# Patient Record
Sex: Male | Born: 1950
Health system: Southern US, Community
[De-identification: ages and names within clinical notes are randomized; demographics above are authoritative.]

## PROBLEM LIST (undated history)

## (undated) ENCOUNTER — Emergency Department (HOSPITAL_COMMUNITY): Admission: EM | Payer: 59 | Source: Home / Self Care

## (undated) DIAGNOSIS — E119 Type 2 diabetes mellitus without complications: Secondary | ICD-10-CM

## (undated) DIAGNOSIS — E039 Hypothyroidism, unspecified: Secondary | ICD-10-CM

## (undated) DIAGNOSIS — I371 Nonrheumatic pulmonary valve insufficiency: Secondary | ICD-10-CM

## (undated) DIAGNOSIS — I10 Essential (primary) hypertension: Secondary | ICD-10-CM

## (undated) DIAGNOSIS — Z954 Presence of other heart-valve replacement: Secondary | ICD-10-CM

## (undated) DIAGNOSIS — E079 Disorder of thyroid, unspecified: Secondary | ICD-10-CM

## (undated) DIAGNOSIS — M1990 Unspecified osteoarthritis, unspecified site: Secondary | ICD-10-CM

## (undated) DIAGNOSIS — I330 Acute and subacute infective endocarditis: Secondary | ICD-10-CM

## (undated) HISTORY — PX: TONSILLECTOMY: SUR1361

## (undated) HISTORY — DX: Nonrheumatic pulmonary valve insufficiency: I37.1

## (undated) HISTORY — PX: COLONOSCOPY: SHX174

## (undated) HISTORY — DX: Acute and subacute infective endocarditis: I33.0

## (undated) HISTORY — PX: APPENDECTOMY: SHX54

---

## 2000-03-09 ENCOUNTER — Emergency Department (HOSPITAL_COMMUNITY): Admission: EM | Admit: 2000-03-09 | Discharge: 2000-03-09 | Payer: Self-pay | Admitting: Emergency Medicine

## 2000-03-09 ENCOUNTER — Encounter: Payer: Self-pay | Admitting: Emergency Medicine

## 2000-03-23 ENCOUNTER — Emergency Department (HOSPITAL_COMMUNITY): Admission: EM | Admit: 2000-03-23 | Discharge: 2000-03-23 | Payer: Self-pay | Admitting: Emergency Medicine

## 2014-02-12 ENCOUNTER — Inpatient Hospital Stay (HOSPITAL_COMMUNITY)
Admission: EM | Admit: 2014-02-12 | Discharge: 2014-02-19 | DRG: 871 | Disposition: A | Discharge status: Home Health | Payer: 59 | Attending: Internal Medicine | Admitting: Internal Medicine

## 2014-02-12 ENCOUNTER — Emergency Department (HOSPITAL_COMMUNITY): Payer: 59

## 2014-02-12 ENCOUNTER — Inpatient Hospital Stay (HOSPITAL_COMMUNITY): Payer: 59

## 2014-02-12 ENCOUNTER — Encounter (HOSPITAL_COMMUNITY): Payer: Self-pay | Admitting: Emergency Medicine

## 2014-02-12 DIAGNOSIS — E876 Hypokalemia: Secondary | ICD-10-CM | POA: Diagnosis present

## 2014-02-12 DIAGNOSIS — N170 Acute kidney failure with tubular necrosis: Secondary | ICD-10-CM | POA: Diagnosis present

## 2014-02-12 DIAGNOSIS — I330 Acute and subacute infective endocarditis: Secondary | ICD-10-CM

## 2014-02-12 DIAGNOSIS — E039 Hypothyroidism, unspecified: Secondary | ICD-10-CM

## 2014-02-12 DIAGNOSIS — E860 Dehydration: Secondary | ICD-10-CM | POA: Diagnosis present

## 2014-02-12 DIAGNOSIS — B953 Streptococcus pneumoniae as the cause of diseases classified elsewhere: Secondary | ICD-10-CM

## 2014-02-12 DIAGNOSIS — N179 Acute kidney failure, unspecified: Secondary | ICD-10-CM | POA: Diagnosis present

## 2014-02-12 DIAGNOSIS — Z79899 Other long term (current) drug therapy: Secondary | ICD-10-CM

## 2014-02-12 DIAGNOSIS — I2690 Septic pulmonary embolism without acute cor pulmonale: Secondary | ICD-10-CM | POA: Diagnosis present

## 2014-02-12 DIAGNOSIS — I10 Essential (primary) hypertension: Secondary | ICD-10-CM | POA: Diagnosis present

## 2014-02-12 DIAGNOSIS — R6520 Severe sepsis without septic shock: Secondary | ICD-10-CM

## 2014-02-12 DIAGNOSIS — R6521 Severe sepsis with septic shock: Secondary | ICD-10-CM

## 2014-02-12 DIAGNOSIS — I2699 Other pulmonary embolism without acute cor pulmonale: Secondary | ICD-10-CM | POA: Diagnosis present

## 2014-02-12 DIAGNOSIS — A403 Sepsis due to Streptococcus pneumoniae: Principal | ICD-10-CM | POA: Diagnosis present

## 2014-02-12 DIAGNOSIS — R222 Localized swelling, mass and lump, trunk: Secondary | ICD-10-CM | POA: Diagnosis present

## 2014-02-12 DIAGNOSIS — D72819 Decreased white blood cell count, unspecified: Secondary | ICD-10-CM | POA: Diagnosis present

## 2014-02-12 DIAGNOSIS — A419 Sepsis, unspecified organism: Secondary | ICD-10-CM | POA: Diagnosis present

## 2014-02-12 DIAGNOSIS — R7881 Bacteremia: Secondary | ICD-10-CM | POA: Diagnosis present

## 2014-02-12 HISTORY — DX: Essential (primary) hypertension: I10

## 2014-02-12 HISTORY — DX: Disorder of thyroid, unspecified: E07.9

## 2014-02-12 HISTORY — DX: Unspecified osteoarthritis, unspecified site: M19.90

## 2014-02-12 HISTORY — DX: Hypothyroidism, unspecified: E03.9

## 2014-02-12 LAB — HEPATIC FUNCTION PANEL
ALT: 24 U/L (ref 0–53)
AST: 35 U/L (ref 0–37)
Albumin: 2.4 g/dL — ABNORMAL LOW (ref 3.5–5.2)
Alkaline Phosphatase: 66 U/L (ref 39–117)
Bilirubin, Direct: <0.2 mg/dL (ref 0.0–0.3)
TOTAL PROTEIN: 6.3 g/dL (ref 6.0–8.3)
Total Bilirubin: 0.5 mg/dL (ref 0.3–1.2)

## 2014-02-12 LAB — URINALYSIS, ROUTINE W REFLEX MICROSCOPIC
GLUCOSE, UA: NEGATIVE mg/dL
Ketones, ur: 15 mg/dL — AB
Leukocytes, UA: NEGATIVE
Nitrite: NEGATIVE
PROTEIN: 100 mg/dL — AB
Specific Gravity, Urine: 1.022 (ref 1.005–1.030)
Urobilinogen, UA: 0.2 mg/dL (ref 0.0–1.0)
pH: 5 (ref 5.0–8.0)

## 2014-02-12 LAB — CBC
HEMATOCRIT: 39.3 % (ref 39.0–52.0)
Hemoglobin: 14 g/dL (ref 13.0–17.0)
MCH: 31 pg (ref 26.0–34.0)
MCHC: 35.6 g/dL (ref 30.0–36.0)
MCV: 86.9 fL (ref 78.0–100.0)
Platelets: 161 10*3/uL (ref 150–400)
RBC: 4.52 MIL/uL (ref 4.22–5.81)
RDW: 13.8 % (ref 11.5–15.5)
WBC: 28.2 10*3/uL — AB (ref 4.0–10.5)

## 2014-02-12 LAB — D-DIMER, QUANTITATIVE (NOT AT ARMC): D DIMER QUANT: 3.42 ug{FEU}/mL — AB (ref 0.00–0.48)

## 2014-02-12 LAB — BASIC METABOLIC PANEL
BUN: 35 mg/dL — AB (ref 6–23)
CO2: 25 mEq/L (ref 19–32)
CREATININE: 1.8 mg/dL — AB (ref 0.50–1.35)
Calcium: 9.2 mg/dL (ref 8.4–10.5)
Chloride: 97 mEq/L (ref 96–112)
GFR calc non Af Amer: 38 mL/min — ABNORMAL LOW (ref >90)
GFR, EST AFRICAN AMERICAN: 44 mL/min — AB (ref >90)
Glucose, Bld: 155 mg/dL — ABNORMAL HIGH (ref 70–99)
Potassium: 3.3 mEq/L — ABNORMAL LOW (ref 3.7–5.3)
Sodium: 139 mEq/L (ref 137–147)

## 2014-02-12 LAB — URINE MICROSCOPIC-ADD ON

## 2014-02-12 LAB — ETHANOL

## 2014-02-12 LAB — TROPONIN I

## 2014-02-12 LAB — I-STAT TROPONIN, ED: Troponin i, poc: 0.02 ng/mL (ref 0.00–0.08)

## 2014-02-12 LAB — PROTIME-INR
INR: 1.31 (ref 0.00–1.49)
Prothrombin Time: 16 seconds — ABNORMAL HIGH (ref 11.6–15.2)

## 2014-02-12 LAB — LIPASE, BLOOD: Lipase: 26 U/L (ref 11–59)

## 2014-02-12 LAB — I-STAT CG4 LACTIC ACID, ED: Lactic Acid, Venous: 2.3 mmol/L — ABNORMAL HIGH (ref 0.5–2.2)

## 2014-02-12 LAB — AMYLASE: AMYLASE: 28 U/L (ref 0–105)

## 2014-02-12 LAB — PRO B NATRIURETIC PEPTIDE: PRO B NATRI PEPTIDE: 1062 pg/mL — AB (ref 0–125)

## 2014-02-12 LAB — PHOSPHORUS: Phosphorus: 2.3 mg/dL (ref 2.3–4.6)

## 2014-02-12 LAB — URIC ACID: URIC ACID, SERUM: 7.9 mg/dL — AB (ref 4.0–7.8)

## 2014-02-12 LAB — TSH: TSH: 7.81 u[IU]/mL — ABNORMAL HIGH (ref 0.350–4.500)

## 2014-02-12 LAB — APTT: APTT: 35 s (ref 24–37)

## 2014-02-12 LAB — CK: Total CK: 547 U/L — ABNORMAL HIGH (ref 7–232)

## 2014-02-12 LAB — MAGNESIUM: MAGNESIUM: 2.2 mg/dL (ref 1.5–2.5)

## 2014-02-12 LAB — PROCALCITONIN: Procalcitonin: >175 ng/mL

## 2014-02-12 MED ORDER — NOREPINEPHRINE BITARTRATE 1 MG/ML IJ SOLN: 2.0000 ug/min | Freq: Once | INTRAVENOUS | Status: DC

## 2014-02-12 MED ORDER — NOREPINEPHRINE BITARTRATE 1 MG/ML IJ SOLN
2.0000 ug/min | Freq: Once | INTRAVENOUS | Status: AC
  Administered 2014-02-12: 5 ug/min via INTRAVENOUS
  Filled 2014-02-12: qty 4

## 2014-02-12 MED ORDER — POTASSIUM CHLORIDE 10 MEQ/100ML IV SOLN
10.0000 meq | INTRAVENOUS | Status: DC
  Administered 2014-02-12: 10 meq via INTRAVENOUS
  Filled 2014-02-12 (×2): qty 100

## 2014-02-12 MED ORDER — PIPERACILLIN-TAZOBACTAM 3.375 G IVPB
3.3750 g | Freq: Three times a day (TID) | INTRAVENOUS | Status: DC
  Administered 2014-02-12 – 2014-02-14 (×5): 3.375 g via INTRAVENOUS
  Filled 2014-02-12 (×9): qty 50

## 2014-02-12 MED ORDER — SODIUM CHLORIDE 0.9 % IV BOLUS (SEPSIS)
1000.0000 mL | Freq: Once | INTRAVENOUS | Status: AC
  Administered 2014-02-12: 1000 mL via INTRAVENOUS

## 2014-02-12 MED ORDER — NOREPINEPHRINE BITARTRATE 1 MG/ML IJ SOLN
2.0000 ug/min | INTRAVENOUS | Status: DC
  Administered 2014-02-12: 5 ug/min via INTRAVENOUS
  Filled 2014-02-12: qty 4

## 2014-02-12 MED ORDER — SODIUM CHLORIDE 0.9 % IV BOLUS (SEPSIS): 2000.0000 mL | Freq: Once | INTRAVENOUS | Status: DC

## 2014-02-12 MED ORDER — DEXTROSE 5 % IV SOLN
1.0000 g | Freq: Once | INTRAVENOUS | Status: AC
  Administered 2014-02-12: 1 g via INTRAVENOUS
  Filled 2014-02-12: qty 10

## 2014-02-12 MED ORDER — SODIUM CHLORIDE 0.9 % IV BOLUS (SEPSIS)
2000.0000 mL | Freq: Once | INTRAVENOUS | Status: AC
  Administered 2014-02-13: 2000 mL via INTRAVENOUS

## 2014-02-12 MED ORDER — DEXTROSE 5 % IV SOLN
500.0000 mg | Freq: Once | INTRAVENOUS | Status: AC
  Administered 2014-02-12: 500 mg via INTRAVENOUS

## 2014-02-12 MED ORDER — SODIUM CHLORIDE 0.9 % IV SOLN: 250.0000 mL | INTRAVENOUS | Status: DC | PRN

## 2014-02-12 MED ORDER — HEPARIN SODIUM (PORCINE) 5000 UNIT/ML IJ SOLN
5000.0000 [IU] | Freq: Three times a day (TID) | INTRAMUSCULAR | Status: DC
  Administered 2014-02-13 – 2014-02-16 (×11): 5000 [IU] via SUBCUTANEOUS
  Filled 2014-02-12 (×14): qty 1

## 2014-02-12 MED ORDER — FAMOTIDINE IN NACL 20-0.9 MG/50ML-% IV SOLN
20.0000 mg | Freq: Two times a day (BID) | INTRAVENOUS | Status: DC
  Administered 2014-02-13: 20 mg via INTRAVENOUS
  Filled 2014-02-12 (×3): qty 50

## 2014-02-12 MED ORDER — ACETAMINOPHEN 325 MG PO TABS
650.0000 mg | ORAL_TABLET | Freq: Once | ORAL | Status: AC
  Administered 2014-02-12: 650 mg via ORAL
  Filled 2014-02-12: qty 2

## 2014-02-12 NOTE — H&P (Signed)
PULMONARY / CRITICAL CARE MEDICINE   Name: Jeffrey Guerrero MRN: [id number] DOB: Feb 23, 1951    ADMISSION DATE:  02/12/2014 CONSULTATION DATE:  02/12/14  REFERRING MD :  ED PRIMARY SERVICE: PCCM  CHIEF COMPLAINT:  Syncope, concern for sepsis  BRIEF PATIENT DESCRIPTION: 63 y.o. M, presented to ED after syncopal episode at home.   Initially stable; however, became hypotensive and febrile while in ED.  Code sepsis called, PCCM asked to admit.  SIGNIFICANT EVENTS / STUDIES:  4/27 - admit.  4/27- Head CT  >>> neg. 4/27-  Abd CT >>> neg  LINES / TUBES: PIV 4/27>>> CVL 4/27>>> A line 4/27>>>  CULTURES: Blood 4/27 >>> Urine 4/27 >>> Respiratory visus panel>>> sputum culture 4/27>>> Urine legionella Urine strep  ANTIBIOTICS: Vanc 4/27 >>>  Zosyn 4/27 >>> Rocephin 4/27>>> one dose Azithromycine 4/27>>> one dose   HISTORY OF PRESENT ILLNESS:   Jeffrey Guerrero is a 63 y.o. M with PMH of HTN and hypothyroidism, who presented 4/27 after a syncopal episode at home.  He states that he was in his normal state of health and was reaching for something in his refrigerator when he suddenly had syncopal episode with positive LOC.  Daughter proceeded to bring him to the ED for further evaluation. Patient did not lose control of bladder or bowel movement. Denies shortness of breath, chest pain. She has a mild cough without sputum production. No symptoms for UTI. No nausea, vomiting, diarrhea or abdominal pain. In ED, head CT was negative for acute process.  Pt was initially stable, but then he became hypotensive and febrile to 103.  BP did not respond to fluids and Levophed was started, code sepsis called. PCCM was asked for admission.    PAST MEDICAL HISTORY :  Past Medical History  Diagnosis Date  . Hypertension   . Thyroid disease    History reviewed. No pertinent past surgical history. Prior to Admission medications   Medication Sig Start Date End Date Taking? Authorizing Provider  amLODipine  (NORVASC) 5 MG tablet Take 5 mg by mouth daily.   Yes Historical Provider, MD  levothyroxine (SYNTHROID, LEVOTHROID) 100 MCG tablet Take 100 mcg by mouth daily before breakfast.   Yes Historical Provider, MD  Vitamin D, Ergocalciferol, (DRISDOL) 50000 UNITS CAPS capsule Take 50,000 Units by mouth every 7 (seven) days. Wednesday   Yes Historical Provider, MD   No Known Allergies  FAMILY HISTORY:  History reviewed. No pertinent family history. SOCIAL HISTORY:  reports that he has never smoked. He does not have any smokeless tobacco history on file. He reports that he drinks alcohol. He reports that he does not use illicit drugs.  REVIEW OF SYSTEMS:   ROS: General: has fever. Skin: no rash HEENT: no blurry vision, hearing changes or sore throat Pulm: has mild cough, no dyspnea or wheezing CV: no chest pain, palpitations, shortness of breath Abd: no nausea/vomiting, abdominal pain, diarrhea/constipation GU: no dysuria, hematuria, polyuria Ext: no arthralgias, myalgias Neuro: no weakness, numbness, or tingling  SUBJECTIVE:   VITAL SIGNS: Temp:  [97.6 F (36.4 C)-102.9 F (39.4 C)] 102.9 F (39.4 C) (04/27 1907) Pulse Rate:  [78-110] 79 (04/27 2200) Resp:  [16-30] 26 (04/27 2200) BP: (71-108)/(45-67) 91/62 mmHg (04/27 2200) SpO2:  [93 %-98 %] 96 % (04/27 2200) Weight:  [86.807 kg (191 lb 6 oz)] 86.807 kg (191 lb 6 oz) (04/27 1248) HEMODYNAMICS:   VENTILATOR SETTINGS:   INTAKE / OUTPUT: Intake/Output     04/27 0701 - 04/28 0700  I.V. (mL/kg) 3100 (35.7)   Total Intake(mL/kg) 3100 (35.7)   Net +3100         PHYSICAL EXAMINATION: General: NAD Neuro: oriented, Nonfocal, negative Brudzinski and Kernig sign, negative Babinski's HEENT: No JVD, no carotid artery bruit Cardiovascular: S1/S2, regular rhythm and rate, no murmur Lungs: To auscultation bilaterally Abdomen: Soft, nondistended, nontender, active bowel sounds Musculoskeletal: No leg edema or joint pain Skin: no  rashes  LABS:  - updated 6.55am 02/13/14 PULMONARY No results found for this basename: PHART, PCO2, PCO2ART, PO2, PO2ART, HCO3, TCO2, O2SAT,  in the last 168 hours  CBC  Recent Labs Lab 02/12/14 1304 02/13/14 0500  HGB 14.0 11.6*  HCT 39.3 33.4*  WBC 28.2* 21.9*  PLT 161 130*    COAGULATION  Recent Labs Lab 02/12/14 2212  INR 1.31    CARDIAC   Recent Labs Lab 02/12/14 2212 02/13/14 0412  TROPONINI <0.30 <0.30    Recent Labs Lab 02/12/14 2212  PROBNP 1062.0*     CHEMISTRY  Recent Labs Lab 02/12/14 1304 02/12/14 2212 02/13/14 0500  NA 139  --  138  K 3.3*  --  3.5*  CL 97  --  105  CO2 25  --  21  GLUCOSE 155*  --  157*  BUN 35*  --  32*  CREATININE 1.80*  --  1.39*  CALCIUM 9.2  --  7.3*  MG  --  2.2 2.3  PHOS  --  2.3 2.2*   Estimated Creatinine Clearance: 60.7 ml/min (by C-G formula based on Cr of 1.39).   LIVER  Recent Labs Lab 02/12/14 2212  AST 35  ALT 24  ALKPHOS 66  BILITOT 0.5  PROT 6.3  ALBUMIN 2.4*  INR 1.31     INFECTIOUS  Recent Labs Lab 02/12/14 1807 02/12/14 2212 02/13/14 0130  LATICACIDVEN 2.30*  --  1.1  PROCALCITON  --  >175.00  --      ENDOCRINE CBG (last 3)   Recent Labs  02/12/14 2313  GLUCAP 162*         IMAGING x48h  Ct Abdomen Pelvis Wo Contrast  02/12/2014   CLINICAL DATA:  Evaluate abdominal aortic aneurysm  EXAM: CT ABDOMEN AND PELVIS WITHOUT CONTRAST  TECHNIQUE: Multidetector CT imaging of the abdomen and pelvis was performed following the standard protocol without intravenous contrast.  COMPARISON:  None.  FINDINGS: The caliber of the abdominal aorta is normal and exhibits a normal tapered contour. There is mural calcification in the infrarenal aorta and common iliac arteries. The maximal diameter of the right common iliac artery is 1.5 cm. The maximal diameter of the left common iliac artery is 1.3 cm. The periaortic and pericaval soft tissues are normal in appearance.  The liver,  gallbladder, pancreas, spleen, and adrenal glands are normal in appearance. The kidneys exhibit no evidence of obstruction or of calcified stones. In the lateral aspect of the upper pole of the left kidney there is a hypodensity measuring approximately 1.8 x 2.1 cm which exhibits Hounsfield measurement of -4 and likely reflects a cyst. The perinephric fat is normal in density. The non-opacified loops of small and large bowel exhibit no evidence of ileus or obstruction or acute inflammation. A normal calibered, uninflamed-appearing appendix is demonstrated on images 64-70 of series 2. Within the pelvis the urinary bladder, prostate gland, and seminal vesicles exhibit no acute abnormalities. There is no inguinal nor significant umbilical hernia. The psoas musculature is normal in appearance. The lumbar vertebral bodies are preserved in  height. There are bilateral pars defects at L5 without evidence of spondylolisthesis. The bony pelvis exhibits no acute abnormalities. The lung bases are clear.  IMPRESSION: 1. There is no evidence of an abdominal aortic aneurysm. No iliac artery aneurysm or splenic artery aneurysm is demonstrated. The periaortic and pericaval regions appear normal. 2. There is no acute hepatobiliary abnormality nor acute urinary tract abnormality. A hypodensity in the upper pole of the left kidney likely reflects a cyst. 3. There is no acute bowel abnormality. 4. There are bilateral pars defects at L5 without evidence of spondylolisthesis.   Electronically Signed   By: David  Martinique   On: 02/12/2014 19:03   Dg Chest 2 View  02/12/2014   CLINICAL DATA:  STATUS POST FALL EARLIER TODAY, LOSS CONSCIOUSNESS  EXAM: CHEST  2 VIEW  COMPARISON:  None.  FINDINGS: The heart size and mediastinal contours are within normal limits. Both lungs are clear. The visualized skeletal structures are unremarkable.  IMPRESSION: No active cardiopulmonary disease.   Electronically Signed   By: Kathreen Devoid   On: 02/12/2014  18:51   Ct Head Wo Contrast  02/12/2014   CLINICAL DATA:  Loss of consciousness.  EXAM: CT HEAD WITHOUT CONTRAST  TECHNIQUE: Contiguous axial images were obtained from the base of the skull through the vertex without contrast.  COMPARISON:  None  FINDINGS: No evidence for acute hemorrhage, mass lesion, midline shift, hydrocephalus or large infarct. The visualized sinuses are clear. No acute bone abnormality.  IMPRESSION: No acute intracranial abnormality.   Electronically Signed   By: Markus Daft M.D.   On: 02/12/2014 16:05      ASSESSMENT / PLAN:  PULMONARY A: - mild cough, no sputum production-->aspiration?  - Negative chest x-ray  P:   -monitor closely  CARDIOVASCULAR A: - hx of hypertension, on amlodipine at home--> now septic shock - No chest pain or SOB - EKG: S1Q3T3 patter-->nonspecific for PE?  - D dimer 3.42-->no chest pain, normal O2 saturation--> less likely to have PE - 2E echo done in ED-->no right heart straining or pericardial effusion   - sTaff MD: very labile BP. OFf pressors as of 7am 02/13/14. S/p 6L fluids so far   P:  - Hold home amlodipine - Aggressive fluid resuscitation: 6 L NS followed by NS 150 cc/h (staff MD: give more fluids if needed) - Troponin cycle - LE doppler to rule out dvt - Levophed gtt (staff note: off shortly after arrival in ICU) - insert A line and CVL - check cortisol level  RENAL A:   Mild Acute Renal Insufficiency with mild high CK - No known hx of renal issue-->now AKI, BUN 35 and cre 1.80-->likely pre-renal vs ATN secondary to shock - No hydronephrosis on CT apt - HypoK, K=3.3  -UA negative for UTI, but positive RBC--> myoglobinuria?  P:   - replete K - check FeNa - check CK to rule out rhabdomyolysis - monitor CPK (Staff MD Note)  GASTROINTESTINAL A:   - No hx of GI issues P:   - Pepcide for PPx  HEMATOLOGIC A:   - Leukocytosis with WBC 28.2 in the setting of septic shock  P:  - F/u CBC - antibiotis as  below  INFECTIOUS A:  Septic Shock definite without source - no clear source of infection except for mild cough-->possible aspiration secondary to syncope - elevated lactic acid - PCT 175 P:   - IV zosyn and Vancomycin - start solumedrol q6h and pending cortisol level -  pan-culture ordered - check respiratory virus panel, HIV, UDS,  - trend lactic acid   ENDOCRINE A:   - No history of diabetes - History of hypothyroidism, TSH 7.810 on admission, in the acute illness, TSH may not be not accurate P:   - continue home Synthroid - monitor CBG, goal 140 to 180 - check cortisol level  NEUROLOGIC A:   - syncope-->unclear etiology, likely due to vasovagal or Orthostatic status. - no sign of meningitis or infectious encephalitis - CT head negative P:   - Monitor closely for mental stautus - monitor on tele for arhythmia? - neuro check q4h   Ivor Costa, MD PGY3, Internal Medicine Teaching Service Pager: [phone] 02/12/2014, 10:10 PM   STAFF NOTE: I, Dr Ann Lions have personally reviewed patient's available data, including medical history, events of note, physical examination and test results as part of my evaluation. I have discussed with resident/NP and other care providers such as pharmacist, RN and RRT.  In addition,  I personally evaluated patient and elicited key findings of septic shock, with mild acute renal insuffiency. Off pressors this morning. Mild high CPK will recheck. Mild renal insuf improving. Concerning for bacteremia; will repeat blood culture 02/13/14 am  Rest per NP/medical resident whose note is outlined above and that I agree with  The patient is critically ill with multiple organ systems failure and requires high complexity decision making for assessment and support, frequent evaluation and titration of therapies, application of advanced monitoring technologies and extensive interpretation of multiple databases.   Critical Care Time devoted to patient care  services described in this note is  35  Minutes.  Dr. Brand Males, M.D., [facility].C.P Pulmonary and Critical Care Medicine Staff Physician [facility] Pager: [phone], If no answer or between  15:00h - 7:00h: call [phone]  02/13/2014 7:01 AM

## 2014-02-12 NOTE — Progress Notes (Signed)
ANTIBIOTIC CONSULT NOTE - INITIAL  Pharmacy Consult for Zosyn Indication: possible aspiration pneumonia  No Known Allergies  Patient Measurements: Height: 6\' 1"  (185.4 cm) Weight: 191 lb 6 oz (86.807 kg) IBW/kg (Calculated) : 79.9  Vital Signs: Temp: 102.9 F (39.4 C) (04/27 1907) Temp src: Rectal (04/27 1907) BP: 91/62 mmHg (04/27 2200) Pulse Rate: 79 (04/27 2200) Intake/Output from previous day:   Intake/Output from this shift: Total I/O In: 3100 [I.V.:3100] Out: -   Labs:  Recent Labs  02/12/14 1304  WBC 28.2*  HGB 14.0  PLT 161  CREATININE 1.80*   Estimated Creatinine Clearance: 46.9 ml/min (by C-G formula based on Cr of 1.8). No results found for this basename: VANCOTROUGH, VANCOPEAK, VANCORANDOM, GENTTROUGH, GENTPEAK, GENTRANDOM, TOBRATROUGH, TOBRAPEAK, TOBRARND, AMIKACINPEAK, AMIKACINTROU, AMIKACIN,  in the last 72 hours   Microbiology: No results found for this or any previous visit (from the past 720 hour(s)).  Medical History: Past Medical History  Diagnosis Date  . Hypertension   . Thyroid disease     Medications:  See home med list Assessment: 63 year old man to be admitted to the hospital for sepsis, possible aspiration pneumonia.  Zosyn to start empirically.  Goal of Therapy:  treat infection  Plan:  Zosyn 3.375g IV q8h (infuse over 4 hours) Follow cultures and renal function  Gearldine Bienenstock Krystena Reitter 02/12/2014,10:24 PM

## 2014-02-12 NOTE — ED Notes (Signed)
Dr. Wyvonnia Dusky at the bedside. Verbalized need to start Levophed. Patient responsive. Denies pain, dizziness, or lightheadedness at the time.

## 2014-02-12 NOTE — ED Notes (Signed)
Rancour, Beaver, and Admitting Resident at the bedside.

## 2014-02-12 NOTE — ED Notes (Signed)
Pt reports that he thinks that he passed out this morning at home. States that he was getting some ice out of the freezer and woke up on the floor. States that he went to his PCP and was sent here. Pt with laceration between the eyes. Denies any headache. Skin warm and dry. A&Ox4.

## 2014-02-12 NOTE — ED Notes (Signed)
Resident Modena Nunnery verbalized to wait on Orthostatics till patient is stable. MD made aware of pressures and verbalized to keep watching patient.

## 2014-02-12 NOTE — ED Notes (Signed)
Patient transported to CT 

## 2014-02-12 NOTE — ED Provider Notes (Signed)
CSN: [id number]     Arrival date & time 02/12/14  1235 History   First MD Initiated Contact with Patient 02/12/14 1717     Chief Complaint  Patient presents with  . Loss of Consciousness     (Consider location/radiation/quality/duration/timing/severity/associated sxs/prior Treatment) The history is provided by the patient.   history of present illness: 63 year old male with history of hypertension and hypothyroidism who presents for a possible syncopal episode this morning.  Patient reports she was standing at his refrigerator and was about to get something out when he felt himself fall backwards.  He reports loss of consciousness.  He does not know how long it was until he woke up.  Patient's only complaint is that he has been feeling "tired" and he is thirsty.  He currently denies any fevers, chest pain, shortness of breath, abdominal pain, nausea, vomiting, diarrhea, constipation, dysuria.  Past Medical History  Diagnosis Date  . Hypertension   . Thyroid disease    History reviewed. No pertinent past surgical history. History reviewed. No pertinent family history. History  Substance Use Topics  . Smoking status: Never Smoker   . Smokeless tobacco: Not on file  . Alcohol Use: Yes    Review of Systems  Constitutional: Positive for fatigue. Negative for fever and chills.  HENT: Negative.   Eyes: Negative.   Respiratory: Negative for cough and shortness of breath.   Cardiovascular: Negative for chest pain and palpitations.  Gastrointestinal: Negative for nausea, vomiting, abdominal pain, diarrhea and constipation.  Genitourinary: Negative.   Musculoskeletal: Negative.   Skin: Negative.   Neurological: Positive for syncope.  All other systems reviewed and are negative.     Allergies  Review of patient's allergies indicates no known allergies.  Home Medications   Prior to Admission medications   Medication Sig Start Date End Date Taking? Authorizing Provider  amLODipine  (NORVASC) 5 MG tablet Take 5 mg by mouth daily.   Yes Historical Provider, MD  levothyroxine (SYNTHROID, LEVOTHROID) 100 MCG tablet Take 100 mcg by mouth daily before breakfast.   Yes Historical Provider, MD  Vitamin D, Ergocalciferol, (DRISDOL) 50000 UNITS CAPS capsule Take 50,000 Units by mouth every 7 (seven) days. Wednesday   Yes Historical Provider, MD   BP 90/58  Pulse 73  Temp(Src) 98.4 F (36.9 C) (Oral)  Resp 25  Ht 6\' 1"  (1.854 m)  Wt 191 lb 6 oz (86.807 kg)  BMI 25.25 kg/m2  SpO2 97% Physical Exam  Nursing note and vitals reviewed. Constitutional: He is oriented to person, place, and time. He appears well-developed and well-nourished. No distress.  HENT:  Head: Normocephalic and atraumatic.  Eyes: Conjunctivae are normal.  Neck: Neck supple.  Cardiovascular: Regular rhythm, normal heart sounds and intact distal pulses.  Tachycardia present.   Pulmonary/Chest: Effort normal and breath sounds normal. He has no wheezes. He has no rales.  Abdominal: Soft. He exhibits no distension. There is no tenderness.  Musculoskeletal: Normal range of motion.  Neurological: He is alert and oriented to person, place, and time.  Skin: Skin is warm and dry.    ED Course  Procedures (including critical care time) Labs Review Labs Reviewed  CBC - Abnormal; Notable for the following:    WBC 28.2 (*)    All other components within normal limits  BASIC METABOLIC PANEL - Abnormal; Notable for the following:    Potassium 3.3 (*)    Glucose, Bld 155 (*)    BUN 35 (*)    Creatinine, Ser  1.80 (*)    GFR calc non Af Amer 38 (*)    GFR calc Af Amer 44 (*)    All other components within normal limits  URINALYSIS, ROUTINE W REFLEX MICROSCOPIC - Abnormal; Notable for the following:    Color, Urine AMBER (*)    APPearance CLOUDY (*)    Hgb urine dipstick LARGE (*)    Bilirubin Urine SMALL (*)    Ketones, ur 15 (*)    Protein, ur 100 (*)    All other components within normal limits  TSH -  Abnormal; Notable for the following:    TSH 7.810 (*)    All other components within normal limits  D-DIMER, QUANTITATIVE - Abnormal; Notable for the following:    D-Dimer, Quant 3.42 (*)    All other components within normal limits  URINE MICROSCOPIC-ADD ON - Abnormal; Notable for the following:    Bacteria, UA FEW (*)    Casts HYALINE CASTS (*)    All other components within normal limits  I-STAT CG4 LACTIC ACID, ED - Abnormal; Notable for the following:    Lactic Acid, Venous 2.30 (*)    All other components within normal limits  CULTURE, BLOOD (ROUTINE X 2)  CULTURE, BLOOD (ROUTINE X 2)  URINE CULTURE  RESPIRATORY VIRUS PANEL  CULTURE, EXPECTORATED SPUTUM-ASSESSMENT  MAGNESIUM  PHOSPHORUS  AMYLASE  LIPASE, BLOOD  TROPONIN I  TROPONIN I  TROPONIN I  PROCALCITONIN  CORTISOL  PRO B NATRIURETIC PEPTIDE  PROTIME-INR  APTT  BASIC METABOLIC PANEL  MAGNESIUM  PHOSPHORUS  CBC  MYOGLOBIN, SERUM  HEPATIC FUNCTION PANEL  CK  SODIUM, URINE, RANDOM  CREATININE, URINE, RANDOM  LACTIC ACID, PLASMA  URINE RAPID DRUG SCREEN (HOSP PERFORMED)  ETHANOL  HIV ANTIBODY (ROUTINE TESTING)  URIC ACID  I-STAT TROPOININ, ED    Imaging Review Ct Abdomen Pelvis Wo Contrast  02/12/2014   CLINICAL DATA:  Evaluate abdominal aortic aneurysm  EXAM: CT ABDOMEN AND PELVIS WITHOUT CONTRAST  TECHNIQUE: Multidetector CT imaging of the abdomen and pelvis was performed following the standard protocol without intravenous contrast.  COMPARISON:  None.  FINDINGS: The caliber of the abdominal aorta is normal and exhibits a normal tapered contour. There is mural calcification in the infrarenal aorta and common iliac arteries. The maximal diameter of the right common iliac artery is 1.5 cm. The maximal diameter of the left common iliac artery is 1.3 cm. The periaortic and pericaval soft tissues are normal in appearance.  The liver, gallbladder, pancreas, spleen, and adrenal glands are normal in appearance. The  kidneys exhibit no evidence of obstruction or of calcified stones. In the lateral aspect of the upper pole of the left kidney there is a hypodensity measuring approximately 1.8 x 2.1 cm which exhibits Hounsfield measurement of -4 and likely reflects a cyst. The perinephric fat is normal in density. The non-opacified loops of small and large bowel exhibit no evidence of ileus or obstruction or acute inflammation. A normal calibered, uninflamed-appearing appendix is demonstrated on images 64-70 of series 2. Within the pelvis the urinary bladder, prostate gland, and seminal vesicles exhibit no acute abnormalities. There is no inguinal nor significant umbilical hernia. The psoas musculature is normal in appearance. The lumbar vertebral bodies are preserved in height. There are bilateral pars defects at L5 without evidence of spondylolisthesis. The bony pelvis exhibits no acute abnormalities. The lung bases are clear.  IMPRESSION: 1. There is no evidence of an abdominal aortic aneurysm. No iliac artery aneurysm or splenic artery  aneurysm is demonstrated. The periaortic and pericaval regions appear normal. 2. There is no acute hepatobiliary abnormality nor acute urinary tract abnormality. A hypodensity in the upper pole of the left kidney likely reflects a cyst. 3. There is no acute bowel abnormality. 4. There are bilateral pars defects at L5 without evidence of spondylolisthesis.   Electronically Signed   By: David  Martinique   On: 02/12/2014 19:03   Dg Chest 2 View  02/12/2014   CLINICAL DATA:  STATUS POST FALL EARLIER TODAY, LOSS CONSCIOUSNESS  EXAM: CHEST  2 VIEW  COMPARISON:  None.  FINDINGS: The heart size and mediastinal contours are within normal limits. Both lungs are clear. The visualized skeletal structures are unremarkable.  IMPRESSION: No active cardiopulmonary disease.   Electronically Signed   By: Kathreen Devoid   On: 02/12/2014 18:51   Ct Head Wo Contrast  02/12/2014   CLINICAL DATA:  Loss of  consciousness.  EXAM: CT HEAD WITHOUT CONTRAST  TECHNIQUE: Contiguous axial images were obtained from the base of the skull through the vertex without contrast.  COMPARISON:  None  FINDINGS: No evidence for acute hemorrhage, mass lesion, midline shift, hydrocephalus or large infarct. The visualized sinuses are clear. No acute bone abnormality.  IMPRESSION: No acute intracranial abnormality.   Electronically Signed   By: Markus Daft M.D.   On: 02/12/2014 16:05     EKG Interpretation   Date/Time:  Monday February 12 2014 12:51:53 EDT Ventricular Rate:  87 PR Interval:  152 QRS Duration: 92 QT Interval:  366 QTC Calculation: 440 R Axis:   74 Text Interpretation:  Normal sinus rhythm Possible Left atrial enlargement  Nonspecific ST abnormality Abnormal ECG No previous ECGs available  Confirmed by Wyvonnia Dusky  MD, STEPHEN [phone]) on 02/12/2014 6:03:39 PM      MDM   Final diagnoses:  Severe sepsis    63 year old male with history of hypertension and hypothyroidism who presents for a possible episode of syncope that occurred this morning.  Patient only complaining that he has felt increasingly tired.  He denies any other complaints.  At time of exam patient was slightly tachycardic with blood pressure 95/54.  Rectal temperature was obtained that showed patient to be febrile at 102.9.  Patient appeared dehydrated.  Labs obtained through triage were remarkable for a leukocytosis of 28,000.  Lactic acid 2.3.  CT of the head was negative.  Due to hypotension, CT of the abdomen was obtained that showed no signs of aneurysm or other acute abnormality.  UA negative for UTI.  Chest x-ray negative.  Patient now reporting some history of cough.  Concern for possible respiratory source and patient not showing signs on chest x-ray due to his dehydration.  Patient received total of 3 L IVF while in the emergency department.  Blood pressures continued to trend down to 72/55.  Patient appeared volume resuscitated at  that point and he was started on Levophed.    Patient was given Rocephin and azithromycin to cover for possible community-acquired pneumonia.  There was delay in patient receiving antibiotics do to patient developing sepsis while in the emergency department.  Critical care was consulted and will admit for further management.    Renaldo Reel, MD 02/12/14 2303

## 2014-02-12 NOTE — ED Notes (Signed)
Ordered to hold off on Orthostatic vital signs because pt blood pressure is low.

## 2014-02-12 NOTE — ED Notes (Signed)
Admitting MD at the bedside with the Resident gaining assessment.

## 2014-02-12 NOTE — ED Notes (Signed)
Pt vss reassessed, acuity changed dt elevated white count and low BP. PT remains AO. Denies being dizzy or lightheaded while in waiting area. Pt accompanied by family.

## 2014-02-13 ENCOUNTER — Inpatient Hospital Stay (HOSPITAL_COMMUNITY): Payer: 59

## 2014-02-13 ENCOUNTER — Encounter (HOSPITAL_COMMUNITY): Payer: Self-pay | Admitting: General Practice

## 2014-02-13 DIAGNOSIS — R6521 Severe sepsis with septic shock: Secondary | ICD-10-CM

## 2014-02-13 DIAGNOSIS — A419 Sepsis, unspecified organism: Secondary | ICD-10-CM

## 2014-02-13 DIAGNOSIS — N19 Unspecified kidney failure: Secondary | ICD-10-CM

## 2014-02-13 DIAGNOSIS — R6520 Severe sepsis without septic shock: Secondary | ICD-10-CM

## 2014-02-13 DIAGNOSIS — I369 Nonrheumatic tricuspid valve disorder, unspecified: Secondary | ICD-10-CM

## 2014-02-13 LAB — CK TOTAL AND CKMB (NOT AT ARMC)
CK, MB: 4.4 ng/mL — ABNORMAL HIGH (ref 0.3–4.0)
Relative Index: 0.9 (ref 0.0–2.5)
Total CK: 493 U/L — ABNORMAL HIGH (ref 7–232)

## 2014-02-13 LAB — MRSA PCR SCREENING: MRSA by PCR: NEGATIVE

## 2014-02-13 LAB — MAGNESIUM: MAGNESIUM: 2.3 mg/dL (ref 1.5–2.5)

## 2014-02-13 LAB — RAPID URINE DRUG SCREEN, HOSP PERFORMED
AMPHETAMINES: NOT DETECTED
Barbiturates: NOT DETECTED
Benzodiazepines: NOT DETECTED
Cocaine: NOT DETECTED
Opiates: NOT DETECTED
Tetrahydrocannabinol: NOT DETECTED

## 2014-02-13 LAB — DIFFERENTIAL
BASOS ABS: 0 10*3/uL (ref 0.0–0.1)
BASOS PCT: 0 % (ref 0–1)
EOS PCT: 0 % (ref 0–5)
Eosinophils Absolute: 0.1 10*3/uL (ref 0.0–0.7)
LYMPHS PCT: 4 % — AB (ref 12–46)
Lymphs Abs: 1 10*3/uL (ref 0.7–4.0)
MONOS PCT: 8 % (ref 3–12)
Monocytes Absolute: 2 10*3/uL — ABNORMAL HIGH (ref 0.1–1.0)
NEUTROS PCT: 88 % — AB (ref 43–77)
Neutro Abs: 23.8 10*3/uL — ABNORMAL HIGH (ref 1.7–7.7)
WBC Morphology: INCREASED

## 2014-02-13 LAB — PHOSPHORUS: PHOSPHORUS: 2.2 mg/dL — AB (ref 2.3–4.6)

## 2014-02-13 LAB — BASIC METABOLIC PANEL
BUN: 32 mg/dL — ABNORMAL HIGH (ref 6–23)
CO2: 21 mEq/L (ref 19–32)
Calcium: 7.3 mg/dL — ABNORMAL LOW (ref 8.4–10.5)
Chloride: 105 mEq/L (ref 96–112)
Creatinine, Ser: 1.39 mg/dL — ABNORMAL HIGH (ref 0.50–1.35)
GFR calc Af Amer: 60 mL/min — ABNORMAL LOW (ref >90)
GFR calc non Af Amer: 52 mL/min — ABNORMAL LOW (ref >90)
Glucose, Bld: 157 mg/dL — ABNORMAL HIGH (ref 70–99)
Potassium: 3.5 mEq/L — ABNORMAL LOW (ref 3.7–5.3)
SODIUM: 138 meq/L (ref 137–147)

## 2014-02-13 LAB — HIV ANTIBODY (ROUTINE TESTING W REFLEX): HIV: NONREACTIVE

## 2014-02-13 LAB — CBC
HCT: 33.4 % — ABNORMAL LOW (ref 39.0–52.0)
Hemoglobin: 11.6 g/dL — ABNORMAL LOW (ref 13.0–17.0)
MCH: 30.4 pg (ref 26.0–34.0)
MCHC: 34.7 g/dL (ref 30.0–36.0)
MCV: 87.4 fL (ref 78.0–100.0)
Platelets: 130 10*3/uL — ABNORMAL LOW (ref 150–400)
RBC: 3.82 MIL/uL — ABNORMAL LOW (ref 4.22–5.81)
RDW: 14.2 % (ref 11.5–15.5)
WBC: 21.9 10*3/uL — ABNORMAL HIGH (ref 4.0–10.5)

## 2014-02-13 LAB — GLUCOSE, CAPILLARY: Glucose-Capillary: 162 mg/dL — ABNORMAL HIGH (ref 70–99)

## 2014-02-13 LAB — SODIUM, URINE, RANDOM: SODIUM UR: 23 meq/L

## 2014-02-13 LAB — TROPONIN I: Troponin I: <0.3 ng/mL (ref <0.30)

## 2014-02-13 LAB — LACTIC ACID, PLASMA: LACTIC ACID, VENOUS: 1.1 mmol/L (ref 0.5–2.2)

## 2014-02-13 LAB — CREATININE, URINE, RANDOM: CREATININE, URINE: 140.47 mg/dL

## 2014-02-13 LAB — PROCALCITONIN: Procalcitonin: 141.17 ng/mL

## 2014-02-13 LAB — CORTISOL: CORTISOL PLASMA: 38.4 ug/dL

## 2014-02-13 MED ORDER — LEVOTHYROXINE SODIUM 100 MCG PO TABS: 100.0000 ug | ORAL_TABLET | Freq: Every day | ORAL | Status: DC

## 2014-02-13 MED ORDER — POTASSIUM PHOSPHATE DIBASIC 3 MMOLE/ML IV SOLN
10.0000 mmol | Freq: Once | INTRAVENOUS | Status: AC
  Administered 2014-02-13: 10 mmol via INTRAVENOUS
  Filled 2014-02-13: qty 3.33

## 2014-02-13 MED ORDER — SODIUM CHLORIDE 0.9 % IV SOLN
250.0000 mL | INTRAVENOUS | Status: DC
  Administered 2014-02-13: 14:00:00 via INTRAVENOUS
  Administered 2014-02-14: 1000 mL via INTRAVENOUS
  Administered 2014-02-15: 250 mL via INTRAVENOUS

## 2014-02-13 MED ORDER — LEVOTHYROXINE SODIUM 100 MCG PO TABS
100.0000 ug | ORAL_TABLET | Freq: Every day | ORAL | Status: DC
  Administered 2014-02-13 – 2014-02-17 (×5): 100 ug via ORAL
  Filled 2014-02-13 (×7): qty 1

## 2014-02-13 MED ORDER — VANCOMYCIN HCL IN DEXTROSE 750-5 MG/150ML-% IV SOLN
750.0000 mg | Freq: Once | INTRAVENOUS | Status: AC
  Administered 2014-02-13: 750 mg via INTRAVENOUS
  Filled 2014-02-13: qty 150

## 2014-02-13 MED ORDER — METHYLPREDNISOLONE SODIUM SUCC 125 MG IJ SOLR
60.0000 mg | Freq: Four times a day (QID) | INTRAMUSCULAR | Status: DC
  Administered 2014-02-13 (×2): 60 mg via INTRAVENOUS
  Filled 2014-02-13 (×7): qty 0.96

## 2014-02-13 MED ORDER — VITAMIN D (ERGOCALCIFEROL) 1.25 MG (50000 UNIT) PO CAPS
50000.0000 [IU] | ORAL_CAPSULE | ORAL | Status: DC
  Administered 2014-02-17: 50000 [IU] via ORAL
  Filled 2014-02-13: qty 1

## 2014-02-13 MED ORDER — POTASSIUM CHLORIDE CRYS ER 20 MEQ PO TBCR
40.0000 meq | EXTENDED_RELEASE_TABLET | Freq: Once | ORAL | Status: AC
  Administered 2014-02-13: 40 meq via ORAL
  Filled 2014-02-13: qty 2

## 2014-02-13 MED ORDER — VANCOMYCIN HCL IN DEXTROSE 750-5 MG/150ML-% IV SOLN
750.0000 mg | Freq: Two times a day (BID) | INTRAVENOUS | Status: DC
  Administered 2014-02-13 – 2014-02-15 (×4): 750 mg via INTRAVENOUS
  Filled 2014-02-13 (×6): qty 150

## 2014-02-13 NOTE — Procedures (Signed)
Arterial Catheter Insertion Procedure Note Pruitt Taboada [id number] [birth year]  Procedure: Insertion of Arterial Catheter  Indications: Blood pressure monitoring and Frequent blood sampling  Procedure Details Consent: Risks of procedure as well as the alternatives and risks of each were explained to the (patient/caregiver).  Consent for procedure obtained. Time Out: Verified patient identification, verified procedure, site/side was marked, verified correct patient position, special equipment/implants available, medications/allergies/relevent history reviewed, required imaging and test results available.  Performed  Maximum sterile technique was used including antiseptics, cap, gloves, gown, hand hygiene, mask and sheet. Skin prep: Chlorhexidine; local anesthetic administered 20 gauge catheter was inserted into right radial artery using the Seldinger technique.  Evaluation Blood flow good; BP tracing good. Complications: No apparent complications.   Wynelle Beckmann 02/13/2014

## 2014-02-13 NOTE — Progress Notes (Signed)
ANTIBIOTIC CONSULT NOTE - INITIAL  Pharmacy Consult for vancomycin Indication: rule out sepsis  No Known Allergies  Patient Measurements: Height: 6\' 1"  (185.4 cm) Weight: 192 lb 0.3 oz (87.1 kg) IBW/kg (Calculated) : 79.9  Vital Signs: Temp: 97.8 F (36.6 C) (04/27 2314) Temp src: Oral (04/27 2314) BP: 113/66 mmHg (04/27 2345) Pulse Rate: 66 (04/28 0045)  Labs:  Recent Labs  02/12/14 1304  WBC 28.2*  HGB 14.0  PLT 161  CREATININE 1.80*   Estimated Creatinine Clearance: 46.9 ml/min (by C-G formula based on Cr of 1.8).   Microbiology: No results found for this or any previous visit (from the past 720 hour(s)).  Medical History: Past Medical History  Diagnosis Date  . Hypertension   . Thyroid disease     Medications:  Prescriptions prior to admission  Medication Sig Dispense Refill  . amLODipine (NORVASC) 5 MG tablet Take 5 mg by mouth daily.      Marland Kitchen levothyroxine (SYNTHROID, LEVOTHROID) 100 MCG tablet Take 100 mcg by mouth daily before breakfast.      . Vitamin D, Ergocalciferol, (DRISDOL) 50000 UNITS CAPS capsule Take 50,000 Units by mouth every 7 (seven) days. Wednesday       Scheduled:  . famotidine (PEPCID) IV  20 mg Intravenous Q12H  . heparin  5,000 Units Subcutaneous 3 times per day  . methylPREDNISolone (SOLU-MEDROL) injection  60 mg Intravenous 4 times per day  . piperacillin-tazobactam (ZOSYN)  IV  3.375 g Intravenous 3 times per day  . potassium chloride  40 mEq Oral Once  . sodium chloride  2,000 mL Intravenous Once   Infusions:  . norepinephrine (LEVOPHED) Adult infusion Stopped (02/12/14 2345)    Assessment: 63yo male started on IV ABX to broaden coverage for septic shock, remains hypotensive.  Goal of Therapy:  Vancomycin trough level 15-20 mcg/ml  Plan:  Will begin vancomycin 750mg  IV Q12H and monitor CBC, Cx, levels prn.  Wynona Neat, PharmD, BCPS  02/13/2014,1:24 AM

## 2014-02-13 NOTE — Progress Notes (Signed)
VASCULAR LAB PRELIMINARY  PRELIMINARY  PRELIMINARY  PRELIMINARY  Bilateral lower extremity venous duplex  completed.    Preliminary report:  Bilateral:  No evidence of DVT, superficial thrombosis, or Baker's Cyst.    Nani Ravens, RVT 02/13/2014, 2:28 PM

## 2014-02-13 NOTE — Progress Notes (Signed)
  Echocardiogram 2D Echocardiogram has been performed.  Jeffrey Guerrero 02/13/2014, 2:32 PM

## 2014-02-13 NOTE — Progress Notes (Signed)
Pt admitted to the unit from 93M at 1800. Pt mental status is a&ox4. Pt oriented to room, staff, and call bell. Skin is intact.. Full assessment to be charted in [facility]. Call bell within reach. Visitor guidelines reviewed w/ pt and/or family.

## 2014-02-13 NOTE — ED Provider Notes (Signed)
I saw and evaluated the patient, reviewed the resident's note and I agree with the findings and plan. If applicable, I agree with the resident's interpretation of the EKG.  If applicable, I was present for critical portions of any procedures performed.  Syncopal episode without prodrome. No CP or SOB.  Neuro intact. Denies complaint.  Became hypotensive and febrile in the ED, minimal response to fluids.  Code sepsis called and levophed started. Cultures and antibiotics given. CXR and UA negative. No AAA. No R heart strain or pericardial effusion.  CRITICAL CARE Performed by: Ezequiel Essex Total critical care time: 30 Critical care time was exclusive of separately billable procedures and treating other patients. Critical care was necessary to treat or prevent imminent or life-threatening deterioration. Critical care was time spent personally by me on the following activities: development of treatment plan with patient and/or surrogate as well as nursing, discussions with consultants, evaluation of patient's response to treatment, examination of patient, obtaining history from patient or surrogate, ordering and performing treatments and interventions, ordering and review of laboratory studies, ordering and review of radiographic studies, pulse oximetry and re-evaluation of patient's condition.  BP 113/66  Pulse 66  Temp(Src) 97.8 F (36.6 C) (Oral)  Resp 16  Ht 6\' 1"  (1.854 m)  Wt 192 lb 0.3 oz (87.1 kg)  BMI 25.34 kg/m2  SpO2 97%   Ezequiel Essex, MD 02/13/14 [phone]

## 2014-02-13 NOTE — Care Management Note (Addendum)
    Page 1 of 2   02/19/2014     3:06:33 PM CARE MANAGEMENT NOTE 02/19/2014  Patient:  Jeffrey Guerrero, Jeffrey Guerrero   Account Number:  [id number]  Date Initiated:  02/13/2014  Documentation initiated by:  Elissa Hefty  Subjective/Objective Assessment:   adm w sepsis, endocarditis     Action/Plan:   lives w wife  picc placed- will need hh iv abx.   Anticipated DC Date:  02/19/2014   Anticipated DC Plan:  [place]  CM consult      [facility] Choice  HOME HEALTH   Choice offered to / List presented to:  C-1 Patient        [place] arranged  HH-1 RN      [place].   Status of service:  Completed, signed off Medicare Important Message given?  NO (If response is "NO", the following Medicare IM given date fields will be blank) Date Medicare IM given:   Date Additional Medicare IM given:    Discharge Disposition:  [place]  Per UR Regulation:  Reviewed for med. necessity/level of care/duration of stay  If discussed at [place] of Stay Meetings, dates discussed:    Comments:  02/19/14 [place], BSN [phone] patient for dc today, [place] notified, they also need script for abx, benefit chk done on eliquiis and xarelto, co pay for eliquis is $50 and xarelto is $35, patient states would like xarelto, informed MD.  [facility] [place]. NCM gave patient care path care which is 12 months free.  [facility] [address] [facility] and [place] does not.  02/16/14 [place], BSN [phone] patient had TEE today which shows pulmonary valve vegetation , patient will need home iv abx. patient chose [facility] , referral made to [facility] for [facility], soc will begin 24-48 hrs post discharge.

## 2014-02-13 NOTE — Clinical Documentation Improvement (Signed)
Possible Clinical Conditions?   Aspiration Pneumonia (POA?) Gram Negative Pneumonia (POA?) Bacterial pneumonia, specify type if known (POA?) Klebsiella PNA Staph/MRSA PNA Strep PNA Pneumococcal PNA Pneumonia (CAP, HAP) (POA?) Other Condition Cannot Clinically Determine   Supporting Information: Risk Factors:(As per notes)"septic shock" " aspiration PNA?" Signs & Symptoms (As per notes by PharmD) "possible aspiration pneumonia, pt on Zosyn"  Treatment:Zosyn 3.375g IV q8h (infuse over 4 hours)   Thank You, Alessandra Grout, RN, BSN, CCDS, Clinical Documentation Specialist:  [phone]   [phone]=[employer]- Health Information Management

## 2014-02-13 NOTE — Progress Notes (Signed)
PULMONARY / CRITICAL CARE MEDICINE   Name: Jeffrey Guerrero MRN: [id number] DOB: 12-31-1950    ADMISSION DATE:  02/12/2014  REFERRING MD : ED  PRIMARY SERVICE: PCCM   CHIEF COMPLAINT: Syncope, concern for sepsis   BRIEF PATIENT DESCRIPTION: 63 y.o. M, presented to ED after syncopal episode at home. Initially stable; however, became hypotensive and febrile while in ED. Code sepsis called, PCCM asked to admit.   SIGNIFICANT EVENTS / STUDIES:  4/27 - admit.  4/27- Head CT >>> neg 4/27- Abd CT >>> neg   LINES / TUBES:  PIV 4/27>>>  CVL 4/27>>>  A line 4/27>>>   CULTURES:  Blood 4/27 >>>  Urine 4/27 >>>  Respiratory visus panel>>>  sputum culture 4/27>>>  Urine legionella  Urine strep   ANTIBIOTICS:  Vanc 4/27 >>>  Zosyn 4/27 >>>  Rocephin 4/27>>> one dose  Azithromycin 4/27>>> one dose  INTERVAL HISTORY:  Off of Levophed since midnight last night (only required pressors for about 15 min).  BP stable s/p 6L of IVF since admission.  Patient denies pain/discomfort or symptoms of infection.  VITAL SIGNS: Temp:  [97.6 F (36.4 C)-102.9 F (39.4 C)] 97.6 F (36.4 C) (04/28 0341) Pulse Rate:  [60-110] 69 (04/28 0600) Resp:  [15-30] 15 (04/28 0600) BP: (71-132)/(45-103) 92/59 mmHg (04/28 0600) SpO2:  [93 %-98 %] 98 % (04/28 0600) Arterial Line BP: (85-118)/(43-63) 113/56 mmHg (04/28 0600) Weight:  [86.807 kg (191 lb 6 oz)-87.1 kg (192 lb 0.3 oz)] 87.1 kg (192 lb 0.3 oz) (04/28 0500)  HEMODYNAMICS:   VENTILATOR SETTINGS:   INTAKE / OUTPUT: Intake/Output     04/27 0701 - 04/28 0700   I.V. (mL/kg) 3890.3 (44.7)   IV Piggyback 1400   Total Intake(mL/kg) 5290.3 (60.7)   Urine (mL/kg/hr) 550   Total Output 550   Net +4740.3         PHYSICAL EXAMINATION: General:  In bed in NAD Neuro:  AAO x 3, responding appropriately HEENT:  WNL Cardiovascular:  RRR Lungs:  CTA B/L Abdomen:  +BS, soft, NT, ND Musculoskeletal:  Able to move all four extremities voluntarily Skin:   Intact   LABS:  CBC  Recent Labs Lab 02/12/14 1304 02/13/14 0500  WBC 28.2* 21.9*  HGB 14.0 11.6*  HCT 39.3 33.4*  PLT 161 130*   Coag's  Recent Labs Lab 02/12/14 2212  APTT 35  INR 1.31   BMET  Recent Labs Lab 02/12/14 1304 02/13/14 0500  NA 139 138  K 3.3* 3.5*  CL 97 105  CO2 25 21  BUN 35* 32*  CREATININE 1.80* 1.39*  GLUCOSE 155* 157*   Electrolytes  Recent Labs Lab 02/12/14 1304 02/12/14 2212 02/13/14 0500  CALCIUM 9.2  --  7.3*  MG  --  2.2 2.3  PHOS  --  2.3 2.2*   Sepsis Markers  Recent Labs Lab 02/12/14 1807 02/12/14 2212 02/13/14 0130  LATICACIDVEN 2.30*  --  1.1  PROCALCITON  --  >175.00  --    Liver Enzymes  Recent Labs Lab 02/12/14 2212  AST 35  ALT 24  ALKPHOS 66  BILITOT 0.5  ALBUMIN 2.4*   Cardiac Enzymes  Recent Labs Lab 02/12/14 2212 02/13/14 0412  TROPONINI <0.30 <0.30  PROBNP 1062.0*  --    Glucose  Recent Labs Lab 02/12/14 2313  GLUCAP 162*    Imaging Ct Abdomen Pelvis Wo Contrast  02/12/2014   CLINICAL DATA:  Evaluate abdominal aortic aneurysm  EXAM: CT ABDOMEN AND PELVIS WITHOUT  CONTRAST  TECHNIQUE: Multidetector CT imaging of the abdomen and pelvis was performed following the standard protocol without intravenous contrast.  COMPARISON:  None.  FINDINGS: The caliber of the abdominal aorta is normal and exhibits a normal tapered contour. There is mural calcification in the infrarenal aorta and common iliac arteries. The maximal diameter of the right common iliac artery is 1.5 cm. The maximal diameter of the left common iliac artery is 1.3 cm. The periaortic and pericaval soft tissues are normal in appearance.  The liver, gallbladder, pancreas, spleen, and adrenal glands are normal in appearance. The kidneys exhibit no evidence of obstruction or of calcified stones. In the lateral aspect of the upper pole of the left kidney there is a hypodensity measuring approximately 1.8 x 2.1 cm which exhibits Hounsfield  measurement of -4 and likely reflects a cyst. The perinephric fat is normal in density. The non-opacified loops of small and large bowel exhibit no evidence of ileus or obstruction or acute inflammation. A normal calibered, uninflamed-appearing appendix is demonstrated on images 64-70 of series 2. Within the pelvis the urinary bladder, prostate gland, and seminal vesicles exhibit no acute abnormalities. There is no inguinal nor significant umbilical hernia. The psoas musculature is normal in appearance. The lumbar vertebral bodies are preserved in height. There are bilateral pars defects at L5 without evidence of spondylolisthesis. The bony pelvis exhibits no acute abnormalities. The lung bases are clear.  IMPRESSION: 1. There is no evidence of an abdominal aortic aneurysm. No iliac artery aneurysm or splenic artery aneurysm is demonstrated. The periaortic and pericaval regions appear normal. 2. There is no acute hepatobiliary abnormality nor acute urinary tract abnormality. A hypodensity in the upper pole of the left kidney likely reflects a cyst. 3. There is no acute bowel abnormality. 4. There are bilateral pars defects at L5 without evidence of spondylolisthesis.   Electronically Signed   By: David  Martinique   On: 02/12/2014 19:03   Dg Chest 2 View  02/12/2014   CLINICAL DATA:  STATUS POST FALL EARLIER TODAY, LOSS CONSCIOUSNESS  EXAM: CHEST  2 VIEW  COMPARISON:  None.  FINDINGS: The heart size and mediastinal contours are within normal limits. Both lungs are clear. The visualized skeletal structures are unremarkable.  IMPRESSION: No active cardiopulmonary disease.   Electronically Signed   By: Kathreen Devoid   On: 02/12/2014 18:51   Ct Head Wo Contrast  02/12/2014   CLINICAL DATA:  Loss of consciousness.  EXAM: CT HEAD WITHOUT CONTRAST  TECHNIQUE: Contiguous axial images were obtained from the base of the skull through the vertex without contrast.  COMPARISON:  None  FINDINGS: No evidence for acute hemorrhage,  mass lesion, midline shift, hydrocephalus or large infarct. The visualized sinuses are clear. No acute bone abnormality.  IMPRESSION: No acute intracranial abnormality.   Electronically Signed   By: Markus Daft M.D.   On: 02/12/2014 16:05   Dg Chest Port 1 View  02/13/2014   CLINICAL DATA:  Sepsis.  Cough.  EXAM: PORTABLE CHEST - 1 VIEW  COMPARISON:  02/12/2014  FINDINGS: Heart size is normal. Pulmonary vascularity is at the upper limits of normal. Lungs are clear. No significant osseous abnormality.  IMPRESSION: No acute abnormalities.   Electronically Signed   By: Rozetta Nunnery M.D.   On: 02/13/2014 07:28    04/28 CXR: no acute abnormalities  ASSESSMENT / PLAN: PULMONARY  A:  - Mild cough, no sputum production--> aspiration? CXR clear - CXR remains negative  P:  -monitor  respiratory status closely    CARDIOVASCULAR  A:  - HTN, on amlodipine at home--> became hypotensive in ED - EKG: S1Q3T3 pattern, D-dimer 3.42--> ? PE; no CP, normal O2 saturation --> less likely to have PE  - 2D echo done in ED-->no right heart straining or pericardial effusion  P:  - Hold home amlodipine  - Aggressive fluid resuscitation: 6L NS followed by NS at 150cc/hr - CE x 3 - LE doppler to r/o DVT - Levophed gtt prn (off since shortly after arriving in ICU last night) - Cortisol level pending - low suspicion for PE here, but consider CT-PA or V/q scan if remaining eval is unremarkable.   RENAL  A:  - Mild AKI improving s/p IVF, likely pre-renal vs ATN secondary to shock; no hydronephrosis on CT - Hypokalemia - UA negative for UTI, but positive RBC--> myoglobinuria? CK mildly elevated P:  - replete K  - check FeNa    GASTROINTESTINAL  A:  - No hx of GI issues  P:  - GI ppx - Pepcid - check lipase for completeness.    HEMATOLOGIC  A:  - Leukocytosis with WBC 28.2 in the setting of septic shock; improving   Anemia, Hgb 14 --> 11.6; appears dilutional as all heme lines are down and patient is  s/p 6L P:  - Monitor CBC     INFECTIOUS  A: Septic shock without definite source; PCT 175, lactic acid 2.30 --> 1.1 P:  - IV Zosyn and Vancomycin  - Continue solumedrol q6h pending cortisol level  - Pan-culture   - Respiratory virus panel, HIV - Trend lactic acid and WBC   ENDOCRINE  A:  - Hypothyroidism, TSH 7.810 on admission, in the acute illness, TSH is difficult to interpret. P:  - Continue home Synthroid  - Monitor CBG, goal 140 to 180  - Cortisol level pending   NEUROLOGIC  A:  - Syncope-->unclear etiology, likely due to vasovagal or Orthostatic status; CT head neg. - No sign of meningitis or infectious encephalitis   P:  - Monitor mental stautus closely - Telemetry monitoring for arrhythmia - Neuro check q4h    Duwaine Maxin DO IMTS, PGY1  30 minutes CC time Etiology of his apparent sepsis is unclear. No other clinical indicators to suggest PE, but consider.  Will transfer to telemetry and to [facility] as of 4/29.   Baltazar Apo, MD, PhD 02/13/2014, 9:25 AM [facility] [phone] or if no answer [phone]

## 2014-02-14 DIAGNOSIS — D72819 Decreased white blood cell count, unspecified: Secondary | ICD-10-CM | POA: Diagnosis present

## 2014-02-14 DIAGNOSIS — I10 Essential (primary) hypertension: Secondary | ICD-10-CM

## 2014-02-14 DIAGNOSIS — N179 Acute kidney failure, unspecified: Secondary | ICD-10-CM

## 2014-02-14 DIAGNOSIS — R7881 Bacteremia: Secondary | ICD-10-CM

## 2014-02-14 DIAGNOSIS — E039 Hypothyroidism, unspecified: Secondary | ICD-10-CM

## 2014-02-14 LAB — CBC WITH DIFFERENTIAL/PLATELET
Basophils Absolute: 0 10*3/uL (ref 0.0–0.1)
Basophils Relative: 0 % (ref 0–1)
EOS PCT: 0 % (ref 0–5)
Eosinophils Absolute: 0 10*3/uL (ref 0.0–0.7)
HCT: 33.2 % — ABNORMAL LOW (ref 39.0–52.0)
Hemoglobin: 11.7 g/dL — ABNORMAL LOW (ref 13.0–17.0)
LYMPHS ABS: 0.7 10*3/uL (ref 0.7–4.0)
Lymphocytes Relative: 3 % — ABNORMAL LOW (ref 12–46)
MCH: 30.2 pg (ref 26.0–34.0)
MCHC: 35.2 g/dL (ref 30.0–36.0)
MCV: 85.8 fL (ref 78.0–100.0)
Monocytes Absolute: 1.4 10*3/uL — ABNORMAL HIGH (ref 0.1–1.0)
Monocytes Relative: 6 % (ref 3–12)
Neutro Abs: 20.6 10*3/uL — ABNORMAL HIGH (ref 1.7–7.7)
Neutrophils Relative %: 91 % — ABNORMAL HIGH (ref 43–77)
Platelets: 115 10*3/uL — ABNORMAL LOW (ref 150–400)
RBC: 3.87 MIL/uL — AB (ref 4.22–5.81)
RDW: 14.3 % (ref 11.5–15.5)
WBC: 22.6 10*3/uL — ABNORMAL HIGH (ref 4.0–10.5)

## 2014-02-14 LAB — CK TOTAL AND CKMB (NOT AT ARMC)
CK, MB: 4.2 ng/mL — ABNORMAL HIGH (ref 0.3–4.0)
RELATIVE INDEX: 2.3 (ref 0.0–2.5)
Total CK: 182 U/L (ref 7–232)

## 2014-02-14 LAB — BASIC METABOLIC PANEL
BUN: 28 mg/dL — ABNORMAL HIGH (ref 6–23)
CALCIUM: 7.9 mg/dL — AB (ref 8.4–10.5)
CO2: 22 mEq/L (ref 19–32)
Chloride: 107 mEq/L (ref 96–112)
Creatinine, Ser: 1.14 mg/dL (ref 0.50–1.35)
GFR, EST AFRICAN AMERICAN: 77 mL/min — AB (ref >90)
GFR, EST NON AFRICAN AMERICAN: 66 mL/min — AB (ref >90)
GLUCOSE: 199 mg/dL — AB (ref 70–99)
Potassium: 3.4 mEq/L — ABNORMAL LOW (ref 3.7–5.3)
Sodium: 140 mEq/L (ref 137–147)

## 2014-02-14 LAB — MYOGLOBIN, SERUM: MYOGLOBIN: 606 ng/mL — AB (ref <111)

## 2014-02-14 LAB — URINALYSIS, ROUTINE W REFLEX MICROSCOPIC
Bilirubin Urine: NEGATIVE
Glucose, UA: NEGATIVE mg/dL
Ketones, ur: NEGATIVE mg/dL
Leukocytes, UA: NEGATIVE
NITRITE: NEGATIVE
Protein, ur: NEGATIVE mg/dL
Specific Gravity, Urine: 1.027 (ref 1.005–1.030)
Urobilinogen, UA: 0.2 mg/dL (ref 0.0–1.0)
pH: 6 (ref 5.0–8.0)

## 2014-02-14 LAB — URINE MICROSCOPIC-ADD ON

## 2014-02-14 LAB — RESPIRATORY VIRUS PANEL
Adenovirus: NOT DETECTED
INFLUENZA A H1: NOT DETECTED
INFLUENZA A H3: NOT DETECTED
Influenza A: NOT DETECTED
Influenza B: NOT DETECTED
METAPNEUMOVIRUS: NOT DETECTED
PARAINFLUENZA 1 A: NOT DETECTED
Parainfluenza 2: NOT DETECTED
Parainfluenza 3: NOT DETECTED
RESPIRATORY SYNCYTIAL VIRUS A: NOT DETECTED
RESPIRATORY SYNCYTIAL VIRUS B: NOT DETECTED
RHINOVIRUS: NOT DETECTED

## 2014-02-14 LAB — LEGIONELLA ANTIGEN, URINE: LEGIONELLA ANTIGEN, URINE: NEGATIVE

## 2014-02-14 LAB — LACTIC ACID, PLASMA: Lactic Acid, Venous: 1.2 mmol/L (ref 0.5–2.2)

## 2014-02-14 LAB — STREP PNEUMONIAE URINARY ANTIGEN: STREP PNEUMO URINARY ANTIGEN: NEGATIVE

## 2014-02-14 LAB — URINE CULTURE
COLONY COUNT: NO GROWTH
Culture: NO GROWTH

## 2014-02-14 LAB — MAGNESIUM: Magnesium: 2.7 mg/dL — ABNORMAL HIGH (ref 1.5–2.5)

## 2014-02-14 LAB — PROCALCITONIN: PROCALCITONIN: 91.52 ng/mL

## 2014-02-14 LAB — PHOSPHORUS: PHOSPHORUS: 2.5 mg/dL (ref 2.3–4.6)

## 2014-02-14 MED ORDER — POTASSIUM CHLORIDE CRYS ER 20 MEQ PO TBCR
40.0000 meq | EXTENDED_RELEASE_TABLET | Freq: Once | ORAL | Status: AC
  Administered 2014-02-14: 40 meq via ORAL
  Filled 2014-02-14: qty 2

## 2014-02-14 NOTE — Progress Notes (Signed)
[facility] PROGRESS NOTE   Jeffrey Guerrero [id number] DOB: 05/28/1951 DOA: 02/12/2014 PCP: [name], MD  HPI 63y/o M PMH HTN, hypothyroidism, and knee arthritis, presents to the ED from his PCP with a single episode of syncopy from a standing position at home 4/27 am. Denies any HA, N/V, aura, CP, SOB etc. Did report feeling tired. Small laceration between eyes due to fall. While being evaluated in the ED he became severely hypotensive, febrile and tachycardic. WBC 28k, meeting sepsis criteria. Intensive work up with labs and imaging unable to identify a source. Admitted for Sepsis and persistent hypotension post IVF and Levophed.  Subjective: Pt laying comfortable in bed, NAD, no complaints  Assessment/Plan:  Principal Problem:   Septic shock Active Problems:   Unspecified hypothyroidism   Essential hypertension, benign   Leukocytopenia, unspecified   Gram-positive bacteremia   ARF (acute renal failure)   HTN (hypertension)     Septic shock -currently unidentified source of infection,sepsis developed during ED evaluation -became severely hypotensive in the ED resulting in 3L+ NS IVF and short duration of Levophed to raise BP -Multiple testing to id source, resp panel pending  -PCT down trending (now 91.52), lactic acid down trending (1.2 now) -Rocephin & Azithromycin x1 in ED, Zosyn x 3 days-d/c 4/29, now continue on Vancomycin-day 2 & IVF    Bacteremia -unidentified source currently, possibly aspiration pneumonia during syncopy -1st set of blood cultures preliminary: G+ cocci pairs -CXR, Head & Abd CT, 2D Cardiac Echo, Renal US all negative for acute processes -Continue on Vancomycin -Consider TEE to further eval for endocarditis post Blood culture results    Syncopy -Single episode, no warning feeling or prodrome, potentially orthostatic, can not rule out this is secondary to sepsis. -Head CT normal, LE Doppler-negative, D-dimer 3.42 (no PE s/s) -small lac  to face -Monitor for arrythmia and mental status changes     Acute Kidney Injury/ATN -Acute issue due to severe hypotensive crisis/sepsis, no reported history, resolving -BUN down trending 35 to 28, sCr initially 1.80, now 1.14 down trending, no hydronephrosis on Abd CT, no acute issue on Renal U/S -UA shows +RBC & Hgb, recheck UA    Hypokalemia -initially 3.3 -Replete w/ KCl PO    Hypothyroidism -Chronic, elevated TSH, may be due to acute infection -Outpatient follow up, reevaluate Synthroid dosing    HTN -Chronic -Controlled with Amlodipine, but held due to current hypotensive condition -restart upon resolution of sepsis   Code Status:FULL  Family Communication: Plan discussed with the patient. Disposition Plan: Remains inpatient   Antibiotics:  Vancomycin  Objective: Filed Vitals:   02/14/14 0615  BP: 102/64  Pulse: 57  Temp: 98.1 F (36.7 C)  Resp: 18    Intake/Output Summary (Last 24 hours) at 02/14/14 1106 Last data filed at 02/14/14 0257  Gross per 24 hour  Intake   1434 ml  Output   1150 ml  Net    284 ml   Filed Weights   02/13/14 0500 02/13/14 1824 02/14/14 0615  Weight: 87.1 kg (192 lb 0.3 oz) 90.493 kg (199 lb 8 oz) 93.94 kg (207 lb 1.6 oz)    Exam: General: Alert and awake, oriented x3, not in any acute distress. HEENT: anicteric sclera, pupils reactive to light and accommodation, EOMI CVS: S1-S2 clear, no murmur rubs or gallops Chest: clear to auscultation bilaterally, no wheezing, rales or rhonchi Abdomen: soft nontender, nondistended, normal bowel sounds, no organomegaly Extremities: no cyanosis, clubbing or edema noted bilaterally Neuro: Cranial nerves II-XII  intact, no focal neurological deficits  Data Reviewed: Basic Metabolic Panel:  Recent Labs Lab 02/12/14 1304 02/12/14 2212 02/13/14 0500 02/14/14 0457  NA 139  --  138 140  K 3.3*  --  3.5* 3.4*  CL 97  --  105 107  CO2 25  --  21 22  GLUCOSE 155*  --  157* 199*  BUN  35*  --  32* 28*  CREATININE 1.80*  --  1.39* 1.14  CALCIUM 9.2  --  7.3* 7.9*  MG  --  2.2 2.3 2.7*  PHOS  --  2.3 2.2* 2.5   Liver Function Tests:  Recent Labs Lab 02/12/14 2212  AST 35  ALT 24  ALKPHOS 66  BILITOT 0.5  PROT 6.3  ALBUMIN 2.4*    Recent Labs Lab 02/12/14 2212  LIPASE 26  AMYLASE 28   CBC:  Recent Labs Lab 02/12/14 1304 02/13/14 0500 02/14/14 0457  WBC 28.2* 21.9* 22.6*  NEUTROABS 23.8*  --  20.6*  HGB 14.0 11.6* 11.7*  HCT 39.3 33.4* 33.2*  MCV 86.9 87.4 85.8  PLT 161 130* 115*   Cardiac Enzymes:  Recent Labs Lab 02/12/14 2212 02/13/14 0412 02/13/14 0844 02/13/14 1245 02/14/14 0457  CKTOTAL 547*  --  493*  --  182  CKMB  --   --  4.4*  --  4.2*  TROPONINI <0.30 <0.30  --  <0.30  --    BNP (last 3 results)  Recent Labs  02/12/14 2212  PROBNP 1062.0*   CBG:  Recent Labs Lab 02/12/14 2313  GLUCAP 162*    Micro Recent Results (from the past 240 hour(s))  URINE CULTURE     Status: None   Collection Time    02/12/14  7:21 PM      Result Value Ref Range Status   Specimen Description URINE, CLEAN CATCH   Final   Special Requests NONE   Final   Culture  Setup Time     Final   Value: 02/12/2014 19:58     Performed at [facility]     Final   Value: NO GROWTH     Performed at [facility]   Culture     Final   Value: NO GROWTH     Performed at [facility]   Report Status 02/14/2014 FINAL   Final  CULTURE, BLOOD (ROUTINE X 2)     Status: None   Collection Time    02/12/14  7:30 PM      Result Value Ref Range Status   Specimen Description BLOOD RIGHT FOREARM   Final   Special Requests BOTTLES DRAWN AEROBIC AND ANAEROBIC 6CCBLUE 5CCRED   Final   Culture  Setup Time     Final   Value: 02/13/2014 01:15     Performed at [facility]   Culture     Final   Value: GRAM POSITIVE COCCI IN PAIRS     Note: Gram Stain Report Called to,Read Back By and Verified With: Mardene Celeste  ON 02/13/2014 AT 8:32P BY WILEJ     Performed at [facility]   Report Status PENDING   Incomplete  CULTURE, BLOOD (ROUTINE X 2)     Status: None   Collection Time    02/12/14  7:45 PM      Result Value Ref Range Status   Specimen Description BLOOD HAND LEFT   Final   Special Requests BOTTLES DRAWN AEROBIC AND  ANAEROBIC 5CC   Final   Culture  Setup Time     Final   Value: 02/13/2014 01:15     Performed at [facility]   Culture     Final   Value: GRAM POSITIVE COCCI IN PAIRS     Note: Gram Stain Report Called to,Read Back By and Verified With: Mardene Celeste ON 02/13/2014 AT 8:32P BY WILEJ     Performed at [facility]   Report Status PENDING   Incomplete  MRSA PCR SCREENING     Status: None   Collection Time    02/12/14 11:12 PM      Result Value Ref Range Status   MRSA by PCR NEGATIVE  NEGATIVE Final   Comment:            The GeneXpert MRSA Assay (FDA     approved for NASAL specimens     only), is one component of a     comprehensive MRSA colonization     surveillance program. It is not     intended to diagnose MRSA     infection nor to guide or     monitor treatment for     MRSA infections.  CULTURE, BLOOD (ROUTINE X 2)     Status: None   Collection Time    02/13/14  8:44 AM      Result Value Ref Range Status   Specimen Description BLOOD LEFT ANTECUBITAL   Final   Special Requests     Final   Value: BOTTLES DRAWN AEROBIC AND ANAEROBIC 10CC BLUE, [place] RED   Culture  Setup Time     Final   Value: 02/13/2014 14:22     Performed at [facility]   Culture     Final   Value:        BLOOD CULTURE RECEIVED NO GROWTH TO DATE CULTURE WILL BE HELD FOR 5 DAYS BEFORE ISSUING A FINAL NEGATIVE REPORT     Performed at [facility]   Report Status PENDING   Incomplete  CULTURE, BLOOD (ROUTINE X 2)     Status: None   Collection Time    02/13/14  8:52 AM      Result Value Ref Range Status   Specimen Description BLOOD LEFT HAND   Final   Special  Requests BOTTLES DRAWN AEROBIC AND ANAEROBIC 10CC   Final   Culture  Setup Time     Final   Value: 02/13/2014 14:22     Performed at [facility]   Culture     Final   Value:        BLOOD CULTURE RECEIVED NO GROWTH TO DATE CULTURE WILL BE HELD FOR 5 DAYS BEFORE ISSUING A FINAL NEGATIVE REPORT     Performed at [facility]   Report Status PENDING   Incomplete     Studies: Ct Abdomen Pelvis Wo Contrast  02/12/2014   CLINICAL DATA:  Evaluate abdominal aortic aneurysm  EXAM: CT ABDOMEN AND PELVIS WITHOUT CONTRAST  TECHNIQUE: Multidetector CT imaging of the abdomen and pelvis was performed following the standard protocol without intravenous contrast.  COMPARISON:  None.  FINDINGS: The caliber of the abdominal aorta is normal and exhibits a normal tapered contour. There is mural calcification in the infrarenal aorta and common iliac arteries. The maximal diameter of the right common iliac artery is 1.5 cm. The maximal diameter of the left common iliac artery is 1.3 cm. The periaortic and pericaval soft tissues are  normal in appearance.  The liver, gallbladder, pancreas, spleen, and adrenal glands are normal in appearance. The kidneys exhibit no evidence of obstruction or of calcified stones. In the lateral aspect of the upper pole of the left kidney there is a hypodensity measuring approximately 1.8 x 2.1 cm which exhibits Hounsfield measurement of -4 and likely reflects a cyst. The perinephric fat is normal in density. The non-opacified loops of small and large bowel exhibit no evidence of ileus or obstruction or acute inflammation. A normal calibered, uninflamed-appearing appendix is demonstrated on images 64-70 of series 2. Within the pelvis the urinary bladder, prostate gland, and seminal vesicles exhibit no acute abnormalities. There is no inguinal nor significant umbilical hernia. The psoas musculature is normal in appearance. The lumbar vertebral bodies are preserved in height. There are  bilateral pars defects at L5 without evidence of spondylolisthesis. The bony pelvis exhibits no acute abnormalities. The lung bases are clear.  IMPRESSION: 1. There is no evidence of an abdominal aortic aneurysm. No iliac artery aneurysm or splenic artery aneurysm is demonstrated. The periaortic and pericaval regions appear normal. 2. There is no acute hepatobiliary abnormality nor acute urinary tract abnormality. A hypodensity in the upper pole of the left kidney likely reflects a cyst. 3. There is no acute bowel abnormality. 4. There are bilateral pars defects at L5 without evidence of spondylolisthesis.   Electronically Signed   By: David  Martinique   On: 02/12/2014 19:03   Dg Chest 2 View  02/12/2014   CLINICAL DATA:  STATUS POST FALL EARLIER TODAY, LOSS CONSCIOUSNESS  EXAM: CHEST  2 VIEW  COMPARISON:  None.  FINDINGS: The heart size and mediastinal contours are within normal limits. Both lungs are clear. The visualized skeletal structures are unremarkable.  IMPRESSION: No active cardiopulmonary disease.   Electronically Signed   By: Kathreen Devoid   On: 02/12/2014 18:51   Ct Head Wo Contrast  02/12/2014   CLINICAL DATA:  Loss of consciousness.  EXAM: CT HEAD WITHOUT CONTRAST  TECHNIQUE: Contiguous axial images were obtained from the base of the skull through the vertex without contrast.  COMPARISON:  None  FINDINGS: No evidence for acute hemorrhage, mass lesion, midline shift, hydrocephalus or large infarct. The visualized sinuses are clear. No acute bone abnormality.  IMPRESSION: No acute intracranial abnormality.   Electronically Signed   By: Markus Daft M.D.   On: 02/12/2014 16:05   US Renal  02/13/2014   CLINICAL DATA:  Acute renal failure.  EXAM: RENAL/URINARY TRACT ULTRASOUND COMPLETE  COMPARISON:  CT scan dated 02/12/2014  FINDINGS: Right Kidney:  Length: 10.8 cm. Two tiny cysts on the lower pole, 8 and 9 mm. Otherwise normal.  Left Kidney:  Length: 11.8 cm. 2.3 cm simple appearing cyst in the mid  kidney. No hydronephrosis.  Bladder:  Appears normal for degree of bladder distention.  IMPRESSION: Small cysts in each kidney.  Otherwise, normal exam.   Electronically Signed   By: Rozetta Nunnery M.D.   On: 02/13/2014 09:57   Dg Chest Port 1 View  02/13/2014   CLINICAL DATA:  Sepsis.  Cough.  EXAM: PORTABLE CHEST - 1 VIEW  COMPARISON:  02/12/2014  FINDINGS: Heart size is normal. Pulmonary vascularity is at the upper limits of normal. Lungs are clear. No significant osseous abnormality.  IMPRESSION: No acute abnormalities.   Electronically Signed   By: Rozetta Nunnery M.D.   On: 02/13/2014 07:28   2D Echocardiogram without contrast  02/13/14 Study Conclusions- Left ventricle: The  cavity size was mildly dilated. Wall thickness was normal. Systolic function was mildly reduced. The estimated ejection fraction was in the range of 45% to 50%. Diffuse hypokinesis. - Right ventricle: The cavity size was moderately dilated. - Right atrium: The atrium was moderately dilated. - Atrial septum: No defect or patent foramen ovale was identified. Aortic valve: Mildly thickened leaflets. Doppler: There was no stenosis. Mitral valve: Mildly thickened leaflets . Doppler: No significant regurgitation. Pulmonic valve: Structurally normal valve. Cusp separation was normal. Doppler: Transvalvular velocity was within the normal range. Trivial regurgitation. Tricuspid valve: Structurally normal valve. Leaflet separation was normal. Doppler: Transvalvular velocity was within the normal range. Mild regurgitation. Transthoracic echocardiography. M-mode, complete 2D, spectral Doppler, and color Doppler.   Prepared and Electronically Authenticated by Jenkins Rouge [id number]:[id number]    Scheduled Meds: . heparin  5,000 Units Subcutaneous 3 times per day  . levothyroxine  100 mcg Oral QAC breakfast  . vancomycin  750 mg Intravenous Q12H  . [START ON 02/18/2014] Vitamin D (Ergocalciferol)  50,000 Units Oral Q7 days   Continuous  Infusions: . sodium chloride 1,000 mL (02/14/14 0148)       Time spent: 35 minutes    Myrla Halsted PA-S   [facility] Pager [phone] If 7PM-7AM, please contact night-coverage at [email or web address], password [facility] 02/14/2014, 11:06 AM  LOS: 2 days     Addendum  Patient seen and examined, chart and data base reviewed.  I agree with the above assessment and plan.  For full details please see Mrs. Myrla Halsted PA-S note.   Birdie Hopes, MD [facility] Pager: [phone] 02/14/2014, 12:44 PM

## 2014-02-15 DIAGNOSIS — B953 Streptococcus pneumoniae as the cause of diseases classified elsewhere: Secondary | ICD-10-CM

## 2014-02-15 LAB — COMPREHENSIVE METABOLIC PANEL
ALT: 25 U/L (ref 0–53)
AST: 20 U/L (ref 0–37)
Albumin: 2.1 g/dL — ABNORMAL LOW (ref 3.5–5.2)
Alkaline Phosphatase: 53 U/L (ref 39–117)
BILIRUBIN TOTAL: 0.3 mg/dL (ref 0.3–1.2)
BUN: 26 mg/dL — AB (ref 6–23)
CO2: 24 mEq/L (ref 19–32)
CREATININE: 1.03 mg/dL (ref 0.50–1.35)
Calcium: 7.7 mg/dL — ABNORMAL LOW (ref 8.4–10.5)
Chloride: 108 mEq/L (ref 96–112)
GFR calc Af Amer: 87 mL/min — ABNORMAL LOW (ref >90)
GFR calc non Af Amer: 75 mL/min — ABNORMAL LOW (ref >90)
Glucose, Bld: 106 mg/dL — ABNORMAL HIGH (ref 70–99)
Potassium: 3.4 mEq/L — ABNORMAL LOW (ref 3.7–5.3)
Sodium: 142 mEq/L (ref 137–147)
TOTAL PROTEIN: 5.5 g/dL — AB (ref 6.0–8.3)

## 2014-02-15 LAB — CULTURE, BLOOD (ROUTINE X 2)

## 2014-02-15 LAB — CBC WITH DIFFERENTIAL/PLATELET
Band Neutrophils: 0 % (ref 0–10)
Basophils Absolute: 0 10*3/uL (ref 0.0–0.1)
Basophils Relative: 0 % (ref 0–1)
Blasts: 0 %
EOS ABS: 0 10*3/uL (ref 0.0–0.7)
EOS PCT: 0 % (ref 0–5)
HCT: 33.3 % — ABNORMAL LOW (ref 39.0–52.0)
Hemoglobin: 11.4 g/dL — ABNORMAL LOW (ref 13.0–17.0)
LYMPHS ABS: 1.6 10*3/uL (ref 0.7–4.0)
Lymphocytes Relative: 25 % (ref 12–46)
MCH: 30.1 pg (ref 26.0–34.0)
MCHC: 34.2 g/dL (ref 30.0–36.0)
MCV: 87.9 fL (ref 78.0–100.0)
MONO ABS: 0.7 10*3/uL (ref 0.1–1.0)
MONOS PCT: 12 % (ref 3–12)
Metamyelocytes Relative: 0 %
Myelocytes: 0 %
NEUTROS ABS: 3.9 10*3/uL (ref 1.7–7.7)
NEUTROS PCT: 63 % (ref 43–77)
NRBC: 0 /100{WBCs}
Platelets: 105 10*3/uL — ABNORMAL LOW (ref 150–400)
Promyelocytes Absolute: 0 %
RBC: 3.79 MIL/uL — AB (ref 4.22–5.81)
RDW: 14.7 % (ref 11.5–15.5)
WBC: 6.2 10*3/uL (ref 4.0–10.5)

## 2014-02-15 MED ORDER — DEXTROSE 5 % IV SOLN
2.0000 g | Freq: Two times a day (BID) | INTRAVENOUS | Status: DC
  Administered 2014-02-15 – 2014-02-19 (×8): 2 g via INTRAVENOUS
  Filled 2014-02-15 (×9): qty 2

## 2014-02-15 MED ORDER — POTASSIUM CHLORIDE CRYS ER 20 MEQ PO TBCR
40.0000 meq | EXTENDED_RELEASE_TABLET | Freq: Once | ORAL | Status: AC
  Administered 2014-02-15: 40 meq via ORAL
  Filled 2014-02-15: qty 2

## 2014-02-15 MED ORDER — SODIUM CHLORIDE 0.9 % IV SOLN: INTRAVENOUS | Status: DC

## 2014-02-15 MED ORDER — SODIUM CHLORIDE 0.9 % IJ SOLN
10.0000 mL | INTRAMUSCULAR | Status: DC | PRN
  Administered 2014-02-17: 10 mL
  Administered 2014-02-18: 40 mL
  Administered 2014-02-19: 10 mL

## 2014-02-15 MED ORDER — DEXTROSE 5 % IV SOLN
2.0000 g | INTRAVENOUS | Status: DC
  Administered 2014-02-15: 2 g via INTRAVENOUS
  Filled 2014-02-15: qty 2

## 2014-02-15 MED ORDER — SODIUM CHLORIDE 0.9 % IV SOLN
250.0000 mL | INTRAVENOUS | Status: DC
  Administered 2014-02-16: via INTRAVENOUS
  Administered 2014-02-16: 75 mL via INTRAVENOUS
  Administered 2014-02-17 – 2014-02-18 (×2): via INTRAVENOUS

## 2014-02-15 NOTE — Progress Notes (Signed)
TEE requested by ID. No contraindications. I discussed risks an benefits with pt and he is agreeable to proceed.  Scheduled for 2 pm with Dr [name].   Kerin Ransom PA-C 02/15/2014 2:23 PM

## 2014-02-15 NOTE — Progress Notes (Signed)
Peripherally Inserted Central Catheter/Midline Placement  The IV Nurse has discussed with the patient and/or persons authorized to consent for the patient, the purpose of this procedure and the potential benefits and risks involved with this procedure.  The benefits include less needle sticks, lab draws from the catheter and patient may be discharged home with the catheter.  Risks include, but not limited to, infection, bleeding, blood clot (thrombus formation), and puncture of an artery; nerve damage and irregular heat beat.  Alternatives to this procedure were also discussed.  PICC/Midline Placement Documentation        Jeffrey Guerrero 02/15/2014, 2:12 PM

## 2014-02-15 NOTE — Progress Notes (Addendum)
[facility] PROGRESS NOTE   Jeffrey Guerrero PXT:[id number] DOB: 1951-03-02 DOA: 02/12/2014 PCP: [name], MD  HPI 63y/o M PMH HTN, hypothyroidism, and knee arthritis, presents to the ED from his PCP with a single episode of syncope from a standing position at home 4/27 am. Denies any HA, N/V, aura, CP, SOB etc. Did report feeling tired. Small laceration between eyes due to fall. While being evaluated in the ED he became severely hypotensive, febrile and tachycardic. WBC 28k, meeting sepsis criteria. Intensive work up with labs and imaging unable to identify a source. Admitted for Sepsis and persistent hypotension post IVF and Levophed.  Subjective: Pt laying comfortable in bed, NAD, no complaints.  He understands he is having a TEE tomorrow at 2:00 pm and is most likely going home on IV antibiotics for his bloodstream infection.  Assessment/Plan:  Principal Problem:   Septic shock Active Problems:   Unspecified hypothyroidism   Essential hypertension, benign   Leukocytopenia, unspecified   Gram-positive bacteremia   ARF (acute renal failure)   HTN (hypertension)    Septic shock -currently unidentified source of infection,sepsis developed during ED evaluation -became severely hypotensive in the ED resulting in 3L+ NS IVF and short duration of Levophed to raise BP -Multiple testing to id source, resp panel pending & Legionella negative -Resolved: PCT and lactic acid down trending, WBC 6.2  -Rocephin & Azithromycin x1 in ED, Zosyn x 3 days-d/c 4/29, now continue on Vancomycin x3 days-d/c 4/30,  -Restart Rocephin & IVF (4/30)    Bacteremia -unidentified source currently, possibly aspiration pneumonia during syncope -4/27 2 of 2 blood cultures: G+ cocci pairs=Strep pneumoniae; 4/28- blood cultures pending, no growth yet -CXR, Head & Abd CT, 2D Cardiac Echo, Renal US all negative for acute processes -Restart Rocephin, place a PICC line for long term abx post d/c -Consult  ID: agree with TEE to further eval for endocarditis post Blood culture results -TEE scheduled for 5/1 at 2:00 pm.    Syncope -Single episode, no warning feeling or prodrome, potentially orthostatic, can not rule out this is secondary to sepsis. -Head CT normal, LE Doppler-negative, D-dimer 3.42 (no PE s/s) -small healing lac to face -No noted arrythmia or mental status changes, d/c telemetry     Acute Kidney Injury/ATN -Resolving, acute issue due to severe hypotensive crisis/sepsis, no reported history -BUN down trending 35 to 26, sCr initially 1.80, now 1.03 down trending,  -no hydronephrosis on Abd CT, no acute issue on Renal U/S -UA shows +RBC & Hgb, recheck UA, monitor w/ BMP    Hypokalemia -continues to be low, 3.4 -Replete w/ KCl PO    Hypothyroidism -Chronic, elevated TSH, may be due to acute infection -Outpatient follow up, reevaluate Synthroid dosing    HTN -Chronic -Controlled with Amlodipine, but held due to current hypotensive condition -restart upon resolution of sepsis   Code Status:FULL  Family Communication: Plan discussed with the patient. Daughter at bedside. Disposition Plan: Remains inpatient.  D/C to home when appropriate.  Will need HHRN for IV antibiotics.   Antibiotics:  Vancomycin  Objective: Filed Vitals:   02/15/14 0945  BP: 115/70  Pulse: 72  Temp: 97.4 F (36.3 C)  Resp: 20    Intake/Output Summary (Last 24 hours) at 02/15/14 1323 Last data filed at 02/15/14 0015  Gross per 24 hour  Intake      0 ml  Output   1375 ml  Net  -1375 ml   Filed Weights   02/14/14 0615 02/15/14 0500 02/15/14  0900  Weight: 93.94 kg (207 lb 1.6 oz) 96.752 kg (213 lb 4.8 oz) 95.391 kg (210 lb 4.8 oz)    Exam: General: Alert and awake, oriented x3, not in any acute distress. HEENT: anicteric sclera, pupils reactive to light and accommodation, EOMI CVS: S1-S2 clear, no murmur rubs or gallops Chest: clear to auscultation bilaterally, no wheezing, rales  or rhonchi Abdomen: soft nontender, nondistended, normal bowel sounds, no organomegaly Extremities: no cyanosis, clubbing or edema noted bilaterally Neuro: Cranial nerves II-XII intact, no focal neurological deficits  Data Reviewed: Basic Metabolic Panel:  Recent Labs Lab 02/12/14 1304 02/12/14 2212 02/13/14 0500 02/14/14 0457 02/15/14 0648  NA 139  --  138 140 142  K 3.3*  --  3.5* 3.4* 3.4*  CL 97  --  105 107 108  CO2 25  --  $R'21 22 24  'hi$ GLUCOSE 155*  --  157* 199* 106*  BUN 35*  --  32* 28* 26*  CREATININE 1.80*  --  1.39* 1.14 1.03  CALCIUM 9.2  --  7.3* 7.9* 7.7*  MG  --  2.2 2.3 2.7*  --   PHOS  --  2.3 2.2* 2.5  --    Liver Function Tests:  Recent Labs Lab 02/12/14 2212 02/15/14 0648  AST 35 20  ALT 24 25  ALKPHOS 66 53  BILITOT 0.5 0.3  PROT 6.3 5.5*  ALBUMIN 2.4* 2.1*    Recent Labs Lab 02/12/14 2212  LIPASE 26  AMYLASE 28   CBC:  Recent Labs Lab 02/12/14 1304 02/13/14 0500 02/14/14 0457 02/15/14 0648  WBC 28.2* 21.9* 22.6* 6.2  NEUTROABS 23.8*  --  20.6* 3.9  HGB 14.0 11.6* 11.7* 11.4*  HCT 39.3 33.4* 33.2* 33.3*  MCV 86.9 87.4 85.8 87.9  PLT 161 130* 115* 105*   Cardiac Enzymes:  Recent Labs Lab 02/12/14 2212 02/13/14 0412 02/13/14 0844 02/13/14 1245 02/14/14 0457  CKTOTAL 547*  --  493*  --  182  CKMB  --   --  4.4*  --  4.2*  TROPONINI <0.30 <0.30  --  <0.30  --    BNP (last 3 results)  Recent Labs  02/12/14 2212  PROBNP 1062.0*   CBG:  Recent Labs Lab 02/12/14 2313  GLUCAP 162*    Micro Recent Results (from the past 240 hour(s))  URINE CULTURE     Status: None   Collection Time    02/12/14  7:21 PM      Result Value Ref Range Status   Specimen Description URINE, CLEAN CATCH   Final   Special Requests NONE   Final   Culture  Setup Time     Final   Value: 02/12/2014 19:58     Performed at [place]     Final   Value: NO GROWTH     Performed at [facility]   Culture      Final   Value: NO GROWTH     Performed at [facility]   Report Status 02/14/2014 FINAL   Final  CULTURE, BLOOD (ROUTINE X 2)     Status: None   Collection Time    02/12/14  7:30 PM      Result Value Ref Range Status   Specimen Description BLOOD RIGHT FOREARM   Final   Special Requests BOTTLES DRAWN AEROBIC AND ANAEROBIC 6CCBLUE 5CCRED   Final   Culture  Setup Time     Final   Value: 02/13/2014  01:15     Performed at [facility]   Culture     Final   Value: STREPTOCOCCUS PNEUMONIAE     Note: Gram Stain Report Called to,Read Back By and Verified With: Mardene Celeste ON 02/13/2014 AT 8:32P BY Dennard Nip     Performed at [facility]   Report Status 02/15/2014 FINAL   Final   Organism ID, Bacteria STREPTOCOCCUS PNEUMONIAE   Final  CULTURE, BLOOD (ROUTINE X 2)     Status: None   Collection Time    02/12/14  7:45 PM      Result Value Ref Range Status   Specimen Description BLOOD HAND LEFT   Final   Special Requests BOTTLES DRAWN AEROBIC AND ANAEROBIC 5CC   Final   Culture  Setup Time     Final   Value: 02/13/2014 01:15     Performed at [facility]   Culture     Final   Value: STREPTOCOCCUS PNEUMONIAE     Note: SUSCEPTIBILITIES PERFORMED ON PREVIOUS CULTURE WITHIN THE LAST 5 DAYS.     Note: Gram Stain Report Called to,Read Back By and Verified With: Mardene Celeste ON 02/13/2014 AT 8:32P BY Dennard Nip     Performed at [facility]   Report Status 02/15/2014 FINAL   Final  MRSA PCR SCREENING     Status: None   Collection Time    02/12/14 11:12 PM      Result Value Ref Range Status   MRSA by PCR NEGATIVE  NEGATIVE Final   Comment:            The GeneXpert MRSA Assay (FDA     approved for NASAL specimens     only), is one component of a     comprehensive MRSA colonization     surveillance program. It is not     intended to diagnose MRSA     infection nor to guide or     monitor treatment for     MRSA infections.  RESPIRATORY VIRUS PANEL      Status: None   Collection Time    02/13/14  2:03 AM      Result Value Ref Range Status   Source - RVPAN NASAL SWAB   Corrected   Comment: CORRECTED ON 04/29 AT 2038: PREVIOUSLY REPORTED AS NASAL SWAB   Respiratory Syncytial Virus A NOT DETECTED   Final   Respiratory Syncytial Virus B NOT DETECTED   Final   Influenza A NOT DETECTED   Final   Influenza B NOT DETECTED   Final   Parainfluenza 1 NOT DETECTED   Final   Parainfluenza 2 NOT DETECTED   Final   Parainfluenza 3 NOT DETECTED   Final   Metapneumovirus NOT DETECTED   Final   Rhinovirus NOT DETECTED   Final   Adenovirus NOT DETECTED   Final   Influenza A H1 NOT DETECTED   Final   Influenza A H3 NOT DETECTED   Final   Comment: (NOTE)           Normal Reference Range for each Analyte: NOT DETECTED     Testing performed using the Luminex xTAG Respiratory Viral Panel test     kit.     This test was developed and its performance characteristics determined     by [facility]. It has not been cleared or approved by the [place]     Food and Drug Administration. This test is used for clinical purposes.  It should not be regarded as investigational or for research. This     laboratory is certified under the [place] (CLIA) as qualified to perform high complexity     clinical laboratory testing.     Performed at [place], BLOOD (ROUTINE X 2)     Status: None   Collection Time    02/13/14  8:44 AM      Result Value Ref Range Status   Specimen Description BLOOD LEFT ANTECUBITAL   Final   Special Requests     Final   Value: BOTTLES DRAWN AEROBIC AND ANAEROBIC 10CC BLUE, [place] RED   Culture  Setup Time     Final   Value: 02/13/2014 14:22     Performed at [facility]   Culture     Final   Value:        BLOOD CULTURE RECEIVED NO GROWTH TO DATE CULTURE WILL BE HELD FOR 5 DAYS BEFORE ISSUING A FINAL NEGATIVE REPORT     Performed at [facility]   Report  Status PENDING   Incomplete  CULTURE, BLOOD (ROUTINE X 2)     Status: None   Collection Time    02/13/14  8:52 AM      Result Value Ref Range Status   Specimen Description BLOOD LEFT HAND   Final   Special Requests BOTTLES DRAWN AEROBIC AND ANAEROBIC 10CC   Final   Culture  Setup Time     Final   Value: 02/13/2014 14:22     Performed at [facility]   Culture     Final   Value:        BLOOD CULTURE RECEIVED NO GROWTH TO DATE CULTURE WILL BE HELD FOR 5 DAYS BEFORE ISSUING A FINAL NEGATIVE REPORT     Performed at [facility]   Report Status PENDING   Incomplete     Studies: No results found. 2D Echocardiogram without contrast  02/13/14 Study Conclusions- Left ventricle: The cavity size was mildly dilated. Wall thickness was normal. Systolic function was mildly reduced. The estimated ejection fraction was in the range of 45% to 50%. Diffuse hypokinesis. - Right ventricle: The cavity size was moderately dilated. - Right atrium: The atrium was moderately dilated. - Atrial septum: No defect or patent foramen ovale was identified. Aortic valve: Mildly thickened leaflets. Doppler: There was no stenosis. Mitral valve: Mildly thickened leaflets . Doppler: No significant regurgitation. Pulmonic valve: Structurally normal valve. Cusp separation was normal. Doppler: Transvalvular velocity was within the normal range. Trivial regurgitation. Tricuspid valve: Structurally normal valve. Leaflet separation was normal. Doppler: Transvalvular velocity was within the normal range. Mild regurgitation. Transthoracic echocardiography. M-mode, complete 2D, spectral Doppler, and color Doppler.   Prepared and Electronically Authenticated by Jenkins Rouge 2015-04-28T15:31:[id number]    Scheduled Meds: . cefTRIAXone (ROCEPHIN)  IV  2 g Intravenous Q12H  . heparin  5,000 Units Subcutaneous 3 times per day  . levothyroxine  100 mcg Oral QAC breakfast  . [START ON 02/18/2014] Vitamin D (Ergocalciferol)   50,000 Units Oral Q7 days   Continuous Infusions: . sodium chloride 250 mL (02/15/14 0436)       Time spent: 35 minutes    Raquel Sarna A Johnson PA-S [place], PA-C. [facility] Pager [phone] If 7PM-7AM, please contact night-coverage at [email or web address], password [facility] 02/15/2014, 1:23 PM  LOS: 3 days      Addendum  Patient seen and examined, chart  and data base reviewed.  I agree with the above assessment and plan.  For full details please see Mrs. Imogene Burn PA note.  Septic shock, no obvious source, cardiology for TEE and ID to see.   Birdie Hopes, MD [facility] Pager: [phone] 02/15/2014, 4:46 PM

## 2014-02-15 NOTE — Plan of Care (Signed)
Problem: Phase I Progression Outcomes Goal: Initial discharge plan identified Outcome: Completed/Met Date Met:  02/15/14 To return home with wife

## 2014-02-15 NOTE — Consult Note (Signed)
[facility] for Infectious Disease    Date of Admission:  02/12/2014  Date of Consult:  02/15/2014  Reason for Consult: Pneumococcal bacteremia Referring Physician: Dr. Hartford Poli   HPI: Jeffrey Guerrero is an 63 y.o. male admitted with syncopal event and found to be in septic shock with Streptococcal Pneumonaie Bacteremia. NO PNA on CXR. No LP done. No clear cut history of Otitis media or source. HIV status unknown.    Past Medical History  Diagnosis Date  . Hypertension   . Thyroid disease   . Hypothyroidism   . Arthritis     "knees" (02/13/2014)    Past Surgical History  Procedure Laterality Date  . Tonsillectomy    . Appendectomy    ergies:   No Known Allergies   Medications: I have reviewed patients current medications as documented in Epic Anti-infectives   Start     Dose/Rate Route Frequency Ordered Stop   02/15/14 2200  cefTRIAXone (ROCEPHIN) 2 g in dextrose 5 % 50 mL IVPB     2 g 100 mL/hr over 30 Minutes Intravenous Every 12 hours 02/15/14 1115     02/15/14 1000  cefTRIAXone (ROCEPHIN) 2 g in dextrose 5 % 50 mL IVPB  Status:  Discontinued     2 g 100 mL/hr over 30 Minutes Intravenous Every 24 hours 02/15/14 0919 02/15/14 1115   02/13/14 1200  vancomycin (VANCOCIN) IVPB 750 mg/150 ml premix  Status:  Discontinued     750 mg 150 mL/hr over 60 Minutes Intravenous Every 12 hours 02/13/14 0127 02/15/14 0918   02/13/14 0130  vancomycin (VANCOCIN) IVPB 750 mg/150 ml premix     750 mg 150 mL/hr over 60 Minutes Intravenous  Once 02/13/14 0127 02/13/14 0258   02/12/14 2230  piperacillin-tazobactam (ZOSYN) IVPB 3.375 g  Status:  Discontinued     3.375 g 12.5 mL/hr over 240 Minutes Intravenous 3 times per day 02/12/14 2223 02/14/14 0922   02/12/14 2015  azithromycin (ZITHROMAX) 500 mg in dextrose 5 % 250 mL IVPB     500 mg 250 mL/hr over 60 Minutes Intravenous  Once 02/12/14 2000 02/12/14 2138   02/12/14 1945  cefTRIAXone (ROCEPHIN) 1 g in dextrose 5 % 50 mL IVPB     1 g 100 mL/hr over 30 Minutes Intravenous  Once 02/12/14 1941 02/12/14 2032      Social History:  reports that he has never smoked. He has never used smokeless tobacco. He reports that he drinks about 1.2 ounces of alcohol per week. He reports that he does not use illicit drugs.  History reviewed. No pertinent family history.  As in HPI and primary teams notes otherwise 12 point review of systems is negative  Blood pressure 144/86, pulse 63, temperature 98.4 F (36.9 C), temperature source Oral, resp. rate 18, height _0  (1.854 m), weight 210 lb 4.8 oz (95.391 kg), SpO2 96.00%.   General: Alert and awake, oriented x3, not in any acute distress. HEENT: anicteric sclera, , EOMI, oropharynx clear and without exudate,  CVS regular rate, normal r,  no murmur rubs or gallops Chest: clear to auscultation bilaterally, no wheezing, rales or rhonchi Abdomen: soft nontender, nondistended, normal bowel sounds, Skin: no rashes Neuro: nonfocal, strength and sensation intact   Results for orders placed during the hospital encounter of 02/12/14 (from the past 48 hour(s))  LEGIONELLA ANTIGEN, URINE     Status: None   Collection Time    02/14/14  2:55 AM      Result  Value Ref Range   Specimen Description URINE, RANDOM     Special Requests NONE     Legionella Antigen, Urine       Value: Negative for Legionella pneumophilia serogroup 1     Performed at [facility]   Report Status 02/14/2014 FINAL    STREP PNEUMONIAE URINARY ANTIGEN     Status: None   Collection Time    02/14/14  2:55 AM      Result Value Ref Range   Strep Pneumo Urinary Antigen NEGATIVE  NEGATIVE   Comment:            Infection due to S. pneumoniae     cannot be absolutely ruled out     since the antigen present     may be below the detection limit     of the test.  CBC WITH DIFFERENTIAL     Status: Abnormal   Collection Time    02/14/14  4:57 AM      Result Value Ref Range   WBC 22.6 (*) 4.0 - 10.5 K/uL    RBC 3.87 (*) 4.22 - 5.81 MIL/uL   Hemoglobin 11.7 (*) 13.0 - 17.0 g/dL   HCT 33.2 (*) 39.0 - 52.0 %   MCV 85.8  78.0 - 100.0 fL   MCH 30.2  26.0 - 34.0 pg   MCHC 35.2  30.0 - 36.0 g/dL   RDW 14.3  11.5 - 15.5 %   Platelets 115 (*) 150 - 400 K/uL   Comment: PLATELET COUNT CONFIRMED BY SMEAR     REPEATED TO VERIFY   Neutrophils Relative % 91 (*) 43 - 77 %   Neutro Abs 20.6 (*) 1.7 - 7.7 K/uL   Lymphocytes Relative 3 (*) 12 - 46 %   Lymphs Abs 0.7  0.7 - 4.0 K/uL   Monocytes Relative 6  3 - 12 %   Monocytes Absolute 1.4 (*) 0.1 - 1.0 K/uL   Eosinophils Relative 0  0 - 5 %   Eosinophils Absolute 0.0  0.0 - 0.7 K/uL   Basophils Relative 0  0 - 1 %   Basophils Absolute 0.0  0.0 - 0.1 K/uL  BASIC METABOLIC PANEL     Status: Abnormal   Collection Time    02/14/14  4:57 AM      Result Value Ref Range   Sodium 140  137 - 147 mEq/L   Potassium 3.4 (*) 3.7 - 5.3 mEq/L   Chloride 107  96 - 112 mEq/L   CO2 22  19 - 32 mEq/L   Glucose, Bld 199 (*) 70 - 99 mg/dL   BUN 28 (*) 6 - 23 mg/dL   Creatinine, Ser 1.14  0.50 - 1.35 mg/dL   Calcium 7.9 (*) 8.4 - 10.5 mg/dL   GFR calc non Af Amer 66 (*) >90 mL/min   GFR calc Af Amer 77 (*) >90 mL/min   Comment: (NOTE)     The eGFR has been calculated using the CKD EPI equation.     This calculation has not been validated in all clinical situations.     eGFR's persistently <90 mL/min signify possible Chronic Kidney     Disease.  MAGNESIUM     Status: Abnormal   Collection Time    02/14/14  4:57 AM      Result Value Ref Range   Magnesium 2.7 (*) 1.5 - 2.5 mg/dL  PHOSPHORUS     Status: None   Collection Time  02/14/14  4:57 AM      Result Value Ref Range   Phosphorus 2.5  2.3 - 4.6 mg/dL  LACTIC ACID, PLASMA     Status: None   Collection Time    02/14/14  4:57 AM      Result Value Ref Range   Lactic Acid, Venous 1.2  0.5 - 2.2 mmol/L  CK TOTAL AND CKMB     Status: Abnormal   Collection Time    02/14/14  4:57 AM      Result Value Ref  Range   Total CK 182  7 - 232 U/L   CK, MB 4.2 (*) 0.3 - 4.0 ng/mL   Relative Index 2.3  0.0 - 2.5  PROCALCITONIN     Status: None   Collection Time    02/14/14  4:57 AM      Result Value Ref Range   Procalcitonin 91.52     Comment:            Interpretation:     PCT >= 10 ng/mL:     Important systemic inflammatory response,     almost exclusively due to severe bacterial     sepsis or septic shock.     (NOTE)             ICU PCT Algorithm               Non ICU PCT Algorithm        ----------------------------     ------------------------------             PCT < 0.25 ng/mL                 PCT < 0.1 ng/mL         Stopping of antibiotics            Stopping of antibiotics           strongly encouraged.               strongly encouraged.        ----------------------------     ------------------------------           PCT level decrease by               PCT < 0.25 ng/mL           >= 80% from peak PCT           OR PCT 0.25 - 0.5 ng/mL          Stopping of antibiotics                                                 encouraged.         Stopping of antibiotics               encouraged.        ----------------------------     ------------------------------           PCT level decrease by              PCT >= 0.25 ng/mL           < 80% from peak PCT            AND PCT >= 0.5 ng/mL            Continuing antibiotics  encouraged.           Continuing antibiotics                encouraged.        ----------------------------     ------------------------------         PCT level increase compared          PCT > 0.5 ng/mL             with peak PCT AND              PCT >= 0.5 ng/mL             Escalation of antibiotics                                              strongly encouraged.          Escalation of antibiotics            strongly encouraged.  URINALYSIS, ROUTINE W REFLEX MICROSCOPIC     Status: Abnormal   Collection Time    02/14/14   8:48 PM      Result Value Ref Range   Color, Urine YELLOW  YELLOW   APPearance CLEAR  CLEAR   Specific Gravity, Urine 1.027  1.005 - 1.030   pH 6.0  5.0 - 8.0   Glucose, UA NEGATIVE  NEGATIVE mg/dL   Hgb urine dipstick TRACE (*) NEGATIVE   Bilirubin Urine NEGATIVE  NEGATIVE   Ketones, ur NEGATIVE  NEGATIVE mg/dL   Protein, ur NEGATIVE  NEGATIVE mg/dL   Urobilinogen, UA 0.2  0.0 - 1.0 mg/dL   Nitrite NEGATIVE  NEGATIVE   Leukocytes, UA NEGATIVE  NEGATIVE  URINE MICROSCOPIC-ADD ON     Status: None   Collection Time    02/14/14  8:48 PM      Result Value Ref Range   Squamous Epithelial / LPF RARE  RARE   WBC, UA 0-2  <3 WBC/hpf   RBC / HPF 0-2  <3 RBC/hpf  CBC WITH DIFFERENTIAL     Status: Abnormal   Collection Time    02/15/14  6:48 AM      Result Value Ref Range   WBC 6.2  4.0 - 10.5 K/uL   RBC 3.79 (*) 4.22 - 5.81 MIL/uL   Hemoglobin 11.4 (*) 13.0 - 17.0 g/dL   Comment: CONSISTENT WITH PREVIOUS RESULT   HCT 33.3 (*) 39.0 - 52.0 %   MCV 87.9  78.0 - 100.0 fL   MCH 30.1  26.0 - 34.0 pg   MCHC 34.2  30.0 - 36.0 g/dL   RDW 14.7  11.5 - 15.5 %   Platelets 105 (*) 150 - 400 K/uL   Comment: CONSISTENT WITH PREVIOUS RESULT   Neutrophils Relative % 63  43 - 77 %   Lymphocytes Relative 25  12 - 46 %   Monocytes Relative 12  3 - 12 %   Eosinophils Relative 0  0 - 5 %   Basophils Relative 0  0 - 1 %   Band Neutrophils 0  0 - 10 %   Metamyelocytes Relative 0     Myelocytes 0     Promyelocytes Absolute 0     Blasts 0     nRBC 0  0 /100 WBC   Neutro Abs 3.9  1.7 - 7.7 K/uL  Lymphs Abs 1.6  0.7 - 4.0 K/uL   Monocytes Absolute 0.7  0.1 - 1.0 K/uL   Eosinophils Absolute 0.0  0.0 - 0.7 K/uL   Basophils Absolute 0.0  0.0 - 0.1 K/uL  COMPREHENSIVE METABOLIC PANEL     Status: Abnormal   Collection Time    02/15/14  6:48 AM      Result Value Ref Range   Sodium 142  137 - 147 mEq/L   Potassium 3.4 (*) 3.7 - 5.3 mEq/L   Chloride 108  96 - 112 mEq/L   CO2 24  19 - 32 mEq/L    Glucose, Bld 106 (*) 70 - 99 mg/dL   BUN 26 (*) 6 - 23 mg/dL   Creatinine, Ser 1.03  0.50 - 1.35 mg/dL   Calcium 7.7 (*) 8.4 - 10.5 mg/dL   Total Protein 5.5 (*) 6.0 - 8.3 g/dL   Albumin 2.1 (*) 3.5 - 5.2 g/dL   AST 20  0 - 37 U/L   ALT 25  0 - 53 U/L   Alkaline Phosphatase 53  39 - 117 U/L   Total Bilirubin 0.3  0.3 - 1.2 mg/dL   GFR calc non Af Amer 75 (*) >90 mL/min   GFR calc Af Amer 87 (*) >90 mL/min   Comment: (NOTE)     The eGFR has been calculated using the CKD EPI equation.     This calculation has not been validated in all clinical situations.     eGFR's persistently <90 mL/min signify possible Chronic Kidney     Disease.      Component Value Date/Time   SDES URINE, RANDOM 02/14/2014 0255   SPECREQUEST NONE 02/14/2014 0255   CULT  Value:        BLOOD CULTURE RECEIVED NO GROWTH TO DATE CULTURE WILL BE HELD FOR 5 DAYS BEFORE ISSUING A FINAL NEGATIVE REPORT Performed at [facility] 02/13/2014 0852   REPTSTATUS 02/14/2014 FINAL 02/14/2014 0255   No results found.   Recent Results (from the past 720 hour(s))  URINE CULTURE     Status: None   Collection Time    02/12/14  7:21 PM      Result Value Ref Range Status   Specimen Description URINE, CLEAN CATCH   Final   Special Requests NONE   Final   Culture  Setup Time     Final   Value: 02/12/2014 19:58     Performed at [facility]     Final   Value: NO GROWTH     Performed at [facility]   Culture     Final   Value: NO GROWTH     Performed at [facility]   Report Status 02/14/2014 FINAL   Final  CULTURE, BLOOD (ROUTINE X 2)     Status: None   Collection Time    02/12/14  7:30 PM      Result Value Ref Range Status   Specimen Description BLOOD RIGHT FOREARM   Final   Special Requests BOTTLES DRAWN AEROBIC AND ANAEROBIC 6CCBLUE 5CCRED   Final   Culture  Setup Time     Final   Value: 02/13/2014 01:15     Performed at [facility]   Culture     Final   Value:  STREPTOCOCCUS PNEUMONIAE     Note: Gram Stain Report Called to,Read Back By and Verified With: Mardene Celeste ON 02/13/2014 AT 8:32P BY Dennard Nip  Performed at [facility]   Report Status 02/15/2014 FINAL   Final   Organism ID, Bacteria STREPTOCOCCUS PNEUMONIAE   Final  CULTURE, BLOOD (ROUTINE X 2)     Status: None   Collection Time    02/12/14  7:45 PM      Result Value Ref Range Status   Specimen Description BLOOD HAND LEFT   Final   Special Requests BOTTLES DRAWN AEROBIC AND ANAEROBIC 5CC   Final   Culture  Setup Time     Final   Value: 02/13/2014 01:15     Performed at [facility]   Culture     Final   Value: STREPTOCOCCUS PNEUMONIAE     Note: SUSCEPTIBILITIES PERFORMED ON PREVIOUS CULTURE WITHIN THE LAST 5 DAYS.     Note: Gram Stain Report Called to,Read Back By and Verified With: Mardene Celeste ON 02/13/2014 AT 8:32P BY Dennard Nip     Performed at [facility]   Report Status 02/15/2014 FINAL   Final  MRSA PCR SCREENING     Status: None   Collection Time    02/12/14 11:12 PM      Result Value Ref Range Status   MRSA by PCR NEGATIVE  NEGATIVE Final   Comment:            The GeneXpert MRSA Assay (FDA     approved for NASAL specimens     only), is one component of a     comprehensive MRSA colonization     surveillance program. It is not     intended to diagnose MRSA     infection nor to guide or     monitor treatment for     MRSA infections.  RESPIRATORY VIRUS PANEL     Status: None   Collection Time    02/13/14  2:03 AM      Result Value Ref Range Status   Source - RVPAN NASAL SWAB   Corrected   Comment: CORRECTED ON 04/29 AT 2038: PREVIOUSLY REPORTED AS NASAL SWAB   Respiratory Syncytial Virus A NOT DETECTED   Final   Respiratory Syncytial Virus B NOT DETECTED   Final   Influenza A NOT DETECTED   Final   Influenza B NOT DETECTED   Final   Parainfluenza 1 NOT DETECTED   Final   Parainfluenza 2 NOT DETECTED   Final   Parainfluenza 3 NOT DETECTED    Final   Metapneumovirus NOT DETECTED   Final   Rhinovirus NOT DETECTED   Final   Adenovirus NOT DETECTED   Final   Influenza A H1 NOT DETECTED   Final   Influenza A H3 NOT DETECTED   Final   Comment: (NOTE)           Normal Reference Range for each Analyte: NOT DETECTED     Testing performed using the Luminex xTAG Respiratory Viral Panel test     kit.     This test was developed and its performance characteristics determined     by [facility]. It has not been cleared or approved by the [place]     Food and Drug Administration. This test is used for clinical purposes.     It should not be regarded as investigational or for research. This     laboratory is certified under the [place] (CLIA) as qualified to perform high complexity     clinical laboratory testing.  Performed at [facility], BLOOD (ROUTINE X 2)     Status: None   Collection Time    02/13/14  8:44 AM      Result Value Ref Range Status   Specimen Description BLOOD LEFT ANTECUBITAL   Final   Special Requests     Final   Value: BOTTLES DRAWN AEROBIC AND ANAEROBIC 10CC BLUE, [place] RED   Culture  Setup Time     Final   Value: 02/13/2014 14:22     Performed at [facility]   Culture     Final   Value:        BLOOD CULTURE RECEIVED NO GROWTH TO DATE CULTURE WILL BE HELD FOR 5 DAYS BEFORE ISSUING A FINAL NEGATIVE REPORT     Performed at [facility]   Report Status PENDING   Incomplete  CULTURE, BLOOD (ROUTINE X 2)     Status: None   Collection Time    02/13/14  8:52 AM      Result Value Ref Range Status   Specimen Description BLOOD LEFT HAND   Final   Special Requests BOTTLES DRAWN AEROBIC AND ANAEROBIC 10CC   Final   Culture  Setup Time     Final   Value: 02/13/2014 14:22     Performed at [facility]   Culture     Final   Value:        BLOOD CULTURE RECEIVED NO GROWTH TO DATE CULTURE WILL BE HELD FOR 5 DAYS BEFORE ISSUING A  FINAL NEGATIVE REPORT     Performed at [facility]   Report Status PENDING   Incomplete     Impression/Recommendation  Principal Problem:   Septic shock Active Problems:   Unspecified hypothyroidism   Essential hypertension, benign   Leukocytopenia, unspecified   Gram-positive bacteremia   ARF (acute renal failure)   HTN (hypertension)   Jeffrey Guerrero is a 63 y.o. male with  Pneumococcal bacteremia and septic shock  #1 Pneumococcal Bacteremia and Septic shock:  Not clear what the source was  Not much point in doing LP at this point.  Would increase to Q 12 hours to maximiaze CNS penetration  Would ask Cardiology to evaluate heart valves with TEE  Patient needs HIV test  #2 Screening: check for HIV and hepatitis panel.  Dr. Johnnye Sima taking over service tomorrow.    02/15/2014, 6:29 PM   Thank you so much for this interesting consult  [place] [facility] [place] [phone] (pager) [phone] (office) 02/15/2014, 6:29 PM  [place] 02/15/2014, 6:29 PM

## 2014-02-16 ENCOUNTER — Encounter (HOSPITAL_COMMUNITY): Payer: Self-pay | Admitting: *Deleted

## 2014-02-16 ENCOUNTER — Inpatient Hospital Stay (HOSPITAL_COMMUNITY): Payer: 59

## 2014-02-16 ENCOUNTER — Encounter (HOSPITAL_COMMUNITY)
Admission: EM | Disposition: A | Discharge status: Home Health | Payer: 59 | Source: Home / Self Care | Attending: Internal Medicine

## 2014-02-16 DIAGNOSIS — I371 Nonrheumatic pulmonary valve insufficiency: Secondary | ICD-10-CM | POA: Insufficient documentation

## 2014-02-16 DIAGNOSIS — I330 Acute and subacute infective endocarditis: Secondary | ICD-10-CM

## 2014-02-16 DIAGNOSIS — I339 Acute and subacute endocarditis, unspecified: Secondary | ICD-10-CM

## 2014-02-16 DIAGNOSIS — A419 Sepsis, unspecified organism: Secondary | ICD-10-CM

## 2014-02-16 DIAGNOSIS — A403 Sepsis due to Streptococcus pneumoniae: Principal | ICD-10-CM

## 2014-02-16 HISTORY — PX: TEE WITHOUT CARDIOVERSION: SHX5443

## 2014-02-16 HISTORY — DX: Acute and subacute infective endocarditis: I33.0

## 2014-02-16 HISTORY — DX: Nonrheumatic pulmonary valve insufficiency: I37.1

## 2014-02-16 LAB — HEPATITIS PANEL, ACUTE
HCV Ab: NEGATIVE
HEP A IGM: NONREACTIVE
Hep B C IgM: NONREACTIVE
Hepatitis B Surface Ag: NEGATIVE

## 2014-02-16 LAB — CBC WITH DIFFERENTIAL/PLATELET
BASOS ABS: 0 10*3/uL (ref 0.0–0.1)
Basophils Relative: 0 % (ref 0–1)
Eosinophils Absolute: 0 10*3/uL (ref 0.0–0.7)
Eosinophils Relative: 0 % (ref 0–5)
HEMATOCRIT: 36 % — AB (ref 39.0–52.0)
Hemoglobin: 12.5 g/dL — ABNORMAL LOW (ref 13.0–17.0)
LYMPHS ABS: 1.5 10*3/uL (ref 0.7–4.0)
Lymphocytes Relative: 23 % (ref 12–46)
MCH: 30.1 pg (ref 26.0–34.0)
MCHC: 34.7 g/dL (ref 30.0–36.0)
MCV: 86.7 fL (ref 78.0–100.0)
MONO ABS: 1.1 10*3/uL — AB (ref 0.1–1.0)
Monocytes Relative: 17 % — ABNORMAL HIGH (ref 3–12)
Neutro Abs: 4 10*3/uL (ref 1.7–7.7)
Neutrophils Relative %: 60 % (ref 43–77)
PLATELETS: 105 10*3/uL — AB (ref 150–400)
RBC: 4.15 MIL/uL — ABNORMAL LOW (ref 4.22–5.81)
RDW: 14.1 % (ref 11.5–15.5)
WBC: 6.6 10*3/uL (ref 4.0–10.5)

## 2014-02-16 LAB — HIV ANTIBODY (ROUTINE TESTING W REFLEX): HIV 1&2 Ab, 4th Generation: NONREACTIVE

## 2014-02-16 SURGERY — ECHOCARDIOGRAM, TRANSESOPHAGEAL
Anesthesia: Moderate Sedation

## 2014-02-16 MED ORDER — HEPARIN BOLUS VIA INFUSION
3000.0000 [IU] | Freq: Once | INTRAVENOUS | Status: AC
  Administered 2014-02-16: 3000 [IU] via INTRAVENOUS
  Filled 2014-02-16: qty 3000

## 2014-02-16 MED ORDER — MIDAZOLAM HCL 5 MG/ML IJ SOLN
INTRAMUSCULAR | Status: AC
  Filled 2014-02-16: qty 2

## 2014-02-16 MED ORDER — MIDAZOLAM HCL 10 MG/2ML IJ SOLN
INTRAMUSCULAR | Status: DC | PRN
  Administered 2014-02-16 (×2): 2 mg via INTRAVENOUS

## 2014-02-16 MED ORDER — FENTANYL CITRATE 0.05 MG/ML IJ SOLN
INTRAMUSCULAR | Status: DC | PRN
  Administered 2014-02-16: 25 ug via INTRAVENOUS

## 2014-02-16 MED ORDER — IOHEXOL 300 MG/ML  SOLN
80.0000 mL | Freq: Once | INTRAMUSCULAR | Status: AC | PRN
  Administered 2014-02-16: 80 mL via INTRAVENOUS

## 2014-02-16 MED ORDER — POTASSIUM CHLORIDE CRYS ER 20 MEQ PO TBCR
40.0000 meq | EXTENDED_RELEASE_TABLET | Freq: Once | ORAL | Status: AC
  Administered 2014-02-16: 40 meq via ORAL
  Filled 2014-02-16: qty 2

## 2014-02-16 MED ORDER — LIDOCAINE VISCOUS 2 % MT SOLN
OROMUCOSAL | Status: AC
  Filled 2014-02-16: qty 15

## 2014-02-16 MED ORDER — LIDOCAINE VISCOUS 2 % MT SOLN
OROMUCOSAL | Status: DC | PRN
  Administered 2014-02-16: 7.5 mL via OROMUCOSAL

## 2014-02-16 MED ORDER — FENTANYL CITRATE 0.05 MG/ML IJ SOLN
INTRAMUSCULAR | Status: AC
  Filled 2014-02-16: qty 2

## 2014-02-16 MED ORDER — HEPARIN (PORCINE) IN NACL 100-0.45 UNIT/ML-% IJ SOLN
1750.0000 [IU]/h | INTRAMUSCULAR | Status: AC
  Administered 2014-02-16: 1350 [IU]/h via INTRAVENOUS
  Administered 2014-02-17 (×2): 1550 [IU]/h via INTRAVENOUS
  Administered 2014-02-17 – 2014-02-18 (×2): 1750 [IU]/h via INTRAVENOUS
  Filled 2014-02-16 (×5): qty 250

## 2014-02-16 MED ORDER — SODIUM CHLORIDE 0.9 % IV SOLN
Freq: Once | INTRAVENOUS | Status: AC
  Administered 2014-02-16: 500 mL via INTRAVENOUS

## 2014-02-16 MED ORDER — DIPHENHYDRAMINE HCL 50 MG/ML IJ SOLN
INTRAMUSCULAR | Status: AC
  Filled 2014-02-16: qty 1

## 2014-02-16 NOTE — Progress Notes (Signed)
[facility] PROGRESS NOTE   Jeffrey Guerrero UVO:[id number] DOB: April 13, 1951 DOA: 02/12/2014 PCP: [name], MD  HPI 63y/o M PMH HTN, hypothyroidism, and knee arthritis, presents to the ED from his PCP with a single episode of syncope from a standing position at home 4/27 am. Denies any HA, N/V, aura, CP, SOB etc. Did report feeling tired. Small laceration between eyes due to fall. While being evaluated in the ED he became severely hypotensive, febrile and tachycardic. WBC 28k, meeting sepsis criteria. Intensive work up with labs and imaging unable to identify a source. Admitted for Sepsis and persistent hypotension post IVF and Levophed.  Subjective: Pt laying comfortable in bed, NAD, no complaints.   Assessment/Plan:  Principal Problem:   Septic shock Active Problems:   Unspecified hypothyroidism   Essential hypertension, benign   Leukocytopenia, unspecified   Gram-positive bacteremia   ARF (acute renal failure)   HTN (hypertension)      Bacteremia / Endocarditis -Pulmonic valve vegetation and cavitary lesion found on TEE on 5/1.  Need further discussion with ID. -4/27 2 of 2 blood cultures: G+ cocci pairs=Strep pneumoniae; 4/28- blood cultures pending, no growth yet -CXR, Head & Abd CT, 2D Cardiac Echo, Renal US all negative for acute processes -Restart Rocephin, place a PICC line for long term abx post d/c -Consult ID: agree with TEE to further eval for endocarditis post Blood culture results -TEE by Dr. Dorris Carnes on 5/1 at 2:00 pm.  Cavitary lesion seen on TEE. -CTA chest ordered to further evaluate.   Septic shock -currently unidentified source of infection,sepsis developed during ED evaluation -became severely hypotensive in the ED resulting in 3L+ NS IVF and short duration of Levophed to raise BP -Multiple testing to id source, resp panel pending & Legionella negative -Resolved: PCT and lactic acid down trending, WBC 6.2  -Rocephin & Azithromycin x1 in ED,  Zosyn x 3 days-d/c 4/29, now continue on Vancomycin x3 days-d/c 4/30,  -Restart Rocephin  (4/30).  ID recommends 1 gm q 12 hours.    Syncope -Single episode, no warning feeling or prodrome, due to endocarditis and sepsis. -Head CT normal, LE Doppler-negative, D-dimer 3.42 (no PE s/s) -small healing lac to face -No noted arrythmia or mental status changes, d/c telemetry     Acute Kidney Injury/ATN -Resolving, acute issue due to severe hypotensive crisis/sepsis, no reported history -BUN down trending 35 to 26, sCr initially 1.80, now 1.03 down trending,  -no hydronephrosis on Abd CT, no acute issue on Renal U/S -UA shows +RBC & Hgb, recheck UA, monitor w/ BMP    Hypokalemia -continues to be low, 3.4 -Replete w/ KCl PO    Hypothyroidism -Chronic, elevated TSH, may be due to acute infection -Outpatient follow up, reevaluate Synthroid dosing    HTN -Chronic -Controlled with Amlodipine, but held due to current hypotensive condition -restart upon resolution of sepsis   Code Status:FULL  Family Communication: Plan discussed with the patient. Daughter at bedside. Disposition Plan: Remains inpatient.  D/C to home when appropriate.  Will need HHRN for IV antibiotics.   Antibiotics: Anti-infectives   Start     Dose/Rate Route Frequency Ordered Stop   02/15/14 2200  [MAR Hold]  cefTRIAXone (ROCEPHIN) 2 g in dextrose 5 % 50 mL IVPB     (On MAR Hold since 02/16/14 1316)   2 g 100 mL/hr over 30 Minutes Intravenous Every 12 hours 02/15/14 1115     02/15/14 1000  cefTRIAXone (ROCEPHIN) 2 g in dextrose 5 % 50 mL  IVPB  Status:  Discontinued     2 g 100 mL/hr over 30 Minutes Intravenous Every 24 hours 02/15/14 0919 02/15/14 1115   02/13/14 1200  vancomycin (VANCOCIN) IVPB 750 mg/150 ml premix  Status:  Discontinued     750 mg 150 mL/hr over 60 Minutes Intravenous Every 12 hours 02/13/14 0127 02/15/14 0918   02/13/14 0130  vancomycin (VANCOCIN) IVPB 750 mg/150 ml premix     750 mg 150 mL/hr  over 60 Minutes Intravenous  Once 02/13/14 0127 02/13/14 0258   02/12/14 2230  piperacillin-tazobactam (ZOSYN) IVPB 3.375 g  Status:  Discontinued     3.375 g 12.5 mL/hr over 240 Minutes Intravenous 3 times per day 02/12/14 2223 02/14/14 0922   02/12/14 2015  azithromycin (ZITHROMAX) 500 mg in dextrose 5 % 250 mL IVPB     500 mg 250 mL/hr over 60 Minutes Intravenous  Once 02/12/14 2000 02/12/14 2138   02/12/14 1945  cefTRIAXone (ROCEPHIN) 1 g in dextrose 5 % 50 mL IVPB     1 g 100 mL/hr over 30 Minutes Intravenous  Once 02/12/14 1941 02/12/14 2032      Objective: Filed Vitals:   02/16/14 1544  BP: 160/94  Pulse: 66  Temp:   Resp: 30    Intake/Output Summary (Last 24 hours) at 02/16/14 1600 Last data filed at 02/16/14 1545  Gross per 24 hour  Intake 1407.5 ml  Output   2550 ml  Net -1142.5 ml   Filed Weights   02/15/14 0500 02/15/14 0900 02/16/14 0427  Weight: 96.752 kg (213 lb 4.8 oz) 95.391 kg (210 lb 4.8 oz) 95.7 kg (210 lb 15.7 oz)    Exam: General: Alert and awake, oriented x3, not in any acute distress. Daughter at bedside. HEENT: anicteric sclera, pupils reactive to light and accommodation, EOMI CVS: S1-S2 clear, no murmur rubs or gallops Chest: clear to auscultation bilaterally, no wheezing, rales or rhonchi Abdomen: soft nontender, nondistended, normal bowel sounds, no organomegaly Extremities: no cyanosis, clubbing or edema noted bilaterally Neuro: Cranial nerves II-XII intact, no focal neurological deficits  Data Reviewed: Basic Metabolic Panel:  Recent Labs Lab 02/12/14 1304 02/12/14 2212 02/13/14 0500 02/14/14 0457 02/15/14 0648  NA 139  --  138 140 142  K 3.3*  --  3.5* 3.4* 3.4*  CL 97  --  105 107 108  CO2 25  --  $R'21 22 24  'oH$ GLUCOSE 155*  --  157* 199* 106*  BUN 35*  --  32* 28* 26*  CREATININE 1.80*  --  1.39* 1.14 1.03  CALCIUM 9.2  --  7.3* 7.9* 7.7*  MG  --  2.2 2.3 2.7*  --   PHOS  --  2.3 2.2* 2.5  --    Liver Function  Tests:  Recent Labs Lab 02/12/14 2212 02/15/14 0648  AST 35 20  ALT 24 25  ALKPHOS 66 53  BILITOT 0.5 0.3  PROT 6.3 5.5*  ALBUMIN 2.4* 2.1*    Recent Labs Lab 02/12/14 2212  LIPASE 26  AMYLASE 28   CBC:  Recent Labs Lab 02/12/14 1304 02/13/14 0500 02/14/14 0457 02/15/14 0648 02/16/14 0812  WBC 28.2* 21.9* 22.6* 6.2 6.6  NEUTROABS 23.8*  --  20.6* 3.9 4.0  HGB 14.0 11.6* 11.7* 11.4* 12.5*  HCT 39.3 33.4* 33.2* 33.3* 36.0*  MCV 86.9 87.4 85.8 87.9 86.7  PLT 161 130* 115* 105* 105*   Cardiac Enzymes:  Recent Labs Lab 02/12/14 2212 02/13/14 0412 02/13/14 0844 02/13/14 1245 02/14/14  [place]*  --  182  CKMB  --   --  4.4*  --  4.2*  TROPONINI <0.30 <0.30  --  <0.30  --    BNP (last 3 results)  Recent Labs  02/12/14 2212  PROBNP 1062.0*   CBG:  Recent Labs Lab 02/12/14 2313  GLUCAP 162*    Micro Recent Results (from the past 240 hour(s))  URINE CULTURE     Status: None   Collection Time    02/12/14  7:21 PM      Result Value Ref Range Status   Specimen Description URINE, CLEAN CATCH   Final   Special Requests NONE   Final   Culture  Setup Time     Final   Value: 02/12/2014 19:58     Performed at [facility]     Final   Value: NO GROWTH     Performed at [facility]   Culture     Final   Value: NO GROWTH     Performed at [facility]   Report Status 02/14/2014 FINAL   Final  CULTURE, BLOOD (ROUTINE X 2)     Status: None   Collection Time    02/12/14  7:30 PM      Result Value Ref Range Status   Specimen Description BLOOD RIGHT FOREARM   Final   Special Requests BOTTLES DRAWN AEROBIC AND ANAEROBIC 6CCBLUE 5CCRED   Final   Culture  Setup Time     Final   Value: 02/13/2014 01:15     Performed at [facility]   Culture     Final   Value: STREPTOCOCCUS PNEUMONIAE     Note: Gram Stain Report Called to,Read Back By and Verified With: Mardene Celeste ON 02/13/2014 AT 8:32P  BY WILEJ     Performed at [facility]   Report Status 02/15/2014 FINAL   Final   Organism ID, Bacteria STREPTOCOCCUS PNEUMONIAE   Final  CULTURE, BLOOD (ROUTINE X 2)     Status: None   Collection Time    02/12/14  7:45 PM      Result Value Ref Range Status   Specimen Description BLOOD HAND LEFT   Final   Special Requests BOTTLES DRAWN AEROBIC AND ANAEROBIC 5CC   Final   Culture  Setup Time     Final   Value: 02/13/2014 01:15     Performed at [facility]   Culture     Final   Value: STREPTOCOCCUS PNEUMONIAE     Note: SUSCEPTIBILITIES PERFORMED ON PREVIOUS CULTURE WITHIN THE LAST 5 DAYS.     Note: Gram Stain Report Called to,Read Back By and Verified With: Mardene Celeste ON 02/13/2014 AT 8:32P BY Dennard Nip     Performed at [facility]   Report Status 02/15/2014 FINAL   Final  MRSA PCR SCREENING     Status: None   Collection Time    02/12/14 11:12 PM      Result Value Ref Range Status   MRSA by PCR NEGATIVE  NEGATIVE Final   Comment:            The GeneXpert MRSA Assay (FDA     approved for NASAL specimens     only), is one component of a     comprehensive MRSA colonization     surveillance program. It is not     intended to diagnose MRSA  infection nor to guide or     monitor treatment for     MRSA infections.  RESPIRATORY VIRUS PANEL     Status: None   Collection Time    02/13/14  2:03 AM      Result Value Ref Range Status   Source - RVPAN NASAL SWAB   Corrected   Comment: CORRECTED ON 04/29 AT 2038: PREVIOUSLY REPORTED AS NASAL SWAB   Respiratory Syncytial Virus A NOT DETECTED   Final   Respiratory Syncytial Virus B NOT DETECTED   Final   Influenza A NOT DETECTED   Final   Influenza B NOT DETECTED   Final   Parainfluenza 1 NOT DETECTED   Final   Parainfluenza 2 NOT DETECTED   Final   Parainfluenza 3 NOT DETECTED   Final   Metapneumovirus NOT DETECTED   Final   Rhinovirus NOT DETECTED   Final   Adenovirus NOT DETECTED   Final   Influenza A H1  NOT DETECTED   Final   Influenza A H3 NOT DETECTED   Final   Comment: (NOTE)           Normal Reference Range for each Analyte: NOT DETECTED     Testing performed using the Luminex xTAG Respiratory Viral Panel test     kit.     This test was developed and its performance characteristics determined     by [facility]. It has not been cleared or approved by the [place]     Food and Drug Administration. This test is used for clinical purposes.     It should not be regarded as investigational or for research. This     laboratory is certified under the [place] (CLIA) as qualified to perform high complexity     clinical laboratory testing.     Performed at [facility], BLOOD (ROUTINE X 2)     Status: None   Collection Time    02/13/14  8:44 AM      Result Value Ref Range Status   Specimen Description BLOOD LEFT ANTECUBITAL   Final   Special Requests     Final   Value: BOTTLES DRAWN AEROBIC AND ANAEROBIC 10CC BLUE, [place] RED   Culture  Setup Time     Final   Value: 02/13/2014 14:22     Performed at [facility]   Culture     Final   Value:        BLOOD CULTURE RECEIVED NO GROWTH TO DATE CULTURE WILL BE HELD FOR 5 DAYS BEFORE ISSUING A FINAL NEGATIVE REPORT     Performed at [facility]   Report Status PENDING   Incomplete  CULTURE, BLOOD (ROUTINE X 2)     Status: None   Collection Time    02/13/14  8:52 AM      Result Value Ref Range Status   Specimen Description BLOOD LEFT HAND   Final   Special Requests BOTTLES DRAWN AEROBIC AND ANAEROBIC 10CC   Final   Culture  Setup Time     Final   Value: 02/13/2014 14:22     Performed at [facility]   Culture     Final   Value:        BLOOD CULTURE RECEIVED NO GROWTH TO DATE CULTURE WILL BE HELD FOR 5 DAYS BEFORE ISSUING A FINAL NEGATIVE REPORT     Performed at [facility]  Report Status PENDING   Incomplete     Studies: No results  found. 2D Echocardiogram without contrast  02/13/14 Study Conclusions- Left ventricle: The cavity size was mildly dilated. Wall thickness was normal. Systolic function was mildly reduced. The estimated ejection fraction was in the range of 45% to 50%. Diffuse hypokinesis. - Right ventricle: The cavity size was moderately dilated. - Right atrium: The atrium was moderately dilated. - Atrial septum: No defect or patent foramen ovale was identified. Aortic valve: Mildly thickened leaflets. Doppler: There was no stenosis. Mitral valve: Mildly thickened leaflets . Doppler: No significant regurgitation. Pulmonic valve: Structurally normal valve. Cusp separation was normal. Doppler: Transvalvular velocity was within the normal range. Trivial regurgitation. Tricuspid valve: Structurally normal valve. Leaflet separation was normal. Doppler: Transvalvular velocity was within the normal range. Mild regurgitation. Transthoracic echocardiography. M-mode, complete 2D, spectral Doppler, and color Doppler.   Prepared and Electronically Authenticated by Jenkins Rouge 2015-04-28T15:[id number]    Scheduled Meds: . [MAR HOLD] cefTRIAXone (ROCEPHIN)  IV  2 g Intravenous Q12H  . [MAR HOLD] heparin  5,000 Units Subcutaneous 3 times per day  . [facility] HOLD] levothyroxine  100 mcg Oral QAC breakfast  . potassium chloride  40 mEq Oral Once  . [facility] HOLD] Vitamin D (Ergocalciferol)  50,000 Units Oral Q7 days   Continuous Infusions: . sodium chloride 75 mL (02/16/14 0234)       Time spent: 35 minutes  Imogene Burn, PA-C. [facility] Pager [phone] If 7PM-7AM, please contact night-coverage at [email or web address], password [facility] 02/16/2014, 4:00 PM  LOS: 4 days

## 2014-02-16 NOTE — H&P (View-Only) (Signed)
  [facility]    Date of Admission:  02/12/2014  Date of Consult:  02/15/2014  Reason for Consult: Pneumococcal bacteremia Referring Physician: Dr. Elmahi   HPI: Jeffrey Guerrero is an 62 y.o. male admitted with syncopal event and found to be in septic shock with Streptococcal Pneumonaie Bacteremia. NO PNA on CXR. No LP done. No clear cut history of Otitis media or source. HIV status unknown.    Past Medical History  Diagnosis Date  . Hypertension   . Thyroid disease   . Hypothyroidism   . Arthritis     "knees" (02/13/2014)    Past Surgical History  Procedure Laterality Date  . Tonsillectomy    . Appendectomy    ergies:   No Known Allergies   Medications: I have reviewed patients current medications as documented in Epic Anti-infectives   Start     Dose/Rate Route Frequency Ordered Stop   02/15/14 2200  cefTRIAXone (ROCEPHIN) 2 g in dextrose 5 % 50 mL IVPB     2 g 100 mL/hr over 30 Minutes Intravenous Every 12 hours 02/15/14 1115     02/15/14 1000  cefTRIAXone (ROCEPHIN) 2 g in dextrose 5 % 50 mL IVPB  Status:  Discontinued     2 g 100 mL/hr over 30 Minutes Intravenous Every 24 hours 02/15/14 0919 02/15/14 1115   02/13/14 1200  vancomycin (VANCOCIN) IVPB 750 mg/150 ml premix  Status:  Discontinued     750 mg 150 mL/hr over 60 Minutes Intravenous Every 12 hours 02/13/14 0127 02/15/14 0918   02/13/14 0130  vancomycin (VANCOCIN) IVPB 750 mg/150 ml premix     750 mg 150 mL/hr over 60 Minutes Intravenous  Once 02/13/14 0127 02/13/14 0258   02/12/14 2230  piperacillin-tazobactam (ZOSYN) IVPB 3.375 g  Status:  Discontinued     3.375 g 12.5 mL/hr over 240 Minutes Intravenous 3 times per day 02/12/14 2223 02/14/14 0922   02/12/14 2015  azithromycin (ZITHROMAX) 500 mg in dextrose 5 % 250 mL IVPB     500 mg 250 mL/hr over 60 Minutes Intravenous  Once 02/12/14 2000 02/12/14 2138   02/12/14 1945  cefTRIAXone (ROCEPHIN) 1 g in dextrose 5 % 50 mL IVPB     1 g 100 mL/hr over 30 Minutes Intravenous  Once 02/12/14 1941 02/12/14 2032      Social History:  reports that he has never smoked. He has never used smokeless tobacco. He reports that he drinks about 1.2 ounces of alcohol per week. He reports that he does not use illicit drugs.  History reviewed. No pertinent family history.  As in HPI and primary teams notes otherwise 12 point review of systems is negative  Blood pressure 144/86, pulse 63, temperature 98.4 F (36.9 C), temperature source Oral, resp. rate 18, height 6' 1" (1.854 m), weight 210 lb 4.8 oz (95.391 kg), SpO2 96.00%.   General: Alert and awake, oriented x3, not in any acute distress. HEENT: anicteric sclera, , EOMI, oropharynx clear and without exudate,  CVS regular rate, normal r,  no murmur rubs or gallops Chest: clear to auscultation bilaterally, no wheezing, rales or rhonchi Abdomen: soft nontender, nondistended, normal bowel sounds, Skin: no rashes Neuro: nonfocal, strength and sensation intact   Results for orders placed during the hospital encounter of 02/12/14 (from the past 48 hour(s))  LEGIONELLA ANTIGEN, URINE     Status: None   Collection Time    02/14/14  2:55 AM      Result   Value Ref Range   Specimen Description URINE, RANDOM     Special Requests NONE     Legionella Antigen, Urine       Value: Negative for Legionella pneumophilia serogroup 1     Performed at [facility]   Report Status 02/14/2014 FINAL    STREP PNEUMONIAE URINARY ANTIGEN     Status: None   Collection Time    02/14/14  2:55 AM      Result Value Ref Range   Strep Pneumo Urinary Antigen NEGATIVE  NEGATIVE   Comment:            Infection due to S. pneumoniae     cannot be absolutely ruled out     since the antigen present     may be below the detection limit     of the test.  CBC WITH DIFFERENTIAL     Status: Abnormal   Collection Time    02/14/14  4:57 AM      Result Value Ref Range   WBC 22.6 (*) 4.0 - 10.5 K/uL    RBC 3.87 (*) 4.22 - 5.81 MIL/uL   Hemoglobin 11.7 (*) 13.0 - 17.0 g/dL   HCT 33.2 (*) 39.0 - 52.0 %   MCV 85.8  78.0 - 100.0 fL   MCH 30.2  26.0 - 34.0 pg   MCHC 35.2  30.0 - 36.0 g/dL   RDW 14.3  11.5 - 15.5 %   Platelets 115 (*) 150 - 400 K/uL   Comment: PLATELET COUNT CONFIRMED BY SMEAR     REPEATED TO VERIFY   Neutrophils Relative % 91 (*) 43 - 77 %   Neutro Abs 20.6 (*) 1.7 - 7.7 K/uL   Lymphocytes Relative 3 (*) 12 - 46 %   Lymphs Abs 0.7  0.7 - 4.0 K/uL   Monocytes Relative 6  3 - 12 %   Monocytes Absolute 1.4 (*) 0.1 - 1.0 K/uL   Eosinophils Relative 0  0 - 5 %   Eosinophils Absolute 0.0  0.0 - 0.7 K/uL   Basophils Relative 0  0 - 1 %   Basophils Absolute 0.0  0.0 - 0.1 K/uL  BASIC METABOLIC PANEL     Status: Abnormal   Collection Time    02/14/14  4:57 AM      Result Value Ref Range   Sodium 140  137 - 147 mEq/L   Potassium 3.4 (*) 3.7 - 5.3 mEq/L   Chloride 107  96 - 112 mEq/L   CO2 22  19 - 32 mEq/L   Glucose, Bld 199 (*) 70 - 99 mg/dL   BUN 28 (*) 6 - 23 mg/dL   Creatinine, Ser 1.14  0.50 - 1.35 mg/dL   Calcium 7.9 (*) 8.4 - 10.5 mg/dL   GFR calc non Af Amer 66 (*) >90 mL/min   GFR calc Af Amer 77 (*) >90 mL/min   Comment: (NOTE)     The eGFR has been calculated using the CKD EPI equation.     This calculation has not been validated in all clinical situations.     eGFR's persistently <90 mL/min signify possible Chronic Kidney     Disease.  MAGNESIUM     Status: Abnormal   Collection Time    02/14/14  4:57 AM      Result Value Ref Range   Magnesium 2.7 (*) 1.5 - 2.5 mg/dL  PHOSPHORUS     Status: None   Collection Time      02/14/14  4:57 AM      Result Value Ref Range   Phosphorus 2.5  2.3 - 4.6 mg/dL  LACTIC ACID, PLASMA     Status: None   Collection Time    02/14/14  4:57 AM      Result Value Ref Range   Lactic Acid, Venous 1.2  0.5 - 2.2 mmol/L  CK TOTAL AND CKMB     Status: Abnormal   Collection Time    02/14/14  4:57 AM      Result Value Ref  Range   Total CK 182  7 - 232 U/L   CK, MB 4.2 (*) 0.3 - 4.0 ng/mL   Relative Index 2.3  0.0 - 2.5  PROCALCITONIN     Status: None   Collection Time    02/14/14  4:57 AM      Result Value Ref Range   Procalcitonin 91.52     Comment:            Interpretation:     PCT >= 10 ng/mL:     Important systemic inflammatory response,     almost exclusively due to severe bacterial     sepsis or septic shock.     (NOTE)             ICU PCT Algorithm               Non ICU PCT Algorithm        ----------------------------     ------------------------------             PCT < 0.25 ng/mL                 PCT < 0.1 ng/mL         Stopping of antibiotics            Stopping of antibiotics           strongly encouraged.               strongly encouraged.        ----------------------------     ------------------------------           PCT level decrease by               PCT < 0.25 ng/mL           >= 80% from peak PCT           OR PCT 0.25 - 0.5 ng/mL          Stopping of antibiotics                                                 encouraged.         Stopping of antibiotics               encouraged.        ----------------------------     ------------------------------           PCT level decrease by              PCT >= 0.25 ng/mL           < 80% from peak PCT            AND PCT >= 0.5 ng/mL            Continuing antibiotics                                                    encouraged.           Continuing antibiotics                encouraged.        ----------------------------     ------------------------------         PCT level increase compared          PCT > 0.5 ng/mL             with peak PCT AND              PCT >= 0.5 ng/mL             Escalation of antibiotics                                              strongly encouraged.          Escalation of antibiotics            strongly encouraged.  URINALYSIS, ROUTINE W REFLEX MICROSCOPIC     Status: Abnormal   Collection Time    02/14/14   8:48 PM      Result Value Ref Range   Color, Urine YELLOW  YELLOW   APPearance CLEAR  CLEAR   Specific Gravity, Urine 1.027  1.005 - 1.030   pH 6.0  5.0 - 8.0   Glucose, UA NEGATIVE  NEGATIVE mg/dL   Hgb urine dipstick TRACE (*) NEGATIVE   Bilirubin Urine NEGATIVE  NEGATIVE   Ketones, ur NEGATIVE  NEGATIVE mg/dL   Protein, ur NEGATIVE  NEGATIVE mg/dL   Urobilinogen, UA 0.2  0.0 - 1.0 mg/dL   Nitrite NEGATIVE  NEGATIVE   Leukocytes, UA NEGATIVE  NEGATIVE  URINE MICROSCOPIC-ADD ON     Status: None   Collection Time    02/14/14  8:48 PM      Result Value Ref Range   Squamous Epithelial / LPF RARE  RARE   WBC, UA 0-2  <3 WBC/hpf   RBC / HPF 0-2  <3 RBC/hpf  CBC WITH DIFFERENTIAL     Status: Abnormal   Collection Time    02/15/14  6:48 AM      Result Value Ref Range   WBC 6.2  4.0 - 10.5 K/uL   RBC 3.79 (*) 4.22 - 5.81 MIL/uL   Hemoglobin 11.4 (*) 13.0 - 17.0 g/dL   Comment: CONSISTENT WITH PREVIOUS RESULT   HCT 33.3 (*) 39.0 - 52.0 %   MCV 87.9  78.0 - 100.0 fL   MCH 30.1  26.0 - 34.0 pg   MCHC 34.2  30.0 - 36.0 g/dL   RDW 14.7  11.5 - 15.5 %   Platelets 105 (*) 150 - 400 K/uL   Comment: CONSISTENT WITH PREVIOUS RESULT   Neutrophils Relative % 63  43 - 77 %   Lymphocytes Relative 25  12 - 46 %   Monocytes Relative 12  3 - 12 %   Eosinophils Relative 0  0 - 5 %   Basophils Relative 0  0 - 1 %   Band Neutrophils 0  0 - 10 %   Metamyelocytes Relative 0     Myelocytes 0     Promyelocytes Absolute 0     Blasts 0     nRBC 0  0 /100 WBC   Neutro Abs 3.9  1.7 - 7.7 K/uL     Lymphs Abs 1.6  0.7 - 4.0 K/uL   Monocytes Absolute 0.7  0.1 - 1.0 K/uL   Eosinophils Absolute 0.0  0.0 - 0.7 K/uL   Basophils Absolute 0.0  0.0 - 0.1 K/uL  COMPREHENSIVE METABOLIC PANEL     Status: Abnormal   Collection Time    02/15/14  6:48 AM      Result Value Ref Range   Sodium 142  137 - 147 mEq/L   Potassium 3.4 (*) 3.7 - 5.3 mEq/L   Chloride 108  96 - 112 mEq/L   CO2 24  19 - 32 mEq/L    Glucose, Bld 106 (*) 70 - 99 mg/dL   BUN 26 (*) 6 - 23 mg/dL   Creatinine, Ser 1.03  0.50 - 1.35 mg/dL   Calcium 7.7 (*) 8.4 - 10.5 mg/dL   Total Protein 5.5 (*) 6.0 - 8.3 g/dL   Albumin 2.1 (*) 3.5 - 5.2 g/dL   AST 20  0 - 37 U/L   ALT 25  0 - 53 U/L   Alkaline Phosphatase 53  39 - 117 U/L   Total Bilirubin 0.3  0.3 - 1.2 mg/dL   GFR calc non Af Amer 75 (*) >90 mL/min   GFR calc Af Amer 87 (*) >90 mL/min   Comment: (NOTE)     The eGFR has been calculated using the CKD EPI equation.     This calculation has not been validated in all clinical situations.     eGFR's persistently <90 mL/min signify possible Chronic Kidney     Disease.      Component Value Date/Time   SDES URINE, RANDOM 02/14/2014 0255   SPECREQUEST NONE 02/14/2014 0255   CULT  Value:        BLOOD CULTURE RECEIVED NO GROWTH TO DATE CULTURE WILL BE HELD FOR 5 DAYS BEFORE ISSUING A FINAL NEGATIVE REPORT Performed at [facility] 02/13/2014 0852   REPTSTATUS 02/14/2014 FINAL 02/14/2014 0255   No results found.   Recent Results (from the past 720 hour(s))  URINE CULTURE     Status: None   Collection Time    02/12/14  7:21 PM      Result Value Ref Range Status   Specimen Description URINE, CLEAN CATCH   Final   Special Requests NONE   Final   Culture  Setup Time     Final   Value: 02/12/2014 19:58     Performed at [facility]   Colony Count     Final   Value: NO GROWTH     Performed at [facility]   Culture     Final   Value: NO GROWTH     Performed at [facility]   Report Status 02/14/2014 FINAL   Final  CULTURE, BLOOD (ROUTINE X 2)     Status: None   Collection Time    02/12/14  7:30 PM      Result Value Ref Range Status   Specimen Description BLOOD RIGHT FOREARM   Final   Special Requests BOTTLES DRAWN AEROBIC AND ANAEROBIC 6CCBLUE 5CCRED   Final   Culture  Setup Time     Final   Value: 02/13/2014 01:15     Performed at [facility]   Culture     Final   Value:  STREPTOCOCCUS PNEUMONIAE     Note: Gram Stain Report Called to,Read Back By and Verified With: ELIZABETH LANEY ON 02/13/2014 AT 8:32P BY WILEJ       Performed at [facility]   Report Status 02/15/2014 FINAL   Final   Organism ID, Bacteria STREPTOCOCCUS PNEUMONIAE   Final  CULTURE, BLOOD (ROUTINE X 2)     Status: None   Collection Time    02/12/14  7:45 PM      Result Value Ref Range Status   Specimen Description BLOOD HAND LEFT   Final   Special Requests BOTTLES DRAWN AEROBIC AND ANAEROBIC 5CC   Final   Culture  Setup Time     Final   Value: 02/13/2014 01:15     Performed at [facility]   Culture     Final   Value: STREPTOCOCCUS PNEUMONIAE     Note: SUSCEPTIBILITIES PERFORMED ON PREVIOUS CULTURE WITHIN THE LAST 5 DAYS.     Note: Gram Stain Report Called to,Read Back By and Verified With: ELIZABETH LANEY ON 02/13/2014 AT 8:32P BY WILEJ     Performed at [facility]   Report Status 02/15/2014 FINAL   Final  MRSA PCR SCREENING     Status: None   Collection Time    02/12/14 11:12 PM      Result Value Ref Range Status   MRSA by PCR NEGATIVE  NEGATIVE Final   Comment:            The GeneXpert MRSA Assay (FDA     approved for NASAL specimens     only), is one component of a     comprehensive MRSA colonization     surveillance program. It is not     intended to diagnose MRSA     infection nor to guide or     monitor treatment for     MRSA infections.  RESPIRATORY VIRUS PANEL     Status: None   Collection Time    02/13/14  2:03 AM      Result Value Ref Range Status   Source - RVPAN NASAL SWAB   Corrected   Comment: CORRECTED ON 04/29 AT 2038: PREVIOUSLY REPORTED AS NASAL SWAB   Respiratory Syncytial Virus A NOT DETECTED   Final   Respiratory Syncytial Virus B NOT DETECTED   Final   Influenza A NOT DETECTED   Final   Influenza B NOT DETECTED   Final   Parainfluenza 1 NOT DETECTED   Final   Parainfluenza 2 NOT DETECTED   Final   Parainfluenza 3 NOT DETECTED    Final   Metapneumovirus NOT DETECTED   Final   Rhinovirus NOT DETECTED   Final   Adenovirus NOT DETECTED   Final   Influenza A H1 NOT DETECTED   Final   Influenza A H3 NOT DETECTED   Final   Comment: (NOTE)           Normal Reference Range for each Analyte: NOT DETECTED     Testing performed using the Luminex xTAG Respiratory Viral Panel test     kit.     This test was developed and its performance characteristics determined     by [facility]. It has not been cleared or approved by the [place]     Food and Drug Administration. This test is used for clinical purposes.     It should not be regarded as investigational or for research. This     laboratory is certified under the Clinical Laboratory Improvement     Amendments of 1988 (CLIA) as qualified to perform high complexity     clinical laboratory testing.       Performed at [facility]  CULTURE, BLOOD (ROUTINE X 2)     Status: None   Collection Time    02/13/14  8:44 AM      Result Value Ref Range Status   Specimen Description BLOOD LEFT ANTECUBITAL   Final   Special Requests     Final   Value: BOTTLES DRAWN AEROBIC AND ANAEROBIC 10CC BLUE, 9CC RED   Culture  Setup Time     Final   Value: 02/13/2014 14:22     Performed at [facility]   Culture     Final   Value:        BLOOD CULTURE RECEIVED NO GROWTH TO DATE CULTURE WILL BE HELD FOR 5 DAYS BEFORE ISSUING A FINAL NEGATIVE REPORT     Performed at [facility]   Report Status PENDING   Incomplete  CULTURE, BLOOD (ROUTINE X 2)     Status: None   Collection Time    02/13/14  8:52 AM      Result Value Ref Range Status   Specimen Description BLOOD LEFT HAND   Final   Special Requests BOTTLES DRAWN AEROBIC AND ANAEROBIC 10CC   Final   Culture  Setup Time     Final   Value: 02/13/2014 14:22     Performed at [facility]   Culture     Final   Value:        BLOOD CULTURE RECEIVED NO GROWTH TO DATE CULTURE WILL BE HELD FOR 5 DAYS BEFORE ISSUING A  FINAL NEGATIVE REPORT     Performed at [facility]   Report Status PENDING   Incomplete     Impression/Recommendation  Principal Problem:   Septic shock Active Problems:   Unspecified hypothyroidism   Essential hypertension, benign   Leukocytopenia, unspecified   Gram-positive bacteremia   ARF (acute renal failure)   HTN (hypertension)   Jeffrey Guerrero is a 62 y.o. male with  Pneumococcal bacteremia and septic shock  #1 Pneumococcal Bacteremia and Septic shock:  Not clear what the source was  Not much point in doing LP at this point.  Would increase to Q 12 hours to maximiaze CNS penetration  Would ask Cardiology to evaluate heart valves with TEE  Patient needs HIV test  #2 Screening: check for HIV and hepatitis panel.  Dr. Hatcher taking over service tomorrow.    02/15/2014, 6:29 PM   Thank you so much for this interesting consult  [facility] [phone] (pager) [phone] (office) 02/15/2014, 6:29 PM  Jeffrey Guerrero 02/15/2014, 6:29 PM     

## 2014-02-16 NOTE — Progress Notes (Signed)
Addendum  Patient seen and examined, chart and data base reviewed.  I agree with the above assessment and plan.  For full details please see Mrs. Marianne York PA note.  This is an addendum to the note dictated earlier today by Mrs. Marianne York.  Preliminary TEE showed pulmonic valve vegetation and possible cavitary lesion.  Check CT of the chest with contrast, continue antibiotics, await the official TEE reading. Samirah Scarpati A Syniyah Bourne, MD  [facility]  Pager: [phone]  02/16/2014, 6:15 PM    

## 2014-02-16 NOTE — Progress Notes (Signed)
  Echocardiogram Echocardiogram Transesophageal has been performed.  Carney Corners 02/16/2014, 3:31 PM

## 2014-02-16 NOTE — Progress Notes (Signed)
ANTICOAGULATION CONSULT NOTE - Initial Consult  Pharmacy Consult for heparin Indication: pulmonary embolus  No Known Allergies  Patient Measurements: Height: 6\' 1"  (185.4 cm) Weight: 210 lb 15.7 oz (95.7 kg) IBW/kg (Calculated) : 79.9 Heparin Dosing Weight: 95.7 kg  Vital Signs: Temp: 98.8 F (37.1 C) (05/01 1533) Temp src: Oral (05/01 1533) BP: 160/94 mmHg (05/01 1544) Pulse Rate: 66 (05/01 1544)  Labs:  Recent Labs  02/14/14 0457 02/15/14 0648 02/16/14 0812  HGB 11.7* 11.4* 12.5*  HCT 33.2* 33.3* 36.0*  PLT 115* 105* 105*  CREATININE 1.14 1.03  --   CKTOTAL 182  --   --   CKMB 4.2*  --   --     Estimated Creatinine Clearance: 81.9 ml/min (by C-G formula based on Cr of 1.03).   Medical History: Past Medical History  Diagnosis Date  . Hypertension   . Thyroid disease   . Hypothyroidism   . Arthritis     "knees" (02/13/2014)    Medications:  Scheduled:  . cefTRIAXone (ROCEPHIN)  IV  2 g Intravenous Q12H  . levothyroxine  100 mcg Oral QAC breakfast  . [START ON 02/18/2014] Vitamin D (Ergocalciferol)  50,000 Units Oral Q7 days   Infusions:  . sodium chloride 75 mL (02/16/14 0234)    Assessment: 63 yo male with PE will be started on heparin therapy.  He was on SQ heparin for VTE prophylaxis; last dose was at 0516 on 02/16/14.  INR 1.31 and aPTT 35 on 02/12/14.  Hgb is 12.5 and Plt 105 K on 02/16/14 Goal of Therapy:  Heparin level 0.3-0.7 units/ml Monitor platelets by anticoagulation protocol: Yes   Plan:  1) Start heparin drip at 1350 units/hr.  2) 6hr heparin level after drip is started 3) Daily heparin level and CBC  Tsz-Yin Angila Wombles 02/16/2014,8:48 PM

## 2014-02-16 NOTE — Op Note (Signed)
Full report to follow in CV section of chart 

## 2014-02-16 NOTE — Progress Notes (Signed)
INFECTIOUS DISEASE PROGRESS NOTE  ID: Jeffrey Guerrero is a 63 y.o. male with  Principal Problem:   Septic shock Active Problems:   Unspecified hypothyroidism   Essential hypertension, benign   Leukocytopenia, unspecified   Gram-positive bacteremia   ARF (acute renal failure)   HTN (hypertension)  Subjective: Without complaints  Abtx:  Anti-infectives   Start     Dose/Rate Route Frequency Ordered Stop   02/15/14 2200  cefTRIAXone (ROCEPHIN) 2 g in dextrose 5 % 50 mL IVPB     2 g 100 mL/hr over 30 Minutes Intravenous Every 12 hours 02/15/14 1115     02/15/14 1000  cefTRIAXone (ROCEPHIN) 2 g in dextrose 5 % 50 mL IVPB  Status:  Discontinued     2 g 100 mL/hr over 30 Minutes Intravenous Every 24 hours 02/15/14 0919 02/15/14 1115   02/13/14 1200  vancomycin (VANCOCIN) IVPB 750 mg/150 ml premix  Status:  Discontinued     750 mg 150 mL/hr over 60 Minutes Intravenous Every 12 hours 02/13/14 0127 02/15/14 0918   02/13/14 0130  vancomycin (VANCOCIN) IVPB 750 mg/150 ml premix     750 mg 150 mL/hr over 60 Minutes Intravenous  Once 02/13/14 0127 02/13/14 0258   02/12/14 2230  piperacillin-tazobactam (ZOSYN) IVPB 3.375 g  Status:  Discontinued     3.375 g 12.5 mL/hr over 240 Minutes Intravenous 3 times per day 02/12/14 2223 02/14/14 0922   02/12/14 2015  azithromycin (ZITHROMAX) 500 mg in dextrose 5 % 250 mL IVPB     500 mg 250 mL/hr over 60 Minutes Intravenous  Once 02/12/14 2000 02/12/14 2138   02/12/14 1945  cefTRIAXone (ROCEPHIN) 1 g in dextrose 5 % 50 mL IVPB     1 g 100 mL/hr over 30 Minutes Intravenous  Once 02/12/14 1941 02/12/14 2032      Medications:  Scheduled: . cefTRIAXone (ROCEPHIN)  IV  2 g Intravenous Q12H  . heparin  5,000 Units Subcutaneous 3 times per day  . levothyroxine  100 mcg Oral QAC breakfast  . [START ON 02/18/2014] Vitamin D (Ergocalciferol)  50,000 Units Oral Q7 days    Objective: Vital signs in last 24 hours: Temp:  [98.4 F (36.9 C)-99 F (37.2  C)] 98.4 F (36.9 C) (05/01 0427) Pulse Rate:  [63-67] 66 (05/01 0427) Resp:  [18] 18 (05/01 0427) BP: (144-151)/(86-92) 147/90 mmHg (05/01 0427) SpO2:  [96 %-97 %] 96 % (05/01 0427) Weight:  [95.7 kg (210 lb 15.7 oz)] 95.7 kg (210 lb 15.7 oz) (05/01 0427)   General appearance: alert, cooperative and no distress Resp: clear to auscultation bilaterally Cardio: regular rate and rhythm GI: normal findings: bowel sounds normal and soft, non-tender  Lab Results  Recent Labs  02/14/14 0457 02/15/14 0648 02/16/14 0812  WBC 22.6* 6.2 6.6  HGB 11.7* 11.4* 12.5*  HCT 33.2* 33.3* 36.0*  NA 140 142  --   K 3.4* 3.4*  --   CL 107 108  --   CO2 22 24  --   BUN 28* 26*  --   CREATININE 1.14 1.03  --    Liver Panel  Recent Labs  02/15/14 0648  PROT 5.5*  ALBUMIN 2.1*  AST 20  ALT 25  ALKPHOS 53  BILITOT 0.3   Sedimentation Rate No results found for this basename: ESRSEDRATE,  in the last 72 hours C-Reactive Protein No results found for this basename: CRP,  in the last 72 hours  Microbiology: Recent Results (from the past 240  hour(s))  URINE CULTURE     Status: None   Collection Time    02/12/14  7:21 PM      Result Value Ref Range Status   Specimen Description URINE, CLEAN CATCH   Final   Special Requests NONE   Final   Culture  Setup Time     Final   Value: 02/12/2014 19:58     Performed at [place]     Final   Value: NO GROWTH     Performed at [facility]   Culture     Final   Value: NO GROWTH     Performed at [facility]   Report Status 02/14/2014 FINAL   Final  CULTURE, BLOOD (ROUTINE X 2)     Status: None   Collection Time    02/12/14  7:30 PM      Result Value Ref Range Status   Specimen Description BLOOD RIGHT FOREARM   Final   Special Requests BOTTLES DRAWN AEROBIC AND ANAEROBIC 6CCBLUE 5CCRED   Final   Culture  Setup Time     Final   Value: 02/13/2014 01:15     Performed at [facility]   Culture      Final   Value: STREPTOCOCCUS PNEUMONIAE     Note: Gram Stain Report Called to,Read Back By and Verified With: Mardene Celeste ON 02/13/2014 AT 8:32P BY WILEJ     Performed at [facility]   Report Status 02/15/2014 FINAL   Final   Organism ID, Bacteria STREPTOCOCCUS PNEUMONIAE   Final  CULTURE, BLOOD (ROUTINE X 2)     Status: None   Collection Time    02/12/14  7:45 PM      Result Value Ref Range Status   Specimen Description BLOOD HAND LEFT   Final   Special Requests BOTTLES DRAWN AEROBIC AND ANAEROBIC 5CC   Final   Culture  Setup Time     Final   Value: 02/13/2014 01:15     Performed at [facility]   Culture     Final   Value: STREPTOCOCCUS PNEUMONIAE     Note: SUSCEPTIBILITIES PERFORMED ON PREVIOUS CULTURE WITHIN THE LAST 5 DAYS.     Note: Gram Stain Report Called to,Read Back By and Verified With: Mardene Celeste ON 02/13/2014 AT 8:32P BY Dennard Nip     Performed at [facility]   Report Status 02/15/2014 FINAL   Final  MRSA PCR SCREENING     Status: None   Collection Time    02/12/14 11:12 PM      Result Value Ref Range Status   MRSA by PCR NEGATIVE  NEGATIVE Final   Comment:            The GeneXpert MRSA Assay (FDA     approved for NASAL specimens     only), is one component of a     comprehensive MRSA colonization     surveillance program. It is not     intended to diagnose MRSA     infection nor to guide or     monitor treatment for     MRSA infections.  RESPIRATORY VIRUS PANEL     Status: None   Collection Time    02/13/14  2:03 AM      Result Value Ref Range Status   Source - RVPAN NASAL SWAB   Corrected   Comment: CORRECTED ON 04/29 AT 2038: PREVIOUSLY REPORTED AS NASAL  SWAB   Respiratory Syncytial Virus A NOT DETECTED   Final   Respiratory Syncytial Virus B NOT DETECTED   Final   Influenza A NOT DETECTED   Final   Influenza B NOT DETECTED   Final   Parainfluenza 1 NOT DETECTED   Final   Parainfluenza 2 NOT DETECTED   Final    Parainfluenza 3 NOT DETECTED   Final   Metapneumovirus NOT DETECTED   Final   Rhinovirus NOT DETECTED   Final   Adenovirus NOT DETECTED   Final   Influenza A H1 NOT DETECTED   Final   Influenza A H3 NOT DETECTED   Final   Comment: (NOTE)           Normal Reference Range for each Analyte: NOT DETECTED     Testing performed using the Luminex xTAG Respiratory Viral Panel test     kit.     This test was developed and its performance characteristics determined     by [facility]. It has not been cleared or approved by the [place]     Food and Drug Administration. This test is used for clinical purposes.     It should not be regarded as investigational or for research. This     laboratory is certified under the [place] (CLIA) as qualified to perform high complexity     clinical laboratory testing.     Performed at [place], BLOOD (ROUTINE X 2)     Status: None   Collection Time    02/13/14  8:44 AM      Result Value Ref Range Status   Specimen Description BLOOD LEFT ANTECUBITAL   Final   Special Requests     Final   Value: BOTTLES DRAWN AEROBIC AND ANAEROBIC 10CC BLUE, [place] RED   Culture  Setup Time     Final   Value: 02/13/2014 14:22     Performed at [facility]   Culture     Final   Value:        BLOOD CULTURE RECEIVED NO GROWTH TO DATE CULTURE WILL BE HELD FOR 5 DAYS BEFORE ISSUING A FINAL NEGATIVE REPORT     Performed at [facility]   Report Status PENDING   Incomplete  CULTURE, BLOOD (ROUTINE X 2)     Status: None   Collection Time    02/13/14  8:52 AM      Result Value Ref Range Status   Specimen Description BLOOD LEFT HAND   Final   Special Requests BOTTLES DRAWN AEROBIC AND ANAEROBIC 10CC   Final   Culture  Setup Time     Final   Value: 02/13/2014 14:22     Performed at [facility]   Culture     Final   Value:        BLOOD CULTURE RECEIVED NO GROWTH TO DATE CULTURE WILL BE  HELD FOR 5 DAYS BEFORE ISSUING A FINAL NEGATIVE REPORT     Performed at [facility]   Report Status PENDING   Incomplete    Studies/Results: No results found.   Assessment/Plan: Pneumococcal Sepsis Mild HTN  TEE done, result pending.  HIV elisa (-) Repeat BCx are ngtd.   Total days of antibiotics: 6 (ceftriaxone) Would aim for 14 days anbx (at least 7 IV), then could change to augmentin at d/c.  [facility] (pager) [phone] [email or web address] 02/16/2014, 12:07 PM  LOS: 4 days   **Disclaimer: This note may have been dictated with voice recognition software. Similar sounding words can inadvertently be transcribed and this note may contain transcription errors which may not have been corrected upon publication of note.**

## 2014-02-16 NOTE — Interval H&P Note (Signed)
History and Physical Interval Note:  02/16/2014 3:00 PM  Jeffrey Guerrero  has presented today for surgery, with the diagnosis of endocarditis  The various methods of treatment have been discussed with the patient and family. After consideration of risks, benefits and other options for treatment, the patient has consented to  Procedure(s): TRANSESOPHAGEAL ECHOCARDIOGRAM (TEE) ([name]) as a surgical intervention .  The patient's history has been reviewed, patient examined, no change in status, stable for surgery.  I have reviewed the patient's chart and labs.  Questions were answered to the patient's satisfaction.     [name]

## 2014-02-17 DIAGNOSIS — I2699 Other pulmonary embolism without acute cor pulmonale: Secondary | ICD-10-CM | POA: Diagnosis present

## 2014-02-17 LAB — HEPARIN LEVEL (UNFRACTIONATED)
HEPARIN UNFRACTIONATED: 0.2 [IU]/mL — AB (ref 0.30–0.70)
Heparin Unfractionated: 0.21 IU/mL — ABNORMAL LOW (ref 0.30–0.70)
Heparin Unfractionated: 0.33 IU/mL (ref 0.30–0.70)

## 2014-02-17 LAB — CBC
HEMATOCRIT: 34.6 % — AB (ref 39.0–52.0)
HEMOGLOBIN: 11.8 g/dL — AB (ref 13.0–17.0)
MCH: 30 pg (ref 26.0–34.0)
MCHC: 34.1 g/dL (ref 30.0–36.0)
MCV: 88 fL (ref 78.0–100.0)
Platelets: 130 10*3/uL — ABNORMAL LOW (ref 150–400)
RBC: 3.93 MIL/uL — AB (ref 4.22–5.81)
RDW: 14.1 % (ref 11.5–15.5)
WBC: 8.8 10*3/uL (ref 4.0–10.5)

## 2014-02-17 LAB — COMPREHENSIVE METABOLIC PANEL
ALT: 129 U/L — AB (ref 0–53)
AST: 103 U/L — ABNORMAL HIGH (ref 0–37)
Albumin: 2.1 g/dL — ABNORMAL LOW (ref 3.5–5.2)
Alkaline Phosphatase: 68 U/L (ref 39–117)
BILIRUBIN TOTAL: 0.5 mg/dL (ref 0.3–1.2)
BUN: 13 mg/dL (ref 6–23)
CHLORIDE: 104 meq/L (ref 96–112)
CO2: 27 meq/L (ref 19–32)
Calcium: 8.2 mg/dL — ABNORMAL LOW (ref 8.4–10.5)
Creatinine, Ser: 1.01 mg/dL (ref 0.50–1.35)
GFR calc non Af Amer: 77 mL/min — ABNORMAL LOW (ref >90)
GFR, EST AFRICAN AMERICAN: 89 mL/min — AB (ref >90)
Glucose, Bld: 111 mg/dL — ABNORMAL HIGH (ref 70–99)
Potassium: 4.2 mEq/L (ref 3.7–5.3)
SODIUM: 143 meq/L (ref 137–147)
Total Protein: 5.9 g/dL — ABNORMAL LOW (ref 6.0–8.3)

## 2014-02-17 MED ORDER — HEPARIN BOLUS VIA INFUSION
1400.0000 [IU] | Freq: Once | INTRAVENOUS | Status: AC
  Administered 2014-02-17: 1400 [IU] via INTRAVENOUS
  Filled 2014-02-17: qty 1400

## 2014-02-17 MED ORDER — LEVOTHYROXINE SODIUM 125 MCG PO TABS
125.0000 ug | ORAL_TABLET | Freq: Every day | ORAL | Status: DC
  Administered 2014-02-18 – 2014-02-19 (×2): 125 ug via ORAL
  Filled 2014-02-17 (×3): qty 1

## 2014-02-17 NOTE — Progress Notes (Signed)
Addendum  Patient seen and examined, chart and data base reviewed.  I agree with the above assessment and plan.  For full details please see Mrs. Imogene Burn PA note.  This is an addendum to the note dictated earlier today by Mrs. Imogene Burn.  Preliminary TEE showed pulmonic valve vegetation and possible cavitary lesion.  Check CT of the chest with contrast, continue antibiotics, await the official TEE reading. Birdie Hopes, MD  [facility]  Pager: [phone]  02/16/2014, 6:15 PM

## 2014-02-17 NOTE — Progress Notes (Signed)
RN notified of PE findings in pt's CT - on call physician notified - heparin drip ordered.

## 2014-02-17 NOTE — Progress Notes (Signed)
[facility] PROGRESS NOTE   Corley Maffeo RFF:[id number] DOB: 02-Feb-1951 DOA: 02/12/2014 PCP: [name], MD  HPI 63y/o M PMH HTN, hypothyroidism, and knee arthritis, presents to the ED from his PCP with a single episode of syncope from a standing position at home 4/27 am. Denies any HA, N/V, aura, CP, SOB etc. Did report feeling tired. Small laceration between eyes due to fall. While being evaluated in the ED he became severely hypotensive, febrile and tachycardic. WBC 28k, meeting sepsis criteria. Intensive work up with labs and imaging unable to identify a source. Admitted for Sepsis and persistent hypotension post IVF and Levophed.  Subjective: Informed patient about CAT scan findings multiple clots and TEE findings as well. Continued to be asymptomatic.  Assessment/Plan:  Principal Problem:   Septic shock Active Problems:   Unspecified hypothyroidism   Essential hypertension, benign   Leukocytopenia, unspecified   Gram-positive bacteremia   ARF (acute renal failure)   HTN (hypertension)     Streptococcus bacteremia -4/27 2 of 2 blood cultures: G+ cocci pairs=Strep pneumoniae. -CXR, Head & Abd CT, 2D Cardiac Echo, Renal US all negative for acute processes. -Repeat blood cultures done on 4/28 showed no growth to date -Continue Rocephin, a midline placed for long-term antibiotics.  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 -TEE obtained and showed pulmonic valve vegetation. Also showed a mobile mass in the pulmonary artery. -Awaits ID recommendation.  Acute PE -CT with contrast of the chest was done because of findings of large mobile mass in the right pulmonary artery. -Showed multiple PEs, discussed with radiology, favor PE over septic emboli. -Had positive d-dimer is a negative Dopplers, on admission, patient already on heparin drip. -We'll likely switch to Eliquis or Xarelto on D/C.   Septic shock -Patient admitted initially for  syncope, which is likely secondary to sepsis and/or PE. -became severely hypotensive in the ED resulting in 3L+ NS IVF and short duration of Levophed to raise BP -Multiple testing to id source, resp panel pending & Legionella negative -Resolved: PCT and lactic acid down trending, WBC 6.2  -Rocephin & Azithromycin x1 in ED, Zosyn x 3 days-d/c 4/29, now continue on Vancomycin x3 days-d/c 4/30,  -Restart Rocephin  (4/30).  ID recommends 1 gm q 12 hours.    Syncope -Single episode, no warning feeling or prodrome, due to endocarditis and sepsis. -Head CT normal, LE Doppler-negative, D-dimer 3.42, CTA was not checked initially because NO chest pain or hypoxia. -small healing lac to face -No noted arrythmia or mental status changes, d/c telemetry     Acute Kidney Injury/ATN -Resolving, acute issue due to severe hypotensive crisis/sepsis, no reported history -BUN down trending 35 to 26, sCr initially 1.80, now 1.03 down trending,  -no hydronephrosis on Abd CT, no acute issue on Renal U/S -This is resolved.    Hypokalemia -continues to be low, 3.4 -Replete w/ KCl PO    Hypothyroidism -Chronic, elevated TSH at 7.810. -Increase Synthroid to 125. Check TSH in 4 weeks as outpatient.    HTN -Chronic -Controlled with Amlodipine, but held due to current hypotensive condition -restart upon resolution of sepsis   Code Status:FULL  Family Communication: Plan discussed with the patient. Daughter at bedside. Disposition Plan: Remains inpatient.  D/C to home when appropriate.  Will need HHRN for IV antibiotics.   Antibiotics: Anti-infectives   Start     Dose/Rate Route Frequency Ordered Stop   02/15/14 2200  cefTRIAXone (ROCEPHIN) 2 g in dextrose 5 % 50 mL IVPB  2 g 100 mL/hr over 30 Minutes Intravenous Every 12 hours 02/15/14 1115     02/15/14 1000  cefTRIAXone (ROCEPHIN) 2 g in dextrose 5 % 50 mL IVPB  Status:  Discontinued     2 g 100 mL/hr over 30 Minutes Intravenous Every 24 hours  02/15/14 0919 02/15/14 1115   02/13/14 1200  vancomycin (VANCOCIN) IVPB 750 mg/150 ml premix  Status:  Discontinued     750 mg 150 mL/hr over 60 Minutes Intravenous Every 12 hours 02/13/14 0127 02/15/14 0918   02/13/14 0130  vancomycin (VANCOCIN) IVPB 750 mg/150 ml premix     750 mg 150 mL/hr over 60 Minutes Intravenous  Once 02/13/14 0127 02/13/14 0258   02/12/14 2230  piperacillin-tazobactam (ZOSYN) IVPB 3.375 g  Status:  Discontinued     3.375 g 12.5 mL/hr over 240 Minutes Intravenous 3 times per day 02/12/14 2223 02/14/14 0922   02/12/14 2015  azithromycin (ZITHROMAX) 500 mg in dextrose 5 % 250 mL IVPB     500 mg 250 mL/hr over 60 Minutes Intravenous  Once 02/12/14 2000 02/12/14 2138   02/12/14 1945  cefTRIAXone (ROCEPHIN) 1 g in dextrose 5 % 50 mL IVPB     1 g 100 mL/hr over 30 Minutes Intravenous  Once 02/12/14 1941 02/12/14 2032      Objective: Filed Vitals:   02/17/14 0616  BP: 113/72  Pulse: 64  Temp: 99.6 F (37.6 C)  Resp: 20    Intake/Output Summary (Last 24 hours) at 02/17/14 1109 Last data filed at 02/17/14 0913  Gross per 24 hour  Intake   2026 ml  Output   2500 ml  Net   -474 ml   Filed Weights   02/15/14 0900 02/16/14 0427 02/17/14 0616  Weight: 95.391 kg (210 lb 4.8 oz) 95.7 kg (210 lb 15.7 oz) 93.6 kg (206 lb 5.6 oz)    Exam: General: Alert and awake, oriented x3, not in any acute distress. Daughter at bedside. HEENT: anicteric sclera, pupils reactive to light and accommodation, EOMI CVS: S1-S2 clear, no murmur rubs or gallops Chest: clear to auscultation bilaterally, no wheezing, rales or rhonchi Abdomen: soft nontender, nondistended, normal bowel sounds, no organomegaly Extremities: no cyanosis, clubbing or edema noted bilaterally Neuro: Cranial nerves II-XII intact, no focal neurological deficits  Data Reviewed: Basic Metabolic Panel:  Recent Labs Lab 02/12/14 1304 02/12/14 2212 02/13/14 0500 02/14/14 0457 02/15/14 0648 02/17/14 0825    NA 139  --  138 140 142 143  K 3.3*  --  3.5* 3.4* 3.4* 4.2  CL 97  --  105 107 108 104  CO2 25  --  $R'21 22 24 27  'pg$ GLUCOSE 155*  --  157* 199* 106* 111*  BUN 35*  --  32* 28* 26* 13  CREATININE 1.80*  --  1.39* 1.14 1.03 1.01  CALCIUM 9.2  --  7.3* 7.9* 7.7* 8.2*  MG  --  2.2 2.3 2.7*  --   --   PHOS  --  2.3 2.2* 2.5  --   --    Liver Function Tests:  Recent Labs Lab 02/12/14 2212 02/15/14 0648 02/17/14 0825  AST 35 20 103*  ALT 24 25 129*  ALKPHOS 66 53 68  BILITOT 0.5 0.3 0.5  PROT 6.3 5.5* 5.9*  ALBUMIN 2.4* 2.1* 2.1*    Recent Labs Lab 02/12/14 2212  LIPASE 26  AMYLASE 28   CBC:  Recent Labs Lab 02/12/14 1304 02/13/14 0500 02/14/14 0457 02/15/14 0648 02/16/14  0093 02/17/14 0825  WBC 28.2* 21.9* 22.6* 6.2 6.6 8.8  NEUTROABS 23.8*  --  20.6* 3.9 4.0  --   HGB 14.0 11.6* 11.7* 11.4* 12.5* 11.8*  HCT 39.3 33.4* 33.2* 33.3* 36.0* 34.6*  MCV 86.9 87.4 85.8 87.9 86.7 88.0  PLT 161 130* 115* 105* 105* 130*   Cardiac Enzymes:  Recent Labs Lab 02/12/14 2212 02/13/14 0412 02/13/14 0844 02/13/14 1245 02/14/14 0457  CKTOTAL 547*  --  493*  --  182  CKMB  --   --  4.4*  --  4.2*  TROPONINI <0.30 <0.30  --  <0.30  --    BNP (last 3 results)  Recent Labs  02/12/14 2212  PROBNP 1062.0*   CBG:  Recent Labs Lab 02/12/14 2313  GLUCAP 162*    Micro Recent Results (from the past 240 hour(s))  URINE CULTURE     Status: None   Collection Time    02/12/14  7:21 PM      Result Value Ref Range Status   Specimen Description URINE, CLEAN CATCH   Final   Special Requests NONE   Final   Culture  Setup Time     Final   Value: 02/12/2014 19:58     Performed at [place]     Final   Value: NO GROWTH     Performed at [facility]   Culture     Final   Value: NO GROWTH     Performed at [facility]   Report Status 02/14/2014 FINAL   Final  CULTURE, BLOOD (ROUTINE X 2)     Status: None   Collection Time     02/12/14  7:30 PM      Result Value Ref Range Status   Specimen Description BLOOD RIGHT FOREARM   Final   Special Requests BOTTLES DRAWN AEROBIC AND ANAEROBIC 6CCBLUE 5CCRED   Final   Culture  Setup Time     Final   Value: 02/13/2014 01:15     Performed at [facility]   Culture     Final   Value: STREPTOCOCCUS PNEUMONIAE     Note: Gram Stain Report Called to,Read Back By and Verified With: Mardene Celeste ON 02/13/2014 AT 8:32P BY WILEJ     Performed at [facility]   Report Status 02/15/2014 FINAL   Final   Organism ID, Bacteria STREPTOCOCCUS PNEUMONIAE   Final  CULTURE, BLOOD (ROUTINE X 2)     Status: None   Collection Time    02/12/14  7:45 PM      Result Value Ref Range Status   Specimen Description BLOOD HAND LEFT   Final   Special Requests BOTTLES DRAWN AEROBIC AND ANAEROBIC 5CC   Final   Culture  Setup Time     Final   Value: 02/13/2014 01:15     Performed at [facility]   Culture     Final   Value: STREPTOCOCCUS PNEUMONIAE     Note: SUSCEPTIBILITIES PERFORMED ON PREVIOUS CULTURE WITHIN THE LAST 5 DAYS.     Note: Gram Stain Report Called to,Read Back By and Verified With: Mardene Celeste ON 02/13/2014 AT 8:32P BY Dennard Nip     Performed at [facility]   Report Status 02/15/2014 FINAL   Final  MRSA PCR SCREENING     Status: None   Collection Time    02/12/14 11:12 PM      Result Value Ref Range Status  MRSA by PCR NEGATIVE  NEGATIVE Final   Comment:            The GeneXpert MRSA Assay (FDA     approved for NASAL specimens     only), is one component of a     comprehensive MRSA colonization     surveillance program. It is not     intended to diagnose MRSA     infection nor to guide or     monitor treatment for     MRSA infections.  RESPIRATORY VIRUS PANEL     Status: None   Collection Time    02/13/14  2:03 AM      Result Value Ref Range Status   Source - RVPAN NASAL SWAB   Corrected   Comment: CORRECTED ON 04/29 AT 2038: PREVIOUSLY  REPORTED AS NASAL SWAB   Respiratory Syncytial Virus A NOT DETECTED   Final   Respiratory Syncytial Virus B NOT DETECTED   Final   Influenza A NOT DETECTED   Final   Influenza B NOT DETECTED   Final   Parainfluenza 1 NOT DETECTED   Final   Parainfluenza 2 NOT DETECTED   Final   Parainfluenza 3 NOT DETECTED   Final   Metapneumovirus NOT DETECTED   Final   Rhinovirus NOT DETECTED   Final   Adenovirus NOT DETECTED   Final   Influenza A H1 NOT DETECTED   Final   Influenza A H3 NOT DETECTED   Final   Comment: (NOTE)           Normal Reference Range for each Analyte: NOT DETECTED     Testing performed using the Luminex xTAG Respiratory Viral Panel test     kit.     This test was developed and its performance characteristics determined     by [facility]. It has not been cleared or approved by the [place]     Food and Drug Administration. This test is used for clinical purposes.     It should not be regarded as investigational or for research. This     laboratory is certified under the [place] (CLIA) as qualified to perform high complexity     clinical laboratory testing.     Performed at [place], BLOOD (ROUTINE X 2)     Status: None   Collection Time    02/13/14  8:44 AM      Result Value Ref Range Status   Specimen Description BLOOD LEFT ANTECUBITAL   Final   Special Requests     Final   Value: BOTTLES DRAWN AEROBIC AND ANAEROBIC 10CC BLUE, [place] RED   Culture  Setup Time     Final   Value: 02/13/2014 14:22     Performed at [facility]   Culture     Final   Value:        BLOOD CULTURE RECEIVED NO GROWTH TO DATE CULTURE WILL BE HELD FOR 5 DAYS BEFORE ISSUING A FINAL NEGATIVE REPORT     Performed at [facility]   Report Status PENDING   Incomplete  CULTURE, BLOOD (ROUTINE X 2)     Status: None   Collection Time    02/13/14  8:52 AM      Result Value Ref Range Status   Specimen Description  BLOOD LEFT HAND   Final   Special Requests BOTTLES DRAWN AEROBIC AND ANAEROBIC 10CC  Final   Culture  Setup Time     Final   Value: 02/13/2014 14:22     Performed at [facility]   Culture     Final   Value:        BLOOD CULTURE RECEIVED NO GROWTH TO DATE CULTURE WILL BE HELD FOR 5 DAYS BEFORE ISSUING A FINAL NEGATIVE REPORT     Performed at [facility]   Report Status PENDING   Incomplete     Studies: Ct Chest W Contrast  02/16/2014   CLINICAL DATA:  Large, mobile, possibly cavitary mass seen in the right pulmonary artery on transesophageal echocardiography earlier today. CTA of the lungs was recommended.  EXAM: CT CHEST WITH CONTRAST  TECHNIQUE: Multidetector CT imaging of the chest was performed during intravenous contrast administration.  CONTRAST:  27mL OMNIPAQUE IOHEXOL 300 MG/ML  SOLN  COMPARISON:  Report of the transesophageal echocardiogram obtained earlier today. Chest radiographs dated 02/12/2014.  FINDINGS: A routine chest CT with contrast was requested and performed. Therefore, the evaluation of the pulmonary arteries is not as optimal as would have been had a CTA been done.  There is a curvilinear filling defect in the main pulmonary artery. There are also filling defects in the right main pulmonary artery and its branches.  Small bilateral pleural effusions. Mild bilateral lower lobe atelectasis, greater on the left. No lung nodules or enlarged lymph nodes. Normal-sized heart. No lung nodules or enlarged lymph nodes. 4.9 x 4.3 x 3.2 cm oval, fluid filled mass in the anterior mediastinum, anterior to the ascending thoracic aorta. This measures 9 Hounsfield units in density. There is also a small amount of pericardial fluid posterior and lateral to the ascending thoracic aorta. This appears separate from the anterior mediastinal collection of fluid.  Thoracic spine degenerative changes.  Unremarkable upper abdomen.  IMPRESSION: 1. Multiple pulmonary emboli. 2. Small  bilateral pleural effusions. 3. Mild bilateral lower lobe atelectasis. 4. Small amount of superiorly located pericardial fluid. 5. Larger amount of anteriorly loculated pericardial fluid, superior pericardial cyst or thymic cyst. Critical Value/emergent results were called by telephone at the time of interpretation on 02/16/2014 at 8:03 PM to [place], the patient's nurse, who verbally acknowledged these results. She stated that she would inform the physician on call tonight.   Electronically Signed   By: Enrique Sack M.D.   On: 02/16/2014 20:07   2D Echocardiogram without contrast  02/13/14 Study Conclusions- Left ventricle: The cavity size was mildly dilated. Wall thickness was normal. Systolic function was mildly reduced. The estimated ejection fraction was in the range of 45% to 50%. Diffuse hypokinesis. - Right ventricle: The cavity size was moderately dilated. - Right atrium: The atrium was moderately dilated. - Atrial septum: No defect or patent foramen ovale was identified. Aortic valve: Mildly thickened leaflets. Doppler: There was no stenosis. Mitral valve: Mildly thickened leaflets . Doppler: No significant regurgitation. Pulmonic valve: Structurally normal valve. Cusp separation was normal. Doppler: Transvalvular velocity was within the normal range. Trivial regurgitation. Tricuspid valve: Structurally normal valve. Leaflet separation was normal. Doppler: Transvalvular velocity was within the normal range. Mild regurgitation. Transthoracic echocardiography. M-mode, complete 2D, spectral Doppler, and color Doppler.   Prepared and Electronically Authenticated by Jenkins [place] 2015-04-28T15:31:[id number]    Scheduled Meds: . cefTRIAXone (ROCEPHIN)  IV  2 g Intravenous Q12H  . levothyroxine  100 mcg Oral QAC breakfast  . [START ON 02/18/2014] Vitamin D (Ergocalciferol)  50,000 Units Oral Q7 days   Continuous Infusions: . sodium  chloride 75 mL/hr at 02/16/14 2346  . heparin 1,550 Units/hr (02/17/14 1038)         Time spent: 35 minutes  [place], PA-C. [facility] Pager [phone] If 7PM-7AM, please contact night-coverage at [email or web address], password [facility] 02/17/2014, 11:09 AM  LOS: 5 days

## 2014-02-17 NOTE — Progress Notes (Addendum)
ANTICOAGULATION CONSULT NOTE - Follow Up Consult  Pharmacy Consult for heparin Indication: pulmonary embolus  No Known Allergies  Patient Measurements: Height: 6\' 1"  (185.4 cm) Weight: 206 lb 5.6 oz (93.6 kg) IBW/kg (Calculated) : 79.9 Heparin Dosing Weight: 93.6 kg  Vital Signs: Temp: 99.6 F (37.6 C) (05/02 0616) Temp src: Oral (05/02 0616) BP: 113/72 mmHg (05/02 0616) Pulse Rate: 64 (05/02 0616)  Labs:  Recent Labs  02/15/14 0648 02/16/14 0812 02/17/14 0825  HGB 11.4* 12.5* 11.8*  HCT 33.3* 36.0* 34.6*  PLT 105* 105* 130*  HEPARINUNFRC  --   --  0.20*  CREATININE 1.03  --  1.01    Estimated Creatinine Clearance: 83.5 ml/min (by C-G formula based on Cr of 1.01).   Medications:  Infusions:  . sodium chloride 75 mL/hr at 02/16/14 2346  . heparin 1,350 Units/hr (02/16/14 2336)    Assessment: 63 y/o male on IV heparin for acute PE. Heparin level is subtherapeutic this morning at 0.2. No bleeding noted, Hb 11.8 - low stable, pltc 130 - trending down slowly so will watch.  Checked with RN and no problems with infusion. Heparin is running through midline PICC.  Goal of Therapy:  Heparin level 0.3-0.7 units/ml Monitor platelets by anticoagulation protocol: Yes   Plan:  - Heparin 1400 units IV bolus then increase rate to 1550 units/hr - 6 hr heparin level - Daily heparin level and CBC - Monitor for s/sx of bleeding  [facility], [place].D., BCPS Clinical Pharmacist Pager: [phone] 02/17/2014 9:30 AM   Addendum: Heparin level remains subtherapeutic at 0.21 despite rate increase this morning. After checking with RN though, heparin had been off from 12:10 until 13:38 because the bag ran dry. Suspect it would have still been low. No bleeding noted.  Heparin 1400 units IV bolus Increase heparin drip to 1750 units/hr and check 6 hr heparin level.  [place], Pharm.D., BCPS Clinical Pharmacist Pager: [phone] 02/17/2014 4:51 PM

## 2014-02-18 DIAGNOSIS — I2699 Other pulmonary embolism without acute cor pulmonale: Secondary | ICD-10-CM

## 2014-02-18 LAB — CBC
HCT: 34.8 % — ABNORMAL LOW (ref 39.0–52.0)
Hemoglobin: 11.8 g/dL — ABNORMAL LOW (ref 13.0–17.0)
MCH: 29.8 pg (ref 26.0–34.0)
MCHC: 33.9 g/dL (ref 30.0–36.0)
MCV: 87.9 fL (ref 78.0–100.0)
PLATELETS: 152 10*3/uL (ref 150–400)
RBC: 3.96 MIL/uL — AB (ref 4.22–5.81)
RDW: 14.2 % (ref 11.5–15.5)
WBC: 9.7 10*3/uL (ref 4.0–10.5)

## 2014-02-18 LAB — HEPARIN LEVEL (UNFRACTIONATED): Heparin Unfractionated: 0.38 IU/mL (ref 0.30–0.70)

## 2014-02-18 MED ORDER — APIXABAN 5 MG PO TABS: 5.0000 mg | ORAL_TABLET | Freq: Two times a day (BID) | ORAL | Status: DC

## 2014-02-18 MED ORDER — APIXABAN 5 MG PO TABS
10.0000 mg | ORAL_TABLET | Freq: Two times a day (BID) | ORAL | Status: DC
  Administered 2014-02-18 – 2014-02-19 (×3): 10 mg via ORAL
  Filled 2014-02-18 (×4): qty 2

## 2014-02-18 NOTE — Progress Notes (Signed)
Pt ambulated in hallway about 30ft.  Pt denied any dizziness or SOB.  Still feels a little weak.

## 2014-02-18 NOTE — Progress Notes (Signed)
ANTICOAGULATION CONSULT NOTE - Follow Up Consult  Pharmacy Consult for Heparin  Indication: pulmonary embolus  No Known Allergies  Patient Measurements: Height: 6\' 1"  (185.4 cm) Weight: 206 lb 5.6 oz (93.6 kg) IBW/kg (Calculated) : 79.9  Vital Signs: Temp: 100.8 F (38.2 C) (05/02 2135) Temp src: Oral (05/02 2135) BP: 150/82 mmHg (05/02 2135) Pulse Rate: 74 (05/02 2135)  Labs:  Recent Labs  02/15/14 0648 02/16/14 0812 02/17/14 0825 02/17/14 1600 02/17/14 2331  HGB 11.4* 12.5* 11.8*  --   --   HCT 33.3* 36.0* 34.6*  --   --   PLT 105* 105* 130*  --   --   HEPARINUNFRC  --   --  0.20* 0.21* 0.33  CREATININE 1.03  --  1.01  --   --     Estimated Creatinine Clearance: 83.5 ml/min (by C-G formula based on Cr of 1.01).   Medications:  Heparin 1750 units/hr  Assessment: 63 y/o M on heparin for PE. HL is 0.33 after rate increase. Other labs as above.   Goal of Therapy:  Heparin level 0.3-0.7 units/ml Monitor platelets by anticoagulation protocol: Yes   Plan:  -Continue heparin at 1750 units/hr -AM HL -Daily CBC/HL -Monitor for bleeding  Narda Bonds 02/18/2014,12:04 AM

## 2014-02-18 NOTE — Progress Notes (Signed)
ANTICOAGULATION CONSULT NOTE - Follow Up Consult  Pharmacy Consult for Heparin  Indication: pulmonary embolus  No Known Allergies  Patient Measurements: Height: 6\' 1"  (185.4 cm) Weight: 204 lb 2.3 oz (92.6 kg) IBW/kg (Calculated) : 79.9  Vital Signs: Temp: 98.5 F (36.9 C) (05/03 0531) Temp src: Oral (05/03 0531) BP: 128/74 mmHg (05/03 0531) Pulse Rate: 66 (05/03 0531)  Labs:  Recent Labs  02/16/14 0812  02/17/14 0825 02/17/14 1600 02/17/14 2331 02/18/14 0715  HGB 12.5*  --  11.8*  --   --  11.8*  HCT 36.0*  --  34.6*  --   --  34.8*  PLT 105*  --  130*  --   --  152  HEPARINUNFRC  --   < > 0.20* 0.21* 0.33 0.38  CREATININE  --   --  1.01  --   --   --   < > = values in this interval not displayed.  Estimated Creatinine Clearance: 83.5 ml/min (by C-G formula based on Cr of 1.01).     Assessment: 63 y/o M on heparin for PE and to transition to [place]. Wt= 92.6, SCr= 0.38 and CrCl ~ 80. Noted ALT/AST= 103/129 on 5/2 and up from 02/15/14. Child=Pugh score= 7 (Class B) and moderate impairment.  -No dose adjustments are recommended for moderate hepatic impairment  Goal of Therapy:  Heparin level 0.3-0.7 units/ml Monitor platelets by anticoagulation protocol: Yes   Plan:  -Apixiban 10mg  po bid for 7 days then on 02/25/14 begin 5mg  po bid  -Discontinue heparin  -Will provide patient education -Consider following LFT trend  Hildred Laser, Pharm D 02/18/2014 11:19 AM

## 2014-02-18 NOTE — Progress Notes (Addendum)
[facility] PROGRESS NOTE   Jeffrey Guerrero YKZ:[id number] DOB: [birth year] DOA: 02/12/2014 PCP: [name], MD  HPI 63y/o M PMH HTN, hypothyroidism, and knee arthritis, presents to the ED from his PCP with a single episode of syncope from a standing position at home 4/27 am. Denies any HA, N/V, aura, CP, SOB etc. Did report feeling tired. Small laceration between eyes due to fall. While being evaluated in the ED he became severely hypotensive, febrile and tachycardic. WBC 28k, meeting sepsis criteria. Intensive work up with labs and imaging unable to identify a source. Admitted for Sepsis and persistent hypotension post IVF and Levophed.  Subjective: Have had fever of 101.2 last night about 6 PM, repeat blood culture and await ID recommendation. Spoke with his wife over the phone, she is in [place].  Assessment/Plan:  Principal Problem:   Septic shock Active Problems:   Unspecified hypothyroidism   Essential hypertension, benign   Leukocytopenia, unspecified   Gram-positive bacteremia   ARF (acute renal failure)   HTN (hypertension)   Acute pulmonary embolism     Streptococcus bacteremia - 2 out of 2 blood cultures from 4/27 showed streptococcus pneumoniae. -CXR, Head & Abd CT, 2D Cardiac Echo, Renal US all negative for acute processes. -Repeat blood cultures done on 4/28 showed no growth to date -Continue Rocephin, a midline placed for long-term antibiotics.  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 -TEE obtained and showed pulmonic valve vegetation. Also showed a mobile mass in the pulmonary artery. -Awaits ID recommendation.  Acute PE -CT with contrast of the chest was done because of findings of large mobile mass in the right pulmonary artery. -Showed multiple PEs, discussed with radiology, favor PE over septic emboli. -Had positive d-dimer is a negative Dopplers, on admission, patient already on heparin drip. -I will switch to  Eliquis today. Stop heparin.   Septic shock -Patient admitted initially for syncope, which is likely secondary to sepsis and/or PE. -became severely hypotensive in the ED resulting in 3L+ NS IVF and short duration of Levophed to raise BP -Multiple testing to id source, resp panel pending & Legionella negative -Resolved: PCT and lactic acid down trending, WBC 6.2  -Rocephin & Azithromycin x1 in ED, Zosyn x 3 days-d/c 4/29, now continue on Vancomycin x3 days-d/c 4/30,  -Restart Rocephin  (4/30).  ID recommends 1 gm q 12 hours.    Syncope -Single episode, no warning feeling or prodrome, due to endocarditis and sepsis. -Head CT normal, LE Doppler-negative, D-dimer 3.42, CTA was not checked initially because NO chest pain or hypoxia. -small healing lac to face -No noted arrythmia or mental status changes, d/c telemetry     Acute Kidney Injury/ATN -Resolving, acute issue due to severe hypotensive crisis/sepsis, no reported history -BUN down trending 35 to 26, sCr initially 1.80, now 1.03 down trending,  -no hydronephrosis on Abd CT, no acute issue on Renal U/S -This is resolved.    Hypokalemia -continues to be low, 3.4 -Replete w/ KCl PO    Hypothyroidism -Chronic, elevated TSH at 7.810. -Increase Synthroid to 125. Check TSH in 4 weeks as outpatient.    HTN -Chronic -Controlled with Amlodipine, but held due to current hypotensive condition -restart upon resolution of sepsis   Code Status:FULL  Family Communication: Plan discussed with the patient. Daughter at bedside. Disposition Plan: Remains inpatient.  D/C to home when appropriate.  Will need HHRN for IV antibiotics.   Antibiotics: Anti-infectives   Start     Dose/Rate Route Frequency Ordered Stop  02/15/14 2200  cefTRIAXone (ROCEPHIN) 2 g in dextrose 5 % 50 mL IVPB     2 g 100 mL/hr over 30 Minutes Intravenous Every 12 hours 02/15/14 1115     02/15/14 1000  cefTRIAXone (ROCEPHIN) 2 g in dextrose 5 % 50 mL IVPB  Status:   Discontinued     2 g 100 mL/hr over 30 Minutes Intravenous Every 24 hours 02/15/14 0919 02/15/14 1115   02/13/14 1200  vancomycin (VANCOCIN) IVPB 750 mg/150 ml premix  Status:  Discontinued     750 mg 150 mL/hr over 60 Minutes Intravenous Every 12 hours 02/13/14 0127 02/15/14 0918   02/13/14 0130  vancomycin (VANCOCIN) IVPB 750 mg/150 ml premix     750 mg 150 mL/hr over 60 Minutes Intravenous  Once 02/13/14 0127 02/13/14 0258   02/12/14 2230  piperacillin-tazobactam (ZOSYN) IVPB 3.375 g  Status:  Discontinued     3.375 g 12.5 mL/hr over 240 Minutes Intravenous 3 times per day 02/12/14 2223 02/14/14 0922   02/12/14 2015  azithromycin (ZITHROMAX) 500 mg in dextrose 5 % 250 mL IVPB     500 mg 250 mL/hr over 60 Minutes Intravenous  Once 02/12/14 2000 02/12/14 2138   02/12/14 1945  cefTRIAXone (ROCEPHIN) 1 g in dextrose 5 % 50 mL IVPB     1 g 100 mL/hr over 30 Minutes Intravenous  Once 02/12/14 1941 02/12/14 2032      Objective: Filed Vitals:   02/18/14 0531  BP: 128/74  Pulse: 66  Temp: 98.5 F (36.9 C)  Resp: 20    Intake/Output Summary (Last 24 hours) at 02/18/14 1011 Last data filed at 02/18/14 0445  Gross per 24 hour  Intake 1852.98 ml  Output   2150 ml  Net -297.02 ml   Filed Weights   02/17/14 0616 02/17/14 2135 02/18/14 0541  Weight: 93.6 kg (206 lb 5.6 oz) 92.5 kg (203 lb 14.8 oz) 92.6 kg (204 lb 2.3 oz)    Exam: General: Alert and awake, oriented x3, not in any acute distress. Daughter at bedside. HEENT: anicteric sclera, pupils reactive to light and accommodation, EOMI CVS: S1-S2 clear, no murmur rubs or gallops Chest: clear to auscultation bilaterally, no wheezing, rales or rhonchi Abdomen: soft nontender, nondistended, normal bowel sounds, no organomegaly Extremities: no cyanosis, clubbing or edema noted bilaterally Neuro: Cranial nerves II-XII intact, no focal neurological deficits  Data Reviewed: Basic Metabolic Panel:  Recent Labs Lab 02/12/14 1304  02/12/14 2212 02/13/14 0500 02/14/14 0457 02/15/14 0648 02/17/14 0825  NA 139  --  138 140 142 143  K 3.3*  --  3.5* 3.4* 3.4* 4.2  CL 97  --  105 107 108 104  CO2 25  --  _0 GLUCOSE 155*  --  157* 199* 106* 111*  BUN 35*  --  32* 28* 26* 13  CREATININE 1.80*  --  1.39* 1.14 1.03 1.01  CALCIUM 9.2  --  7.3* 7.9* 7.7* 8.2*  MG  --  2.2 2.3 2.7*  --   --   PHOS  --  2.3 2.2* 2.5  --   --    Liver Function Tests:  Recent Labs Lab 02/12/14 2212 02/15/14 0648 02/17/14 0825  AST 35 20 103*  ALT 24 25 129*  ALKPHOS 66 53 68  BILITOT 0.5 0.3 0.5  PROT 6.3 5.5* 5.9*  ALBUMIN 2.4* 2.1* 2.1*    Recent Labs Lab 02/12/14 2212  LIPASE 26  AMYLASE 28   CBC:  Recent Labs Lab 02/12/14 1304  02/14/14 0457 02/15/14 0648 02/16/14 0812 02/17/14 0825 02/18/14 0715  WBC 28.2*  < > 22.6* 6.2 6.6 8.8 9.7  NEUTROABS 23.8*  --  20.6* 3.9 4.0  --   --   HGB 14.0  < > 11.7* 11.4* 12.5* 11.8* 11.8*  HCT 39.3  < > 33.2* 33.3* 36.0* 34.6* 34.8*  MCV 86.9  < > 85.8 87.9 86.7 88.0 87.9  PLT 161  < > 115* 105* 105* 130* 152  < > = values in this interval not displayed. Cardiac Enzymes:  Recent Labs Lab 02/12/14 2212 02/13/14 0412 02/13/14 0844 02/13/14 1245 02/14/14 0457  CKTOTAL 547*  --  493*  --  182  CKMB  --   --  4.4*  --  4.2*  TROPONINI <0.30 <0.30  --  <0.30  --    BNP (last 3 results)  Recent Labs  02/12/14 2212  PROBNP 1062.0*   CBG:  Recent Labs Lab 02/12/14 2313  GLUCAP 162*    Micro Recent Results (from the past 240 hour(s))  URINE CULTURE     Status: None   Collection Time    02/12/14  7:21 PM      Result Value Ref Range Status   Specimen Description URINE, CLEAN CATCH   Final   Special Requests NONE   Final   Culture  Setup Time     Final   Value: 02/12/2014 19:58     Performed at [facility]     Final   Value: NO GROWTH     Performed at [facility]   Culture     Final   Value: NO GROWTH      Performed at [facility]   Report Status 02/14/2014 FINAL   Final  CULTURE, BLOOD (ROUTINE X 2)     Status: None   Collection Time    02/12/14  7:30 PM      Result Value Ref Range Status   Specimen Description BLOOD RIGHT FOREARM   Final   Special Requests BOTTLES DRAWN AEROBIC AND ANAEROBIC 6CCBLUE 5CCRED   Final   Culture  Setup Time     Final   Value: 02/13/2014 01:15     Performed at [facility]   Culture     Final   Value: STREPTOCOCCUS PNEUMONIAE     Note: Gram Stain Report Called to,Read Back By and Verified With: Mardene Celeste ON 02/13/2014 AT 8:32P BY WILEJ     Performed at [facility]   Report Status 02/15/2014 FINAL   Final   Organism ID, Bacteria STREPTOCOCCUS PNEUMONIAE   Final  CULTURE, BLOOD (ROUTINE X 2)     Status: None   Collection Time    02/12/14  7:45 PM      Result Value Ref Range Status   Specimen Description BLOOD HAND LEFT   Final   Special Requests BOTTLES DRAWN AEROBIC AND ANAEROBIC 5CC   Final   Culture  Setup Time     Final   Value: 02/13/2014 01:15     Performed at [facility]   Culture     Final   Value: STREPTOCOCCUS PNEUMONIAE     Note: SUSCEPTIBILITIES PERFORMED ON PREVIOUS CULTURE WITHIN THE LAST 5 DAYS.     Note: Gram Stain Report Called to,Read Back By and Verified With: Mardene Celeste ON 02/13/2014 AT 8:32P BY Dennard Nip     Performed at [facility]  Report Status 02/15/2014 FINAL   Final  MRSA PCR SCREENING     Status: None   Collection Time    02/12/14 11:12 PM      Result Value Ref Range Status   MRSA by PCR NEGATIVE  NEGATIVE Final   Comment:            The GeneXpert MRSA Assay (FDA     approved for NASAL specimens     only), is one component of a     comprehensive MRSA colonization     surveillance program. It is not     intended to diagnose MRSA     infection nor to guide or     monitor treatment for     MRSA infections.  RESPIRATORY VIRUS PANEL     Status: None   Collection Time     02/13/14  2:03 AM      Result Value Ref Range Status   Source - RVPAN NASAL SWAB   Corrected   Comment: CORRECTED ON 04/29 AT 2038: PREVIOUSLY REPORTED AS NASAL SWAB   Respiratory Syncytial Virus A NOT DETECTED   Final   Respiratory Syncytial Virus B NOT DETECTED   Final   Influenza A NOT DETECTED   Final   Influenza B NOT DETECTED   Final   Parainfluenza 1 NOT DETECTED   Final   Parainfluenza 2 NOT DETECTED   Final   Parainfluenza 3 NOT DETECTED   Final   Metapneumovirus NOT DETECTED   Final   Rhinovirus NOT DETECTED   Final   Adenovirus NOT DETECTED   Final   Influenza A H1 NOT DETECTED   Final   Influenza A H3 NOT DETECTED   Final   Comment: (NOTE)           Normal Reference Range for each Analyte: NOT DETECTED     Testing performed using the Luminex xTAG Respiratory Viral Panel test     kit.     This test was developed and its performance characteristics determined     by [facility]. It has not been cleared or approved by the [place]     Food and Drug Administration. This test is used for clinical purposes.     It should not be regarded as investigational or for research. This     laboratory is certified under the [place] (CLIA) as qualified to perform high complexity     clinical laboratory testing.     Performed at [facility], BLOOD (ROUTINE X 2)     Status: None   Collection Time    02/13/14  8:44 AM      Result Value Ref Range Status   Specimen Description BLOOD LEFT ANTECUBITAL   Final   Special Requests     Final   Value: BOTTLES DRAWN AEROBIC AND ANAEROBIC 10CC BLUE, [place] RED   Culture  Setup Time     Final   Value: 02/13/2014 14:22     Performed at [facility]   Culture     Final   Value:        BLOOD CULTURE RECEIVED NO GROWTH TO DATE CULTURE WILL BE HELD FOR 5 DAYS BEFORE ISSUING A FINAL NEGATIVE REPORT     Performed at [facility]   Report Status PENDING   Incomplete    CULTURE, BLOOD (ROUTINE X 2)     Status: None   Collection  Time    02/13/14  8:52 AM      Result Value Ref Range Status   Specimen Description BLOOD LEFT HAND   Final   Special Requests BOTTLES DRAWN AEROBIC AND ANAEROBIC 10CC   Final   Culture  Setup Time     Final   Value: 02/13/2014 14:22     Performed at [facility]   Culture     Final   Value:        BLOOD CULTURE RECEIVED NO GROWTH TO DATE CULTURE WILL BE HELD FOR 5 DAYS BEFORE ISSUING A FINAL NEGATIVE REPORT     Performed at [facility]   Report Status PENDING   Incomplete     Studies: Ct Chest W Contrast  02/16/2014   CLINICAL DATA:  Large, mobile, possibly cavitary mass seen in the right pulmonary artery on transesophageal echocardiography earlier today. CTA of the lungs was recommended.  EXAM: CT CHEST WITH CONTRAST  TECHNIQUE: Multidetector CT imaging of the chest was performed during intravenous contrast administration.  CONTRAST:  30m OMNIPAQUE IOHEXOL 300 MG/ML  SOLN  COMPARISON:  Report of the transesophageal echocardiogram obtained earlier today. Chest radiographs dated 02/12/2014.  FINDINGS: A routine chest CT with contrast was requested and performed. Therefore, the evaluation of the pulmonary arteries is not as optimal as would have been had a CTA been done.  There is a curvilinear filling defect in the main pulmonary artery. There are also filling defects in the right main pulmonary artery and its branches.  Small bilateral pleural effusions. Mild bilateral lower lobe atelectasis, greater on the left. No lung nodules or enlarged lymph nodes. Normal-sized heart. No lung nodules or enlarged lymph nodes. 4.9 x 4.3 x 3.2 cm oval, fluid filled mass in the anterior mediastinum, anterior to the ascending thoracic aorta. This measures 9 Hounsfield units in density. There is also a small amount of pericardial fluid posterior and lateral to the ascending thoracic aorta. This appears separate from the anterior  mediastinal collection of fluid.  Thoracic spine degenerative changes.  Unremarkable upper abdomen.  IMPRESSION: 1. Multiple pulmonary emboli. 2. Small bilateral pleural effusions. 3. Mild bilateral lower lobe atelectasis. 4. Small amount of superiorly located pericardial fluid. 5. Larger amount of anteriorly loculated pericardial fluid, superior pericardial cyst or thymic cyst. Critical Value/emergent results were called by telephone at the time of interpretation on 02/16/2014 at 8:03 PM to JMartinique the patient's nurse, who verbally acknowledged these results. She stated that she would inform the physician on call tonight.   Electronically Signed   By: SEnrique SackM.D.   On: 02/16/2014 20:07   2D Echocardiogram without contrast  02/13/14 Study Conclusions- Left ventricle: The cavity size was mildly dilated. Wall thickness was normal. Systolic function was mildly reduced. The estimated ejection fraction was in the range of 45% to 50%. Diffuse hypokinesis. - Right ventricle: The cavity size was moderately dilated. - Right atrium: The atrium was moderately dilated. - Atrial septum: No defect or patent foramen ovale was identified. Aortic valve: Mildly thickened leaflets. Doppler: There was no stenosis. Mitral valve: Mildly thickened leaflets . Doppler: No significant regurgitation. Pulmonic valve: Structurally normal valve. Cusp separation was normal. Doppler: Transvalvular velocity was within the normal range. Trivial regurgitation. Tricuspid valve: Structurally normal valve. Leaflet separation was normal. Doppler: Transvalvular velocity was within the normal range. Mild regurgitation. Transthoracic echocardiography. M-mode, complete 2D, spectral Doppler, and color Doppler.   Prepared and Electronically Authenticated by NJenkins [id number]:[id number]    Scheduled  Meds: . cefTRIAXone (ROCEPHIN)  IV  2 g Intravenous Q12H  . levothyroxine  125 mcg Oral QAC breakfast  . Vitamin D (Ergocalciferol)  50,000  Units Oral Q7 days   Continuous Infusions: . sodium chloride 75 mL/hr at 02/18/14 0333  . heparin 1,750 Units/hr (02/18/14 0201)       Time spent: 35 minutes  Verlee Monte, MD [facility] Pager [phone] If 7PM-7AM, please contact night-coverage at [email or web address], password [facility] 02/18/2014, 10:11 AM  LOS: 6 days

## 2014-02-19 ENCOUNTER — Encounter (HOSPITAL_COMMUNITY): Payer: Self-pay | Admitting: Internal Medicine

## 2014-02-19 DIAGNOSIS — I330 Acute and subacute infective endocarditis: Secondary | ICD-10-CM

## 2014-02-19 DIAGNOSIS — I2690 Septic pulmonary embolism without acute cor pulmonale: Secondary | ICD-10-CM

## 2014-02-19 LAB — CULTURE, BLOOD (ROUTINE X 2)
CULTURE: NO GROWTH
Culture: NO GROWTH

## 2014-02-19 MED ORDER — HEPARIN SOD (PORK) LOCK FLUSH 100 UNIT/ML IV SOLN
250.0000 [IU] | INTRAVENOUS | Status: AC | PRN
  Administered 2014-02-19: 18:00:00

## 2014-02-19 MED ORDER — DEXTROSE 5 % IV SOLN: 2.0000 g | Freq: Two times a day (BID) | INTRAVENOUS | Status: DC

## 2014-02-19 MED ORDER — LEVOTHYROXINE SODIUM 125 MCG PO TABS: 125.0000 ug | ORAL_TABLET | Freq: Every day | ORAL | Status: DC

## 2014-02-19 MED ORDER — RIVAROXABAN (XARELTO) VTE STARTER PACK (15 & 20 MG): ORAL_TABLET | ORAL | Status: DC

## 2014-02-19 MED ORDER — DEXTROSE 5 % IV SOLN: 2.0000 g | INTRAVENOUS | Status: DC

## 2014-02-19 NOTE — Progress Notes (Signed)
INFECTIOUS DISEASE PROGRESS NOTE  ID: Jeffrey Guerrero is a 63 y.o. male with  Principal Problem:   Septic shock Active Problems:   Unspecified hypothyroidism   Essential hypertension, benign   Leukocytopenia, unspecified   Gram-positive bacteremia   ARF (acute renal failure)   HTN (hypertension)   Acute pulmonary embolism  Subjective: Without complaints  Abtx:  Anti-infectives   Start     Dose/Rate Route Frequency Ordered Stop   02/19/14 0000  cefTRIAXone 2 g in dextrose 5 % 50 mL     2 g 100 mL/hr over 30 Minutes Intravenous Every 12 hours 02/19/14 1504     02/15/14 2200  cefTRIAXone (ROCEPHIN) 2 g in dextrose 5 % 50 mL IVPB     2 g 100 mL/hr over 30 Minutes Intravenous Every 12 hours 02/15/14 1115     02/15/14 1000  cefTRIAXone (ROCEPHIN) 2 g in dextrose 5 % 50 mL IVPB  Status:  Discontinued     2 g 100 mL/hr over 30 Minutes Intravenous Every 24 hours 02/15/14 0919 02/15/14 1115   02/13/14 1200  vancomycin (VANCOCIN) IVPB 750 mg/150 ml premix  Status:  Discontinued     750 mg 150 mL/hr over 60 Minutes Intravenous Every 12 hours 02/13/14 0127 02/15/14 0918   02/13/14 0130  vancomycin (VANCOCIN) IVPB 750 mg/150 ml premix     750 mg 150 mL/hr over 60 Minutes Intravenous  Once 02/13/14 0127 02/13/14 0258   02/12/14 2230  piperacillin-tazobactam (ZOSYN) IVPB 3.375 g  Status:  Discontinued     3.375 g 12.5 mL/hr over 240 Minutes Intravenous 3 times per day 02/12/14 2223 02/14/14 0922   02/12/14 2015  azithromycin (ZITHROMAX) 500 mg in dextrose 5 % 250 mL IVPB     500 mg 250 mL/hr over 60 Minutes Intravenous  Once 02/12/14 2000 02/12/14 2138   02/12/14 1945  cefTRIAXone (ROCEPHIN) 1 g in dextrose 5 % 50 mL IVPB     1 g 100 mL/hr over 30 Minutes Intravenous  Once 02/12/14 1941 02/12/14 2032      Medications:  Scheduled: . apixaban  10 mg Oral BID  . [START ON 02/25/2014] apixaban  5 mg Oral BID  . cefTRIAXone (ROCEPHIN)  IV  2 g Intravenous Q12H  . levothyroxine  125 mcg  Oral QAC breakfast  . Vitamin D (Ergocalciferol)  50,000 Units Oral Q7 days    Objective: Vital signs in last 24 hours: Temp:  [98.4 F (36.9 C)-100.5 F (38.1 C)] 99.4 F (37.4 C) (05/04 1418) Pulse Rate:  [73-77] 73 (05/04 1418) Resp:  [18-20] 18 (05/04 1418) BP: (132-151)/(76-85) 132/85 mmHg (05/04 1418) SpO2:  [92 %-94 %] 94 % (05/04 1418) Weight:  [93.4 kg (205 lb 14.6 oz)] 93.4 kg (205 lb 14.6 oz) (05/04 0551)   General appearance: alert, cooperative and no distress Resp: clear to auscultation bilaterally Cardio: regular rate and rhythm GI: normal findings: bowel sounds normal and soft, non-tender  Lab Results  Recent Labs  02/17/14 0825 02/18/14 0715  WBC 8.8 9.7  HGB 11.8* 11.8*  HCT 34.6* 34.8*  NA 143  --   K 4.2  --   CL 104  --   CO2 27  --   BUN 13  --   CREATININE 1.01  --    Liver Panel  Recent Labs  02/17/14 0825  PROT 5.9*  ALBUMIN 2.1*  AST 103*  ALT 129*  ALKPHOS 68  BILITOT 0.5   Sedimentation Rate No results found for  this basename: ESRSEDRATE,  in the last 72 hours C-Reactive Protein No results found for this basename: CRP,  in the last 72 hours  Microbiology: Recent Results (from the past 240 hour(s))  URINE CULTURE     Status: None   Collection Time    02/12/14  7:21 PM      Result Value Ref Range Status   Specimen Description URINE, CLEAN CATCH   Final   Special Requests NONE   Final   Culture  Setup Time     Final   Value: 02/12/2014 19:58     Performed at [facility] Count     Final   Value: NO GROWTH     Performed at [facility]   Culture     Final   Value: NO GROWTH     Performed at [facility]   Report Status 02/14/2014 FINAL   Final  CULTURE, BLOOD (ROUTINE X 2)     Status: None   Collection Time    02/12/14  7:30 PM      Result Value Ref Range Status   Specimen Description BLOOD RIGHT FOREARM   Final   Special Requests BOTTLES DRAWN AEROBIC AND ANAEROBIC 6CCBLUE 5CCRED    Final   Culture  Setup Time     Final   Value: 02/13/2014 01:15     Performed at [facility]   Culture     Final   Value: STREPTOCOCCUS PNEUMONIAE     Note: Gram Stain Report Called to,Read Back By and Verified With: Marlaine Hind ON 02/13/2014 AT 8:32P BY WILEJ     Performed at [facility]   Report Status 02/15/2014 FINAL   Final   Organism ID, Bacteria STREPTOCOCCUS PNEUMONIAE   Final  CULTURE, BLOOD (ROUTINE X 2)     Status: None   Collection Time    02/12/14  7:45 PM      Result Value Ref Range Status   Specimen Description BLOOD HAND LEFT   Final   Special Requests BOTTLES DRAWN AEROBIC AND ANAEROBIC 5CC   Final   Culture  Setup Time     Final   Value: 02/13/2014 01:15     Performed at [facility]   Culture     Final   Value: STREPTOCOCCUS PNEUMONIAE     Note: SUSCEPTIBILITIES PERFORMED ON PREVIOUS CULTURE WITHIN THE LAST 5 DAYS.     Note: Gram Stain Report Called to,Read Back By and Verified With: Marlaine Hind ON 02/13/2014 AT 8:32P BY Serafina Mitchell     Performed at [facility]   Report Status 02/15/2014 FINAL   Final  MRSA PCR SCREENING     Status: None   Collection Time    02/12/14 11:12 PM      Result Value Ref Range Status   MRSA by PCR NEGATIVE  NEGATIVE Final   Comment:            The GeneXpert MRSA Assay (FDA     approved for NASAL specimens     only), is one component of a     comprehensive MRSA colonization     surveillance program. It is not     intended to diagnose MRSA     infection nor to guide or     monitor treatment for     MRSA infections.  RESPIRATORY VIRUS PANEL     Status: None   Collection Time    02/13/14  2:03 AM  Result Value Ref Range Status   Source - RVPAN NASAL SWAB   Corrected   Comment: CORRECTED ON 04/29 AT 2038: PREVIOUSLY REPORTED AS NASAL SWAB   Respiratory Syncytial Virus A NOT DETECTED   Final   Respiratory Syncytial Virus B NOT DETECTED   Final   Influenza A NOT DETECTED   Final   Influenza  B NOT DETECTED   Final   Parainfluenza 1 NOT DETECTED   Final   Parainfluenza 2 NOT DETECTED   Final   Parainfluenza 3 NOT DETECTED   Final   Metapneumovirus NOT DETECTED   Final   Rhinovirus NOT DETECTED   Final   Adenovirus NOT DETECTED   Final   Influenza A H1 NOT DETECTED   Final   Influenza A H3 NOT DETECTED   Final   Comment: (NOTE)           Normal Reference Range for each Analyte: NOT DETECTED     Testing performed using the Luminex xTAG Respiratory Viral Panel test     kit.     This test was developed and its performance characteristics determined     by [facility]. It has not been cleared or approved by the [place]     Food and Drug Administration. This test is used for clinical purposes.     It should not be regarded as investigational or for research. This     laboratory is certified under the [place] (CLIA) as qualified to perform high complexity     clinical laboratory testing.     Performed at [place], BLOOD (ROUTINE X 2)     Status: None   Collection Time    02/13/14  8:44 AM      Result Value Ref Range Status   Specimen Description BLOOD LEFT ANTECUBITAL   Final   Special Requests     Final   Value: BOTTLES DRAWN AEROBIC AND ANAEROBIC 10CC BLUE, [place] RED   Culture  Setup Time     Final   Value: 02/13/2014 14:22     Performed at [facility]   Culture     Final   Value: NO GROWTH 5 DAYS     Performed at [facility]   Report Status 02/19/2014 FINAL   Final  CULTURE, BLOOD (ROUTINE X 2)     Status: None   Collection Time    02/13/14  8:52 AM      Result Value Ref Range Status   Specimen Description BLOOD LEFT HAND   Final   Special Requests BOTTLES DRAWN AEROBIC AND ANAEROBIC 10CC   Final   Culture  Setup Time     Final   Value: 02/13/2014 14:22     Performed at [facility]   Culture     Final   Value: NO GROWTH 5 DAYS     Performed at [facility]    Report Status 02/19/2014 FINAL   Final  CULTURE, BLOOD (ROUTINE X 2)     Status: None   Collection Time    02/18/14 11:35 AM      Result Value Ref Range Status   Specimen Description BLOOD LEFT HAND   Final   Special Requests BOTTLES DRAWN AEROBIC ONLY 5CC   Final   Culture  Setup Time     Final   Value: 02/18/2014 18:52     Performed at [facility]  Culture     Final   Value:        BLOOD CULTURE RECEIVED NO GROWTH TO DATE CULTURE WILL BE HELD FOR 5 DAYS BEFORE ISSUING A FINAL NEGATIVE REPORT     Performed at [facility]   Report Status PENDING   Incomplete  CULTURE, BLOOD (ROUTINE X 2)     Status: None   Collection Time    02/18/14 11:50 AM      Result Value Ref Range Status   Specimen Description BLOOD LEFT HAND   Final   Special Requests BOTTLES DRAWN AEROBIC ONLY 5CC   Final   Culture  Setup Time     Final   Value: 02/18/2014 18:52     Performed at [facility]   Culture     Final   Value:        BLOOD CULTURE RECEIVED NO GROWTH TO DATE CULTURE WILL BE HELD FOR 5 DAYS BEFORE ISSUING A FINAL NEGATIVE REPORT     Performed at [facility]   Report Status PENDING   Incomplete    Studies/Results: No results found.   Assessment/Plan: Pulmonic Valve IE Septic Pulmonary Emboli Pneumococcal Bacteremia  Total days of antibiotics: 9 (ceftriaxone)  Afebrile so far today.  Would give pt 6 weeks of IV ceftriaxone 2g q24h. Would have him f/u in ID clinic 3-4 weeks.           [facility] (pager) [phone] [email or web address] 02/19/2014, 4:03 PM  LOS: 7 days   **Disclaimer: This note may have been dictated with voice recognition software. Similar sounding words can inadvertently be transcribed and this note may contain transcription errors which may not have been corrected upon publication of note.**

## 2014-02-19 NOTE — Progress Notes (Signed)
Pt discharged per orders. IV team hep lock and flushed PICC prior to d/c home. Teach back method and ask me 3 method used with patient. Pt verbalized understanding of all discharge teaching. All belongings returned to patient. Skin intact upon discharge home. Pt with no further questions and stable upon discharge home.

## 2014-02-19 NOTE — Discharge Instructions (Signed)
Information on my medicine - XARELTO (rivaroxaban)  This medication education was reviewed with me or my healthcare representative as part of my discharge preparation.  The pharmacist that spoke with me during my hospital stay was:  Arty Baumgartner, [facility]  WHY WAS Alveda Reasons PRESCRIBED FOR YOU? Xarelto was prescribed to treat blood clots that may have been found in the veins in your lungs (pulmonary embolism) and to reduce the risk of them occurring again.  What do you need to know about Xarelto? The starting dose is one 15 mg tablet taken TWICE daily with food for the FIRST 21 DAYS then on (enter date)  03/13/14  the dose is changed to one 20 mg tablet taken ONCE A DAY with your evening meal.  DO NOT stop taking Xarelto without talking to the health care provider who prescribed the medication.  Refill your prescription for 20 mg tablets before you run out.  After discharge, you should have regular check-up appointments with your healthcare provider that is prescribing your Xarelto.  In the future your dose may need to be changed if your kidney function changes by a significant amount.  What do you do if you miss a dose? If you are taking Xarelto TWICE DAILY and you miss a dose, take it as soon as you remember. You may take two 15 mg tablets (total 30 mg) at the same time then resume your regularly scheduled 15 mg twice daily the next day.  If you are taking Xarelto ONCE DAILY and you miss a dose, take it as soon as you remember on the same day then continue your regularly scheduled once daily regimen the next day. Do not take two doses of Xarelto at the same time.   Important Safety Information Xarelto is a blood thinner medicine that can cause bleeding. You should call your healthcare provider right away if you experience any of the following:   Bleeding from an injury or your nose that does not stop.   Unusual colored urine (red or dark brown) or unusual colored stools (red or  black).   Unusual bruising for unknown reasons.   A serious fall or if you hit your head (even if there is no bleeding).  Some medicines may interact with Xarelto and might increase your risk of bleeding while on Xarelto. To help avoid this, consult your healthcare provider or pharmacist prior to using any new prescription or non-prescription medications, including herbals, vitamins, non-steroidal anti-inflammatory drugs (NSAIDs) and supplements.  This website has more information on Xarelto: [email or web address]

## 2014-02-19 NOTE — Discharge Summary (Signed)
Physician Discharge Summary  Jeffrey Guerrero VFI:[id number] DOB: 02-13-1951 DOA: 02/12/2014  PCP: [name], MD  Admit date: 02/12/2014 Discharge date: 02/19/2014  Time spent: 40 minutes  Recommendations for Outpatient Follow-up:  1. Follow up with PCP, Follow up with ID in 2-3 weeks. 2. Continue Rocephin for 5 more weeks, total of 6 weeks. (Antibiotics started on 02/13/2014)  Discharge Diagnoses:  Principal Problem:   Septic shock Active Problems:   Unspecified hypothyroidism   Essential hypertension, benign   Leukocytopenia, unspecified   Gram-positive bacteremia   ARF (acute renal failure)   HTN (hypertension)   Acute pulmonary embolism   Discharge Condition: Stable  Diet recommendation: Heart healthy  Filed Weights   02/17/14 2135 02/18/14 0541 02/19/14 0551  Weight: 92.5 kg (203 lb 14.8 oz) 92.6 kg (204 lb 2.3 oz) 93.4 kg (205 lb 14.6 oz)    History of present illness:  Jeffrey Guerrero is a 63 y.o. M with PMH of HTN and hypothyroidism, who presented 4/27 after a syncopal episode at home. He states that he was in his normal state of health and was reaching for something in his refrigerator when he suddenly had syncopal episode with positive LOC. Daughter proceeded to bring him to the ED for further evaluation. Patient did not lose control of bladder or bowel movement. Denies shortness of breath, chest pain. She has a mild cough without sputum production. No symptoms for UTI. No nausea, vomiting, diarrhea or abdominal pain. In ED, head CT was negative for acute process. Pt was initially stable, but then he became hypotensive and febrile to 103. BP did not respond to fluids and Levophed was started, code sepsis called. PCCM was asked for admission  Hospital Course:   Streptococcus bacteremia  - 2 out of 2 blood cultures from 4/27 showed streptococcus pneumoniae.  -CXR, Head & Abd CT, 2D Cardiac Echo, Renal US all negative for acute processes.  -Repeat blood cultures done on 4/28  showed no growth to date  -Continue Rocephin for a total of 6 weeks, a midline placed for long-term antibiotics.   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  -TEE obtained and showed pulmonic valve vegetation. Also showed a mobile mass in the pulmonary artery.  -ID recommended 2 g of Rocephin for total of 6 weeks.   Acute PE  -CT with contrast of the chest was done because of findings of large mobile mass in the right pulmonary artery.  -Showed multiple PEs, discussed with radiology, favor PE over septic emboli.  -Had positive d-dimer is a negative Dopplers, on admission, patient started initially on heparin drip.  -Discharge on Xarelto, likely for 3 to 6 months  Septic shock  -Patient admitted initially for syncope, which is likely secondary to sepsis and/or PE.  -became severely hypotensive in the ED resulting in 3L+ NS IVF and short duration of Levophed to raise BP  -Multiple testing to id source, resp panel pending & Legionella negative  -Resolved: PCT and lactic acid down trending, WBC 6.2  -Rocephin & Azithromycin x1 in ED, Zosyn x 3 days-d/c 4/29, now continue on Vancomycin x3 days-d/c 4/30,  -Rocephin on D/C.  Syncope  -Single episode, no warning feeling or prodrome, due to endocarditis and sepsis.  -Head CT normal, LE Doppler-negative, D-dimer 3.42, CTA was not checked initially because NO chest pain or hypoxia.  -small healing lac to face  -No noted arrythmia or mental status changes, d/c telemetry   Acute Kidney Injury/ATN  -Resolving, acute issue due to severe  hypotensive crisis/sepsis, no reported history  -BUN down trending 35 to 26, sCr initially 1.80, now 1.03 down trending,  -no hydronephrosis on Abd CT, no acute issue on Renal U/S  -This is resolved.   Hypokalemia  -continues to be low, 3.4  -Replete w/ KCl PO   Hypothyroidism  -Chronic, elevated TSH at 7.810.  -Increase Synthroid to 125. Check TSH in 4 weeks as  outpatient.  Anterior mediastinal mass -4.9 x 4.3 x 3.2 cm oval, fluid filled mass in the anterior mediastinum. -Differential diagnoses include pericardial cyst, thymic cyst or loculated pericardial fluid. -Discussed with Dr. Roxan Hockey of [facility], recommended repeat CT scan as outpatient in 3-4 months. -Discussed with the patient, patient is aware.   Procedures: Placement of right arm midline.  Consultations:  Infectious disease.  Patient was in the PCCM initially, transferred to hospitalist service on 02/14/2014.  Discharge Exam: Filed Vitals:   02/19/14 1418  BP: 132/85  Pulse: 73  Temp: 99.4 F (37.4 C)  Resp: 18   General: Alert and awake, oriented x3, not in any acute distress. HEENT: anicteric sclera, pupils reactive to light and accommodation, EOMI CVS: S1-S2 clear, no murmur rubs or gallops Chest: clear to auscultation bilaterally, no wheezing, rales or rhonchi Abdomen: soft nontender, nondistended, normal bowel sounds, no organomegaly Extremities: no cyanosis, clubbing or edema noted bilaterally Neuro: Cranial nerves II-XII intact, no focal neurological deficits  Discharge Instructions You were cared for by a hospitalist during your hospital stay. If you have any questions about your discharge medications or the care you received while you were in the hospital after you are discharged, you can call the unit and asked to speak with the hospitalist on call if the hospitalist that took care of you is not available. Once you are discharged, your primary care physician will handle any further medical issues. Please note that NO REFILLS for any discharge medications will be authorized once you are discharged, as it is imperative that you return to your primary care physician (or establish a relationship with a primary care physician if you do not have one) for your aftercare needs so that they can reassess your need for medications and monitor your lab values.      Discharge  Orders   Future Orders Complete By Expires   Diet - low sodium heart healthy  As directed    Increase activity slowly  As directed        Medication List         amLODipine 5 MG tablet  Commonly known as:  NORVASC  Take 5 mg by mouth daily.     cefTRIAXone 2 g in dextrose 5 % 50 mL  Inject 2 g into the vein daily.     levothyroxine 125 MCG tablet  Commonly known as:  SYNTHROID, LEVOTHROID  Take 1 tablet (125 mcg total) by mouth daily before breakfast.     Rivaroxaban 15 & 20 MG Tbpk  Commonly known as:  XARELTO STARTER PACK  Take as directed on package: Start with one $Remove'15mg'fiQCakA$  tablet by mouth twice a day with food. On Day 22, switch to one $Remo'20mg'BWOyS$  tablet once a day with food.     Vitamin D (Ergocalciferol) 50000 UNITS Caps capsule  Commonly known as:  DRISDOL  Take 50,000 Units by mouth every 7 (seven) days. Wednesday       No Known Allergies Follow-up Information   Follow up with CLOWARD,DAVIS L, MD In 1 week.   Specialty:  Internal Medicine  Contact information:   [place] [id number] [phone]       Follow up with Alcide Evener, MD In 2 weeks.   Specialty:  Infectious Diseases   Contact information:   [address] [place] [address] [phone]        The results of significant diagnostics from this hospitalization (including imaging, microbiology, ancillary and laboratory) are listed below for reference.    Significant Diagnostic Studies: Ct Abdomen Pelvis Wo Contrast  02/12/2014   CLINICAL DATA:  Evaluate abdominal aortic aneurysm  EXAM: CT ABDOMEN AND PELVIS WITHOUT CONTRAST  TECHNIQUE: Multidetector CT imaging of the abdomen and pelvis was performed following the standard protocol without intravenous contrast.  COMPARISON:  None.  FINDINGS: The caliber of the abdominal aorta is normal and exhibits a normal tapered contour. There is mural calcification in the infrarenal aorta and common iliac  arteries. The maximal diameter of the right common iliac artery is 1.5 cm. The maximal diameter of the left common iliac artery is 1.3 cm. The periaortic and pericaval soft tissues are normal in appearance.  The liver, gallbladder, pancreas, spleen, and adrenal glands are normal in appearance. The kidneys exhibit no evidence of obstruction or of calcified stones. In the lateral aspect of the upper pole of the left kidney there is a hypodensity measuring approximately 1.8 x 2.1 cm which exhibits Hounsfield measurement of -4 and likely reflects a cyst. The perinephric fat is normal in density. The non-opacified loops of small and large bowel exhibit no evidence of ileus or obstruction or acute inflammation. A normal calibered, uninflamed-appearing appendix is demonstrated on images 64-70 of series 2. Within the pelvis the urinary bladder, prostate gland, and seminal vesicles exhibit no acute abnormalities. There is no inguinal nor significant umbilical hernia. The psoas musculature is normal in appearance. The lumbar vertebral bodies are preserved in height. There are bilateral pars defects at L5 without evidence of spondylolisthesis. The bony pelvis exhibits no acute abnormalities. The lung bases are clear.  IMPRESSION: 1. There is no evidence of an abdominal aortic aneurysm. No iliac artery aneurysm or splenic artery aneurysm is demonstrated. The periaortic and pericaval regions appear normal. 2. There is no acute hepatobiliary abnormality nor acute urinary tract abnormality. A hypodensity in the upper pole of the left kidney likely reflects a cyst. 3. There is no acute bowel abnormality. 4. There are bilateral pars defects at L5 without evidence of spondylolisthesis.   Electronically Signed   By: David  Martinique   On: 02/12/2014 19:03   Dg Chest 2 View  02/12/2014   CLINICAL DATA:  STATUS POST FALL EARLIER TODAY, LOSS CONSCIOUSNESS  EXAM: CHEST  2 VIEW  COMPARISON:  None.  FINDINGS: The heart size and mediastinal  contours are within normal limits. Both lungs are clear. The visualized skeletal structures are unremarkable.  IMPRESSION: No active cardiopulmonary disease.   Electronically Signed   By: Kathreen Devoid   On: 02/12/2014 18:51   Ct Head Wo Contrast  02/12/2014   CLINICAL DATA:  Loss of consciousness.  EXAM: CT HEAD WITHOUT CONTRAST  TECHNIQUE: Contiguous axial images were obtained from the base of the skull through the vertex without contrast.  COMPARISON:  None  FINDINGS: No evidence for acute hemorrhage, mass lesion, midline shift, hydrocephalus or large infarct. The visualized sinuses are clear. No acute bone abnormality.  IMPRESSION: No acute intracranial abnormality.   Electronically Signed   By: Scherrie Gerlach.D.  On: 02/12/2014 16:05   Ct Chest W Contrast  02/16/2014   CLINICAL DATA:  Large, mobile, possibly cavitary mass seen in the right pulmonary artery on transesophageal echocardiography earlier today. CTA of the lungs was recommended.  EXAM: CT CHEST WITH CONTRAST  TECHNIQUE: Multidetector CT imaging of the chest was performed during intravenous contrast administration.  CONTRAST:  34mL OMNIPAQUE IOHEXOL 300 MG/ML  SOLN  COMPARISON:  Report of the transesophageal echocardiogram obtained earlier today. Chest radiographs dated 02/12/2014.  FINDINGS: A routine chest CT with contrast was requested and performed. Therefore, the evaluation of the pulmonary arteries is not as optimal as would have been had a CTA been done.  There is a curvilinear filling defect in the main pulmonary artery. There are also filling defects in the right main pulmonary artery and its branches.  Small bilateral pleural effusions. Mild bilateral lower lobe atelectasis, greater on the left. No lung nodules or enlarged lymph nodes. Normal-sized heart. No lung nodules or enlarged lymph nodes. 4.9 x 4.3 x 3.2 cm oval, fluid filled mass in the anterior mediastinum, anterior to the ascending thoracic aorta. This measures 9 Hounsfield  units in density. There is also a small amount of pericardial fluid posterior and lateral to the ascending thoracic aorta. This appears separate from the anterior mediastinal collection of fluid.  Thoracic spine degenerative changes.  Unremarkable upper abdomen.  IMPRESSION: 1. Multiple pulmonary emboli. 2. Small bilateral pleural effusions. 3. Mild bilateral lower lobe atelectasis. 4. Small amount of superiorly located pericardial fluid. 5. Larger amount of anteriorly loculated pericardial fluid, superior pericardial cyst or thymic cyst. Critical Value/emergent results were called by telephone at the time of interpretation on 02/16/2014 at 8:03 PM to [place], the patient's nurse, who verbally acknowledged these results. She stated that she would inform the physician on call tonight.   Electronically Signed   By: Enrique Sack M.D.   On: 02/16/2014 20:07   US Renal  02/13/2014   CLINICAL DATA:  Acute renal failure.  EXAM: RENAL/URINARY TRACT ULTRASOUND COMPLETE  COMPARISON:  CT scan dated 02/12/2014  FINDINGS: Right Kidney:  Length: 10.8 cm. Two tiny cysts on the lower pole, 8 and 9 mm. Otherwise normal.  Left Kidney:  Length: 11.8 cm. 2.3 cm simple appearing cyst in the mid kidney. No hydronephrosis.  Bladder:  Appears normal for degree of bladder distention.  IMPRESSION: Small cysts in each kidney.  Otherwise, normal exam.   Electronically Signed   By: Rozetta Nunnery M.D.   On: 02/13/2014 09:57   Dg Chest Port 1 View  02/13/2014   CLINICAL DATA:  Sepsis.  Cough.  EXAM: PORTABLE CHEST - 1 VIEW  COMPARISON:  02/12/2014  FINDINGS: Heart size is normal. Pulmonary vascularity is at the upper limits of normal. Lungs are clear. No significant osseous abnormality.  IMPRESSION: No acute abnormalities.   Electronically Signed   By: Rozetta Nunnery M.D.   On: 02/13/2014 07:28    Microbiology: Recent Results (from the past 240 hour(s))  URINE CULTURE     Status: None   Collection Time    02/12/14  7:21 PM      Result  Value Ref Range Status   Specimen Description URINE, CLEAN CATCH   Final   Special Requests NONE   Final   Culture  Setup Time     Final   Value: 02/12/2014 19:58     Performed at [place]     Final   Value: NO  GROWTH     Performed at [facility]   Culture     Final   Value: NO GROWTH     Performed at [facility]   Report Status 02/14/2014 FINAL   Final  CULTURE, BLOOD (ROUTINE X 2)     Status: None   Collection Time    02/12/14  7:30 PM      Result Value Ref Range Status   Specimen Description BLOOD RIGHT FOREARM   Final   Special Requests BOTTLES DRAWN AEROBIC AND ANAEROBIC 6CCBLUE 5CCRED   Final   Culture  Setup Time     Final   Value: 02/13/2014 01:15     Performed at [facility]   Culture     Final   Value: STREPTOCOCCUS PNEUMONIAE     Note: Gram Stain Report Called to,Read Back By and Verified With: Mardene Celeste ON 02/13/2014 AT 8:32P BY WILEJ     Performed at [facility]   Report Status 02/15/2014 FINAL   Final   Organism ID, Bacteria STREPTOCOCCUS PNEUMONIAE   Final  CULTURE, BLOOD (ROUTINE X 2)     Status: None   Collection Time    02/12/14  7:45 PM      Result Value Ref Range Status   Specimen Description BLOOD HAND LEFT   Final   Special Requests BOTTLES DRAWN AEROBIC AND ANAEROBIC 5CC   Final   Culture  Setup Time     Final   Value: 02/13/2014 01:15     Performed at [facility]   Culture     Final   Value: STREPTOCOCCUS PNEUMONIAE     Note: SUSCEPTIBILITIES PERFORMED ON PREVIOUS CULTURE WITHIN THE LAST 5 DAYS.     Note: Gram Stain Report Called to,Read Back By and Verified With: Mardene Celeste ON 02/13/2014 AT 8:32P BY Dennard Nip     Performed at [facility]   Report Status 02/15/2014 FINAL   Final  MRSA PCR SCREENING     Status: None   Collection Time    02/12/14 11:12 PM      Result Value Ref Range Status   MRSA by PCR NEGATIVE  NEGATIVE Final   Comment:            The  GeneXpert MRSA Assay (FDA     approved for NASAL specimens     only), is one component of a     comprehensive MRSA colonization     surveillance program. It is not     intended to diagnose MRSA     infection nor to guide or     monitor treatment for     MRSA infections.  RESPIRATORY VIRUS PANEL     Status: None   Collection Time    02/13/14  2:03 AM      Result Value Ref Range Status   Source - RVPAN NASAL SWAB   Corrected   Comment: CORRECTED ON 04/29 AT 2038: PREVIOUSLY REPORTED AS NASAL SWAB   Respiratory Syncytial Virus A NOT DETECTED   Final   Respiratory Syncytial Virus B NOT DETECTED   Final   Influenza A NOT DETECTED   Final   Influenza B NOT DETECTED   Final   Parainfluenza 1 NOT DETECTED   Final   Parainfluenza 2 NOT DETECTED   Final   Parainfluenza 3 NOT DETECTED   Final   Metapneumovirus NOT DETECTED   Final   Rhinovirus NOT DETECTED   Final   Adenovirus  NOT DETECTED   Final   Influenza A H1 NOT DETECTED   Final   Influenza A H3 NOT DETECTED   Final   Comment: (NOTE)           Normal Reference Range for each Analyte: NOT DETECTED     Testing performed using the Luminex xTAG Respiratory Viral Panel test     kit.     This test was developed and its performance characteristics determined     by [facility]. It has not been cleared or approved by the [place]     Food and Drug Administration. This test is used for clinical purposes.     It should not be regarded as investigational or for research. This     laboratory is certified under the [place] (CLIA) as qualified to perform high complexity     clinical laboratory testing.     Performed at [facility], BLOOD (ROUTINE X 2)     Status: None   Collection Time    02/13/14  8:44 AM      Result Value Ref Range Status   Specimen Description BLOOD LEFT ANTECUBITAL   Final   Special Requests     Final   Value: BOTTLES DRAWN AEROBIC AND ANAEROBIC 10CC  BLUE, [place] RED   Culture  Setup Time     Final   Value: 02/13/2014 14:22     Performed at [facility]   Culture     Final   Value: NO GROWTH 5 DAYS     Performed at [facility]   Report Status 02/19/2014 FINAL   Final  CULTURE, BLOOD (ROUTINE X 2)     Status: None   Collection Time    02/13/14  8:52 AM      Result Value Ref Range Status   Specimen Description BLOOD LEFT HAND   Final   Special Requests BOTTLES DRAWN AEROBIC AND ANAEROBIC 10CC   Final   Culture  Setup Time     Final   Value: 02/13/2014 14:22     Performed at [facility]   Culture     Final   Value: NO GROWTH 5 DAYS     Performed at [facility]   Report Status 02/19/2014 FINAL   Final  CULTURE, BLOOD (ROUTINE X 2)     Status: None   Collection Time    02/18/14 11:35 AM      Result Value Ref Range Status   Specimen Description BLOOD LEFT HAND   Final   Special Requests BOTTLES DRAWN AEROBIC ONLY 5CC   Final   Culture  Setup Time     Final   Value: 02/18/2014 18:52     Performed at [facility]   Culture     Final   Value:        BLOOD CULTURE RECEIVED NO GROWTH TO DATE CULTURE WILL BE HELD FOR 5 DAYS BEFORE ISSUING A FINAL NEGATIVE REPORT     Performed at [facility]   Report Status PENDING   Incomplete  CULTURE, BLOOD (ROUTINE X 2)     Status: None   Collection Time    02/18/14 11:50 AM      Result Value Ref Range Status   Specimen Description BLOOD LEFT HAND   Final   Special Requests BOTTLES DRAWN AEROBIC ONLY 5CC   Final   Culture  Setup Time  Final   Value: 02/18/2014 18:52     Performed at [facility]   Culture     Final   Value:        BLOOD CULTURE RECEIVED NO GROWTH TO DATE CULTURE WILL BE HELD FOR 5 DAYS BEFORE ISSUING A FINAL NEGATIVE REPORT     Performed at [facility]   Report Status PENDING   Incomplete     Labs: Basic Metabolic Panel:  Recent Labs Lab 02/12/14 2212 02/13/14 0500 02/14/14 0457 02/15/14 0648  02/17/14 0825  NA  --  138 140 142 143  K  --  3.5* 3.4* 3.4* 4.2  CL  --  105 107 108 104  CO2  --  $R'21 22 24 27  'Mm$ GLUCOSE  --  157* 199* 106* 111*  BUN  --  32* 28* 26* 13  CREATININE  --  1.39* 1.14 1.03 1.01  CALCIUM  --  7.3* 7.9* 7.7* 8.2*  MG 2.2 2.3 2.7*  --   --   PHOS 2.3 2.2* 2.5  --   --    Liver Function Tests:  Recent Labs Lab 02/12/14 2212 02/15/14 0648 02/17/14 0825  AST 35 20 103*  ALT 24 25 129*  ALKPHOS 66 53 68  BILITOT 0.5 0.3 0.5  PROT 6.3 5.5* 5.9*  ALBUMIN 2.4* 2.1* 2.1*    Recent Labs Lab 02/12/14 2212  LIPASE 26  AMYLASE 28   No results found for this basename: AMMONIA,  in the last 168 hours CBC:  Recent Labs Lab 02/14/14 0457 02/15/14 0648 02/16/14 0812 02/17/14 0825 02/18/14 0715  WBC 22.6* 6.2 6.6 8.8 9.7  NEUTROABS 20.6* 3.9 4.0  --   --   HGB 11.7* 11.4* 12.5* 11.8* 11.8*  HCT 33.2* 33.3* 36.0* 34.6* 34.8*  MCV 85.8 87.9 86.7 88.0 87.9  PLT 115* 105* 105* 130* 152   Cardiac Enzymes:  Recent Labs Lab 02/12/14 2212 02/13/14 0412 02/13/14 0844 02/13/14 1245 02/14/14 0457  CKTOTAL 547*  --  493*  --  182  CKMB  --   --  4.4*  --  4.2*  TROPONINI <0.30 <0.30  --  <0.30  --    BNP: BNP (last 3 results)  Recent Labs  02/12/14 2212  PROBNP 1062.0*   CBG:  Recent Labs Lab 02/12/14 2313  GLUCAP 162*       Signed:  Anela Bensman  [facility] 02/19/2014, 4:47 PM

## 2014-02-24 LAB — CULTURE, BLOOD (ROUTINE X 2)
CULTURE: NO GROWTH
Culture: NO GROWTH

## 2014-03-02 ENCOUNTER — Telehealth: Payer: Self-pay | Admitting: *Deleted

## 2014-03-02 NOTE — Telephone Encounter (Signed)
Dr Marlou Porch received a call from Dr Thea Silversmith (PCP), recent hospitalization discussed. Patient needs to come in and see Dr [name] specifically in 4-6 weeks. Will forward to Luisa Dago RN for scheduling.

## 2014-03-05 NOTE — Telephone Encounter (Signed)
Spoke with pt wife, Follow up scheduled  

## 2014-03-16 ENCOUNTER — Other Ambulatory Visit: Payer: Self-pay | Admitting: Internal Medicine

## 2014-03-16 ENCOUNTER — Telehealth: Payer: Self-pay | Admitting: Internal Medicine

## 2014-03-16 ENCOUNTER — Other Ambulatory Visit: Payer: Self-pay | Admitting: *Deleted

## 2014-03-16 ENCOUNTER — Ambulatory Visit (HOSPITAL_COMMUNITY)
Admit: 2014-03-16 | Discharge: 2014-03-16 | Disposition: A | Discharge status: Home / Self Care | Payer: 59 | Attending: Internal Medicine | Admitting: Internal Medicine

## 2014-03-16 DIAGNOSIS — I330 Acute and subacute infective endocarditis: Secondary | ICD-10-CM

## 2014-03-16 MED ORDER — HEPARIN SOD (PORK) LOCK FLUSH 100 UNIT/ML IV SOLN
INTRAVENOUS | Status: AC
  Filled 2014-03-16: qty 5

## 2014-03-16 NOTE — Procedures (Signed)
US/fluoroscopic guided right DL basilic vein PICC placed. Length 47 cm. Tip SVC/RA junction. Previously placed RUE venous line removed due to inability to advance guidewire beyond catheter tip in upper arm. No immediate complications.

## 2014-03-16 NOTE — Telephone Encounter (Signed)
Received call from [facility] RN who mentioned that at her visit today with the patient she noticed that the picc line had increased leakage and concern for malfunction. Hx confirmed by the patient's wife. Recommend that we will need picc line replacement. Clinic will attempt to have this done today

## 2014-03-16 NOTE — Telephone Encounter (Signed)
Alaina RN at [facility] ED was able to arrange for patient to go to Interventional Radiology and they will work the patient in to have his picc line replaced. Myrtis Hopping CMA

## 2014-03-29 ENCOUNTER — Telehealth: Payer: Self-pay | Admitting: *Deleted

## 2014-03-29 NOTE — Progress Notes (Signed)
HPI Patient is a 63 yo who was hospitalized earlier this spring with Sepsis.  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  Also pulmonray emboli.   He has been on IV ABX since D/C  Has appt with Dr Johnnye Sima on Monday He says his breathing is better  He deines CP  No Dizziness.  Appetite good  Sleeping good No palpitations.   No Known Allergies  Current Outpatient Prescriptions  Medication Sig Dispense Refill  . amLODipine (NORVASC) 5 MG tablet Take 5 mg by mouth daily.      . cefTRIAXone 2 g in dextrose 5 % 50 mL Inject 2 g into the vein daily.  2 g  0  . levothyroxine (SYNTHROID, LEVOTHROID) 125 MCG tablet Take 1 tablet (125 mcg total) by mouth daily before breakfast.  30 tablet  0  . Rivaroxaban (XARELTO STARTER PACK) 15 & 20 MG TBPK Take as directed on package: Start with one $Remove'15mg'pocWZWk$  tablet by mouth twice a day with food. On Day 22, switch to one $Remo'20mg'uSVve$  tablet once a day with food.  51 each  0  . Vitamin D, Ergocalciferol, (DRISDOL) 50000 UNITS CAPS capsule Take 50,000 Units by mouth every 7 (seven) days. Wednesday       No current facility-administered medications for this visit.    Past Medical History  Diagnosis Date  . Hypertension   . Thyroid disease   . Hypothyroidism   . Arthritis     "knees" (02/13/2014)    Past Surgical History  Procedure Laterality Date  . Tonsillectomy    . Appendectomy    . Tee without cardioversion [name] 02/16/2014    Procedure: TRANSESOPHAGEAL ECHOCARDIOGRAM (TEE);  Surgeon: [name], MD;  Location: [facility] ENDOSCOPY;  Service: Cardiovascular;  Laterality: [name];    No family history on file.  History   Social History  . Marital Status: Married    Spouse Name: [name]    Number of Children: [name]  . Years of Education: [name]   Occupational History  . Not on file.   Social History Main Topics  . Smoking status: Never Smoker   . Smokeless tobacco: Never Used  . Alcohol Use: 1.2 oz/week    2 Cans of beer per week  . Drug Use: No  . Sexual Activity: Yes    Other Topics Concern  . Not on file   Social History Narrative  . No narrative on file    Review of Systems:  All systems reviewed.  They are negative to the above problem except as previously stated.  Vital Signs: BP 166/85  Pulse 69  Ht $R'6\' 1"'hP$  (1.854 m)  Wt 194 lb (87.998 kg)  BMI 25.60 kg/m2  Physical Exam patinet is in NAD HEENT:  Normocephalic, atraumatic. EOMI, PERRLA.  Neck: JVP is normal.  No bruits.  Lungs: clear to auscultation. No rales no wheezes.  Heart: Regular rate and rhythm. Normal S1, S2. No S3.   No significant murmurs. PMI not displaced.  Abdomen:  Supple, nontender. Normal bowel sounds. No masses. No hepatomegaly.  Extremities:   Good distal pulses throughout. No lower extremity edema.  Musculoskeletal :moving all extremities.  Neuro:   alert and oriented x3.  CN II-XII grossly intact.   Assessment and Plan:  1.  PV endocarditis.  Now 6 wks of ABX  He should have another TEE  Will discuss with Dr Johnnye Sima timing Will check CBC, ESR  3.  PE  Check CBC  Continue Xarelto  3.  HTN  I am reluctant to increase meds now that he is abou to come off of ABX   Takes amlodipine at night  Says if takes in AM he is zoned out.  COntinue to follow.    4.  Hypothyroidism  Check TSH   Was high in hospital.

## 2014-03-29 NOTE — Telephone Encounter (Signed)
Vinnie Level, RN with [facility] called for a dressing change order for patient's picc line and lab draw on 04/02/14. First appointment with Dr. Johnnye Sima on 04/02/14, please advise. Myrtis Hopping

## 2014-03-30 ENCOUNTER — Encounter: Payer: Self-pay | Admitting: Internal Medicine

## 2014-03-30 ENCOUNTER — Ambulatory Visit (INDEPENDENT_AMBULATORY_CARE_PROVIDER_SITE_OTHER): Payer: 59 | Admitting: Internal Medicine

## 2014-03-30 VITALS — BP 166/85 | HR 69 | Ht 73.0 in | Wt 194.0 lb

## 2014-03-30 DIAGNOSIS — I319 Disease of pericardium, unspecified: Secondary | ICD-10-CM

## 2014-03-30 DIAGNOSIS — I10 Essential (primary) hypertension: Secondary | ICD-10-CM

## 2014-03-30 LAB — CBC
HEMATOCRIT: 37.7 % — AB (ref 39.0–52.0)
Hemoglobin: 12.6 g/dL — ABNORMAL LOW (ref 13.0–17.0)
MCHC: 33.5 g/dL (ref 30.0–36.0)
MCV: 87.6 fl (ref 78.0–100.0)
PLATELETS: 212 10*3/uL (ref 150.0–400.0)
RBC: 4.31 Mil/uL (ref 4.22–5.81)
RDW: 15.2 % (ref 11.5–15.5)
WBC: 3.8 10*3/uL — ABNORMAL LOW (ref 4.0–10.5)

## 2014-03-30 LAB — BASIC METABOLIC PANEL
BUN: 13 mg/dL (ref 6–23)
CHLORIDE: 105 meq/L (ref 96–112)
CO2: 28 meq/L (ref 19–32)
CREATININE: 0.9 mg/dL (ref 0.4–1.5)
Calcium: 9.4 mg/dL (ref 8.4–10.5)
GFR: 110.41 mL/min (ref >60.00)
Glucose, Bld: 105 mg/dL — ABNORMAL HIGH (ref 70–99)
Potassium: 3.7 mEq/L (ref 3.5–5.1)
Sodium: 138 mEq/L (ref 135–145)

## 2014-03-30 LAB — TSH: TSH: 1.15 u[IU]/mL (ref 0.35–4.50)

## 2014-03-30 LAB — SEDIMENTATION RATE: Sed Rate: 20 mm/hr (ref 0–22)

## 2014-03-30 NOTE — Telephone Encounter (Signed)
Please change dressing, will do labs in clinic thanks

## 2014-03-30 NOTE — Patient Instructions (Signed)
Your physician recommends that you have lab work today: CBC, TSH, ESR and BMP  Your physician recommends that you continue on your current medications as directed. Please refer to the Current Medication list given to you today.  Dr Ross will determine when she would like to follow-up with you again after speaking with Dr Hatcher.   

## 2014-04-02 ENCOUNTER — Ambulatory Visit (INDEPENDENT_AMBULATORY_CARE_PROVIDER_SITE_OTHER): Payer: 59 | Admitting: Infectious Diseases

## 2014-04-02 ENCOUNTER — Inpatient Hospital Stay: Payer: 59 | Admitting: Infectious Diseases

## 2014-04-02 ENCOUNTER — Encounter: Payer: Self-pay | Admitting: Infectious Diseases

## 2014-04-02 VITALS — BP 166/85 | HR 75 | Temp 98.5°F | Ht 73.0 in | Wt 198.0 lb

## 2014-04-02 DIAGNOSIS — I379 Nonrheumatic pulmonary valve disorder, unspecified: Secondary | ICD-10-CM

## 2014-04-02 DIAGNOSIS — R7309 Other abnormal glucose: Secondary | ICD-10-CM

## 2014-04-02 DIAGNOSIS — R229 Localized swelling, mass and lump, unspecified: Secondary | ICD-10-CM

## 2014-04-02 DIAGNOSIS — R739 Hyperglycemia, unspecified: Secondary | ICD-10-CM

## 2014-04-02 NOTE — Assessment & Plan Note (Signed)
Will defer eval of this to his PCP (was noted on home health lab from last week)

## 2014-04-02 NOTE — Progress Notes (Signed)
   Subjective:    Patient ID: Jeffrey Guerrero, male    DOB: Nov 01, 1950, 63 y.o.   MRN: [id number]  HPI 63 yo M adm on 02-12-14 to [facility] with sepsis. He was found to have pneumococcal bacteremia and pulmonic valve IE. He also had septic pulomonary emboli. He was treated with ceftriaxone 2g q24h and d/c home on 02-19-14 (end date 03-26-14). He has had no problems with his PIC since replacement ~ 3 weeks ago.  No f/c.    Review of Systems  Constitutional: Negative for fever, chills, appetite change and unexpected weight change.  Respiratory: Negative for cough and shortness of breath.   Cardiovascular: Negative for leg swelling.  Gastrointestinal: Negative for diarrhea and constipation.  Genitourinary: Negative for difficulty urinating.  Neurological: Negative for headaches.       Objective:   Physical Exam  Constitutional: He appears well-developed and well-nourished.  HENT:  Mouth/Throat: No oropharyngeal exudate.  Eyes: EOM are normal. Pupils are equal, round, and reactive to light.  Neck: Neck supple.  Cardiovascular: Normal rate, regular rhythm and normal heart sounds.   Pulmonary/Chest: Effort normal and breath sounds normal.  Abdominal: Soft. Bowel sounds are normal. He exhibits no distension. There is no tenderness.  Musculoskeletal: He exhibits no edema.  Lymphadenopathy:    He has no cervical adenopathy.       Assessment & Plan:

## 2014-04-02 NOTE — Assessment & Plan Note (Signed)
These are noted on his forearm. Advised him to f/u with his PCP, derm for consideration of biopsy.

## 2014-04-02 NOTE — Assessment & Plan Note (Signed)
He has received 6 weeks of ceftriaxone. I would agree with Dr [name] that a repeat TEE would be prudent. Will repeat his BCx today. If his TEE is positive, would consider giving him prolonged po levaquin. Will see him back at the request of Dr [name], after TEE.

## 2014-04-02 NOTE — Progress Notes (Signed)
Per Dr Johnnye Sima  47 cm  Double  lumen Peripherally Inserted Central Catheter  removed from right basilic . Two  sutures present. Dressing was clean and dry . Sutures removed, area cleansed with chlorhexidine and petroleum dressing applied. Pt advised no heavy lifting with this arm, leave dressing for 24 hours and call the office if dressing becomes soaked with blood or sharp pain presents.  Pt tolerated procedure well.    Laverle Patter, RN

## 2014-04-05 NOTE — Progress Notes (Signed)
Patient should be set up for repeat TEE

## 2014-04-06 ENCOUNTER — Encounter: Payer: Self-pay | Admitting: Internal Medicine

## 2014-04-06 ENCOUNTER — Telehealth: Payer: Self-pay | Admitting: *Deleted

## 2014-04-06 NOTE — Telephone Encounter (Signed)
[name], MD at 04/05/2014              Patient should be set up for repeat TEE    Spoke with pt, Aware of dr [name] recommendation  Patient scheduled for TEE with dr [name] 04-12-14 @ 8 am Instructions discussed with patient over the phone.

## 2014-04-06 NOTE — Telephone Encounter (Signed)
New Message  Pt called to discuss upcoming endoscopy appt

## 2014-04-08 ENCOUNTER — Other Ambulatory Visit: Payer: Self-pay | Admitting: Internal Medicine

## 2014-04-08 DIAGNOSIS — I330 Acute and subacute infective endocarditis: Secondary | ICD-10-CM

## 2014-04-08 LAB — CULTURE, BLOOD (SINGLE)
Organism ID, Bacteria: NO GROWTH
Organism ID, Bacteria: NO GROWTH

## 2014-04-09 NOTE — Telephone Encounter (Signed)
This encounter was created in error - please disregard.

## 2014-04-12 ENCOUNTER — Encounter (HOSPITAL_COMMUNITY): Payer: Self-pay | Admitting: *Deleted

## 2014-04-12 ENCOUNTER — Ambulatory Visit (HOSPITAL_COMMUNITY)
Admission: RE | Admit: 2014-04-12 | Discharge: 2014-04-12 | Disposition: A | Discharge status: Home / Self Care | Payer: 59 | Source: Ambulatory Visit | Attending: Internal Medicine | Admitting: Internal Medicine

## 2014-04-12 ENCOUNTER — Encounter (HOSPITAL_COMMUNITY)
Admission: RE | Disposition: A | Discharge status: Home / Self Care | Payer: Self-pay | Source: Ambulatory Visit | Attending: Internal Medicine

## 2014-04-12 DIAGNOSIS — I339 Acute and subacute endocarditis, unspecified: Secondary | ICD-10-CM

## 2014-04-12 DIAGNOSIS — I10 Essential (primary) hypertension: Secondary | ICD-10-CM

## 2014-04-12 DIAGNOSIS — I2699 Other pulmonary embolism without acute cor pulmonale: Secondary | ICD-10-CM

## 2014-04-12 DIAGNOSIS — I330 Acute and subacute infective endocarditis: Secondary | ICD-10-CM

## 2014-04-12 HISTORY — PX: TEE WITHOUT CARDIOVERSION: SHX5443

## 2014-04-12 SURGERY — ECHOCARDIOGRAM, TRANSESOPHAGEAL
Anesthesia: Moderate Sedation

## 2014-04-12 MED ORDER — MIDAZOLAM HCL 10 MG/2ML IJ SOLN
INTRAMUSCULAR | Status: DC | PRN
  Administered 2014-04-12 (×2): 2 mg via INTRAVENOUS

## 2014-04-12 MED ORDER — MIDAZOLAM HCL 5 MG/ML IJ SOLN
INTRAMUSCULAR | Status: AC
  Filled 2014-04-12: qty 2

## 2014-04-12 MED ORDER — FENTANYL CITRATE 0.05 MG/ML IJ SOLN
INTRAMUSCULAR | Status: AC
  Filled 2014-04-12: qty 2

## 2014-04-12 MED ORDER — LIDOCAINE VISCOUS 2 % MT SOLN
OROMUCOSAL | Status: DC | PRN
  Administered 2014-04-12: 7.5 mL via OROMUCOSAL

## 2014-04-12 MED ORDER — SODIUM CHLORIDE 0.9 % IV SOLN
INTRAVENOUS | Status: DC
  Administered 2014-04-12: 07:00:00 via INTRAVENOUS

## 2014-04-12 MED ORDER — LIDOCAINE VISCOUS 2 % MT SOLN
OROMUCOSAL | Status: AC
  Filled 2014-04-12: qty 15

## 2014-04-12 MED ORDER — FENTANYL CITRATE 0.05 MG/ML IJ SOLN
INTRAMUSCULAR | Status: DC | PRN
  Administered 2014-04-12 (×2): 25 ug via INTRAVENOUS

## 2014-04-12 NOTE — Discharge Instructions (Addendum)
Conscious Sedation °Sedation is the use of medicines to promote relaxation and relieve discomfort and anxiety. Conscious sedation is a type of sedation. Under conscious sedation you are less alert than normal but are still able to respond to instructions or stimulation. Conscious sedation is used during short medical and dental procedures. It is milder than deep sedation or general anesthesia and allows you to return to your regular activities sooner.  °LET YOUR HEALTH CARE PROVIDER KNOW ABOUT:  °· Any allergies you have. °· All medicines you are taking, including vitamins, herbs, eye drops, creams, and over-the-counter medicines. °· Use of steroids (by mouth or creams). °· Previous problems you or members of your family have had with the use of anesthetics. °· Any blood disorders you have. °· Previous surgeries you have had. °· Medical conditions you have. °· Possibility of pregnancy, if this applies. °· Use of cigarettes, alcohol, or illegal drugs. °RISKS AND COMPLICATIONS °Generally, this is a safe procedure. However, as with any procedure, problems can occur. Possible problems include: °· Oversedation. °· Trouble breathing on your own. You may need to have a breathing tube until you are awake and breathing on your own. °· Allergic reaction to any of the medicines used for the procedure. °BEFORE THE PROCEDURE °· You may have blood tests done. These tests can help show how well your kidneys and liver are working. They can also show how well your blood clots. °· A physical exam will be done.   °· Only take medicines as directed by your health care provider. You may need to stop taking medicines (such as blood thinners, aspirin, or nonsteroidal anti-inflammatory drugs) before the procedure.   °· Do not eat or drink at least 6 hours before the procedure or as directed by your health care provider. °· Arrange for a responsible adult, family member, or friend to take you home after the procedure. He or she should stay  with you for at least 24 hours after the procedure, until the medicine has worn off. °PROCEDURE  °· An intravenous (IV) catheter will be inserted into one of your veins. Medicine will be able to flow directly into your body through this catheter. You may be given medicine through this tube to help prevent pain and help you relax. °· The medical or dental procedure will be done. °AFTER THE PROCEDURE °· You will stay in a recovery area until the medicine has worn off. Your blood pressure and pulse will be checked.   °·  Depending on the procedure you had, you may be allowed to go home when you can tolerate liquids and your pain is under control. °Document Released: 06/30/2001 Document Revised: 10/10/2013 Document Reviewed: 06/12/2013 °Exit[employer]® Patient Information ©2015 [employer]. This information is not intended to replace advice given to you by your health care provider. Make sure you discuss any questions you have with your health care provider. ° °

## 2014-04-12 NOTE — H&P (View-Only) (Signed)
Patient should be set up for repeat TEE

## 2014-04-12 NOTE — Interval H&P Note (Signed)
History and Physical Interval Note:  04/12/2014 8:08 AM  Jeffrey Guerrero  has presented today for surgery, with the diagnosis of pulmonic valve disease  The various methods of treatment have been discussed with the patient and family. After consideration of risks, benefits and other options for treatment, the patient has consented to  Procedure(s): TRANSESOPHAGEAL ECHOCARDIOGRAM (TEE) ([name]) as a surgical intervention .  The patient's history has been reviewed, patient examined, no change in status, stable for surgery.  I have reviewed the patient's chart and labs.  Questions were answered to the patient's satisfaction.     Dorris Carnes

## 2014-04-12 NOTE — Op Note (Signed)
Full report to follow in echo report of [facility] imaging.

## 2014-04-13 ENCOUNTER — Encounter (HOSPITAL_COMMUNITY): Payer: Self-pay | Admitting: Internal Medicine

## 2014-04-16 ENCOUNTER — Encounter: Payer: Self-pay | Admitting: Infectious Diseases

## 2014-04-19 ENCOUNTER — Telehealth: Payer: Self-pay | Admitting: *Deleted

## 2014-04-19 DIAGNOSIS — I2699 Other pulmonary embolism without acute cor pulmonale: Secondary | ICD-10-CM

## 2014-04-19 NOTE — Telephone Encounter (Signed)
Message from Dr. [name]:     "Please set patient up for CT of chest to reevaluate Pulmonary emboli. "         Left message on home answering machine to call back so I could make pt aware that Dr. [name] wants him to have a chest CTA to reevaluated the pulmonary emboli. Order placed at this time.

## 2014-04-24 ENCOUNTER — Encounter: Payer: Self-pay | Admitting: Infectious Diseases

## 2014-04-27 ENCOUNTER — Ambulatory Visit (INDEPENDENT_AMBULATORY_CARE_PROVIDER_SITE_OTHER)
Admission: RE | Admit: 2014-04-27 | Discharge: 2014-04-27 | Disposition: A | Discharge status: Home / Self Care | Payer: 59 | Source: Ambulatory Visit | Attending: Internal Medicine | Admitting: Internal Medicine

## 2014-04-27 DIAGNOSIS — I2699 Other pulmonary embolism without acute cor pulmonale: Secondary | ICD-10-CM

## 2014-04-27 MED ORDER — IOHEXOL 350 MG/ML SOLN
80.0000 mL | Freq: Once | INTRAVENOUS | Status: AC | PRN
  Administered 2014-04-27: 80 mL via INTRAVENOUS

## 2014-05-15 ENCOUNTER — Telehealth: Payer: Self-pay | Admitting: *Deleted

## 2014-05-15 NOTE — Telephone Encounter (Signed)
lmtcb to schedule f/u appointment for Aug 3 per Dr. [name] notes on CT result note 05/14/14.

## 2014-05-15 NOTE — Telephone Encounter (Signed)
Will forward to Dr. [name]. She may want to discuss results with patient by phone. She will be in clinic on Friday, 8/28.   We can schedule for that day if ok with Dr. [name].

## 2014-05-15 NOTE — Telephone Encounter (Signed)
Patient called back. Advised him that Dr. [name] wants him to be seen in OV to discuss CT results. Patient states he can come in on any Friday. Routed to Rodman Key, RN

## 2014-05-17 NOTE — Telephone Encounter (Signed)
Spoke with patient.  Made appt w/ Dr. [name] for 8/28 (a Friday that pt is able to come)  Advised that Dr. [name] will be back next week and may be able to discuss results of CT via phone since patient cannot come for an appointment until end of month. He verbalizes understanding and agreement.

## 2014-05-17 NOTE — Telephone Encounter (Signed)
Follow Up  Pt requests a call back to discuss results. Please call

## 2014-05-22 ENCOUNTER — Telehealth: Payer: Self-pay | Admitting: Internal Medicine

## 2014-05-22 NOTE — Telephone Encounter (Signed)
Pt is aware of CT scan results.

## 2014-05-22 NOTE — Telephone Encounter (Signed)
New message ° ° ° ° °Want CT scan results °

## 2014-05-22 NOTE — Telephone Encounter (Signed)
Ct scan results given. Pt and wife would like to know if pt still needs to come for F/U visit on 06/15/14.

## 2014-05-25 NOTE — Telephone Encounter (Signed)
I spoke with pt & he will keep his 06/15/14 appointment with Dr. Gean Birchwood RN

## 2014-05-25 NOTE — Telephone Encounter (Signed)
Yes, I would like to see in clinic to discuss TEE further, see how he is feeling.

## 2014-06-12 ENCOUNTER — Other Ambulatory Visit: Payer: Self-pay

## 2014-06-12 DIAGNOSIS — E039 Hypothyroidism, unspecified: Secondary | ICD-10-CM | POA: Insufficient documentation

## 2014-06-15 ENCOUNTER — Ambulatory Visit (INDEPENDENT_AMBULATORY_CARE_PROVIDER_SITE_OTHER): Payer: 59 | Admitting: Internal Medicine

## 2014-06-15 VITALS — BP 156/94 | HR 61 | Ht 73.0 in | Wt 196.0 lb

## 2014-06-15 DIAGNOSIS — I379 Nonrheumatic pulmonary valve disorder, unspecified: Secondary | ICD-10-CM

## 2014-06-15 DIAGNOSIS — I10 Essential (primary) hypertension: Secondary | ICD-10-CM

## 2014-06-15 DIAGNOSIS — Z79899 Other long term (current) drug therapy: Secondary | ICD-10-CM

## 2014-06-15 DIAGNOSIS — I371 Nonrheumatic pulmonary valve insufficiency: Secondary | ICD-10-CM

## 2014-06-15 LAB — CBC
HEMATOCRIT: 40.2 % (ref 39.0–52.0)
HEMOGLOBIN: 13.9 g/dL (ref 13.0–17.0)
MCH: 29.1 pg (ref 26.0–34.0)
MCHC: 34.6 g/dL (ref 30.0–36.0)
MCV: 84.3 fL (ref 78.0–100.0)
Platelets: 214 10*3/uL (ref 150–400)
RBC: 4.77 MIL/uL (ref 4.22–5.81)
RDW: 15.5 % (ref 11.5–15.5)
WBC: 3.8 10*3/uL — AB (ref 4.0–10.5)

## 2014-06-15 LAB — BASIC METABOLIC PANEL
BUN: 18 mg/dL (ref 6–23)
CO2: 27 meq/L (ref 19–32)
Calcium: 9.5 mg/dL (ref 8.4–10.5)
Chloride: 106 mEq/L (ref 96–112)
Creat: 0.87 mg/dL (ref 0.50–1.35)
GLUCOSE: 94 mg/dL (ref 70–99)
POTASSIUM: 3.7 meq/L (ref 3.5–5.3)
Sodium: 141 mEq/L (ref 135–145)

## 2014-06-15 MED ORDER — LOSARTAN POTASSIUM 50 MG PO TABS: 50.0000 mg | ORAL_TABLET | Freq: Every day | ORAL | Status: DC

## 2014-06-15 NOTE — Patient Instructions (Addendum)
Your physician has recommended you make the following change in your medication:  1.) stop amlodipine 2.) begin cozaar 50 mg once daily 3.) stop xarelto (rivaroxaban)  You have been referred to Dr. Roxy Manns with cardiothoracic surgery for evaluation.  We will call you with your appointment information.  Your physician recommends that you return for lab work today (CBC, BMET)

## 2014-06-15 NOTE — Progress Notes (Signed)
HPI Patient is a 63 yo who was hospitalized earlier this spring with Sepsis.  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  Also pulmonray emboli.   He completed a course of IV Abx.  Was seen by Dr Johnnye Sima in July  Felt to be successfully treated.  Now has been off of abx  Denies F/C.   Denies SOB  No CP  No dizziness.  Appetite is OK  No PND    No Known Allergies  Current Outpatient Prescriptions  Medication Sig Dispense Refill  . amLODipine (NORVASC) 5 MG tablet Take 5 mg by mouth.      Marland Kitchen aspirin EC 81 MG tablet Take 81 mg by mouth daily.      Marland Kitchen levothyroxine (SYNTHROID, LEVOTHROID) 125 MCG tablet Take 1 tablet (125 mcg total) by mouth daily before breakfast.  30 tablet  0  . Rivaroxaban (XARELTO STARTER PACK) 15 & 20 MG TBPK Take as directed on package: Start with one 15mg  tablet by mouth twice a day with food. On Day 22, switch to one 20mg  tablet once a day with food.  51 each  0   No current facility-administered medications for this visit.    Past Medical History  Diagnosis Date  . Hypertension   . Thyroid disease   . Hypothyroidism   . Arthritis     "knees" (02/13/2014)    Past Surgical History  Procedure Laterality Date  . Tonsillectomy    . Appendectomy    . Tee without cardioversion [name] 02/16/2014    Procedure: TRANSESOPHAGEAL ECHOCARDIOGRAM (TEE);  Surgeon: [name], MD;  Location: [facility] ENDOSCOPY;  Service: Cardiovascular;  Laterality: [name];  . Tee without cardioversion [name] 04/12/2014    Procedure: TRANSESOPHAGEAL ECHOCARDIOGRAM (TEE);  Surgeon: [name], MD;  Location: [facility] ENDOSCOPY;  Service: Cardiovascular;  Laterality: [name];    No family history on file.  History   Social History  . Marital Status: Married    Spouse Name: [name]    Number of Children: [name]  . Years of Education: [name]   Occupational History  . Not on file.   Social History Main Topics  . Smoking status: Never Smoker   . Smokeless tobacco: Never Used  . Alcohol Use: 1.2 oz/week    2 Cans of  beer per week     Comment: Not drinking while on Xarelto  . Drug Use: No  . Sexual Activity: Yes   Other Topics Concern  . Not on file   Social History Narrative  . No narrative on file    Review of Systems:  All systems reviewed.  They are negative to the above problem except as previously stated.  Vital Signs: BP 156/94  Pulse 61  Ht 6\' 1"  (1.854 m)  Wt 196 lb (88.905 kg)  BMI 25.86 kg/m2  Physical Exam patinet is in NAD HEENT:  Normocephalic, atraumatic. EOMI, PERRLA.  Neck: JVP is normal.  No bruits.  Lungs: clear to auscultation. No rales no wheezes.  Heart: Regular rate and rhythm. Normal S1, S2. No S3  Gr II/VI systolic murmur LSB  Gr I/VI diastolic murmur. PMI not displaced.  Abdomen:  Supple, nontender. Normal bowel sounds. No masses. No hepatomegaly.  Extremities:   Good distal pulses throughout. No lower extremity edema.  Musculoskeletal :moving all extremities.  Neuro:   alert and oriented x3.  CN II-XII grossly intact.  EKG  SR 61  Incomplete RBBB   Assessment and Plan:  1.  PV endocarditis. Finished antibiotics earlier  this summber   Appears to have been free of residual infection.  Unfortunately valve was severely damaged.  Now with severe PI I have reviewed physiology with patinet and wife.  He denies  symptoms but RV is dilated with some dysfunction on TEE (may have started with PE but now decreased reserve)  I would recomm that he see Remus Loffler regarding surgical correction.   3.  PE  CT has shown resolution of PE  He has completed 4 months of anticoagulation.  I would stop Xarelto  3.  HTN  BP is up  I would hold on changes now.  Follow   4.  Hypothyroidism  Check TSH   Was high in hospital.

## 2014-06-18 ENCOUNTER — Institutional Professional Consult (permissible substitution) (INDEPENDENT_AMBULATORY_CARE_PROVIDER_SITE_OTHER): Payer: 59 | Admitting: Thoracic Surgery (Cardiothoracic Vascular Surgery)

## 2014-06-18 ENCOUNTER — Other Ambulatory Visit: Payer: Self-pay | Admitting: *Deleted

## 2014-06-18 ENCOUNTER — Encounter: Payer: Self-pay | Admitting: Thoracic Surgery (Cardiothoracic Vascular Surgery)

## 2014-06-18 VITALS — BP 154/84 | HR 59 | Resp 16

## 2014-06-18 DIAGNOSIS — I330 Acute and subacute infective endocarditis: Secondary | ICD-10-CM

## 2014-06-18 DIAGNOSIS — I371 Nonrheumatic pulmonary valve insufficiency: Secondary | ICD-10-CM

## 2014-06-18 DIAGNOSIS — Z8679 Personal history of other diseases of the circulatory system: Secondary | ICD-10-CM

## 2014-06-18 DIAGNOSIS — I379 Nonrheumatic pulmonary valve disorder, unspecified: Secondary | ICD-10-CM

## 2014-06-18 NOTE — Progress Notes (Signed)
[address]       Jeffrey Guerrero [address]             [phone]     CARDIOTHORACIC SURGERY CONSULTATION REPORT  Referring Provider is [name], MD PCP is [name], MD  Chief Complaint  Patient presents with  . Cardiac Valve Problem    severe pulmonic insufficiency    HPI:  Patient is a 63 year old previously healthy African American male who presented acutely this past spring on 02/12/2014 in septic shock after he suffered a syncopal episode of home with fevers greater than 103F.  Blood cultures obtained at the time of admission grew Streptococcus pneumoniae.  Transesophageal echocardiogram demonstrated the presence of vegetations on the pulmonic valve with at least moderate pulmonic insufficiency and CT angiogram of the chest revealed multiple pulmonary emboli consistent with septic emboli in both lungs.  The patient was treated with a six-week course of intravenous Rocephin and anticoagulated using Xarelto. Followup blood cultures obtained after stopping antibiotics were negative.  The patient has been followed carefully since that time by Dr. [name] who performed repeat transesophageal echocardiogram in June. At that time there was no sign of any residual vegetation on the pulmonic valve, but there was severe pulmonic insufficiency and mild to moderate right ventricular dysfunction. No other significant abnormalities were noted.  The patient has now been referred to discuss treatment options for management of severe pulmonic valve insufficiency.  The patient is married and lives with his wife in [place]. He works full-time as a Building control surveyor. This requires fairly strenuous physical activity.  He states that it took approximately 2-3 months for him to fully recover from his illness. He states that he is now back to baseline and free of any symptoms. He specifically denies any symptoms of exertional shortness of breath, fevers, chills, or chest discomfort.  His  review of systems is otherwise unremarkable.    Past Medical History  Diagnosis Date  . Hypertension   . Thyroid disease   . Hypothyroidism   . Arthritis     "knees" (02/13/2014)  . 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 Pulmonic valve insufficiency 02/16/2014    severe    Past Surgical History  Procedure Laterality Date  . Tonsillectomy    . Appendectomy    . Tee without cardioversion [name] 02/16/2014    Procedure: TRANSESOPHAGEAL ECHOCARDIOGRAM (TEE);  Surgeon: [name], MD;  Location: [facility] ENDOSCOPY;  Service: Cardiovascular;  Laterality: [name];  . Tee without cardioversion [name] 04/12/2014    Procedure: TRANSESOPHAGEAL ECHOCARDIOGRAM (TEE);  Surgeon: [name], MD;  Location: [facility] ENDOSCOPY;  Service: Cardiovascular;  Laterality: [name];    No family history on file.  History   Social History  . Marital Status: Married    Spouse Name: [name]    Number of Children: [name]  . Years of Education: [name]   Occupational History  . Not on file.   Social History Main Topics  . Smoking status: Never Smoker   . Smokeless tobacco: Never Used  . Alcohol Use: 1.2 oz/week    2 Cans of beer per week     Comment: Not drinking while on Xarelto  . Drug Use: No  . Sexual Activity: Yes   Other Topics Concern  . Not on file   Social History  Narrative  . No narrative on file    Current Outpatient Prescriptions  Medication Sig Dispense Refill  . aspirin EC 81 MG tablet Take 81 mg by mouth daily.      Marland Kitchen levothyroxine (SYNTHROID, LEVOTHROID) 125 MCG tablet Take 1 tablet (125 mcg total) by mouth daily before breakfast.  30 tablet  0  . losartan (COZAAR) 50 MG tablet Take 1 tablet (50 mg total) by mouth daily.  90 tablet  3   No current facility-administered medications for this visit.    No Known  Allergies    Review of Systems:   General:  normal appetite, normal energy, no weight gain, no weight loss, no fever  Cardiac:  no chest pain with exertion, no chest pain at rest, no SOB with exertion, no resting SOB, no PND, no orthopnea, no palpitations, no arrhythmia, no atrial fibrillation, no LE edema, no dizzy spells, no syncope  Respiratory:  no shortness of breath, no home oxygen, no productive cough, no dry cough, no bronchitis, no wheezing, no hemoptysis, no asthma, no pain with inspiration or cough, no sleep apnea, no CPAP at night  GI:   no difficulty swallowing, no reflux, no frequent heartburn, no hiatal hernia, no abdominal pain, no constipation, no diarrhea, no hematochezia, no hematemesis, no melena  GU:   no dysuria,  no frequency, no urinary tract infection, no hematuria, no enlarged prostate, no kidney stones, no kidney disease  Vascular:  no pain suggestive of claudication, no pain in feet, no leg cramps, no varicose veins, no DVT, no non-healing foot ulcer  Neuro:   no stroke, no TIA's, no seizures, no headaches, no temporary blindness one eye,  no slurred speech, no peripheral neuropathy, + occasional transient numbness in thumb and long finger right hand since PICC line, no chronic pain, no instability of gait, no memory/cognitive dysfunction  Musculoskeletal: + arthritis in both knees, no joint swelling, no myalgias, no difficulty walking, normal mobility   Skin:   no rash, no itching, no skin infections, no pressure sores or ulcerations  Psych:   no anxiety, no depression, no nervousness, no unusual recent stress  Eyes:   no blurry vision, no floaters, no recent vision changes, + wears glasses or contacts  ENT:   no hearing loss, no loose or painful teeth, edentulous but no dentures, last saw dentist many years ago  Hematologic:  no easy bruising, no abnormal bleeding, no clotting disorder, no frequent epistaxis  Endocrine:  no diabetes, does not check CBG's at  home     Physical Exam:   BP 154/84  Pulse 59  Resp 16  SpO2 98%  General:    well-appearing  HEENT:  Unremarkable   Neck:   no JVD, no bruits, no adenopathy   Chest:   clear to auscultation, symmetrical breath sounds, no wheezes, no rhonchi   CV:   RRR, to and frow grade III/VI diatolic murmur best LUSB  Abdomen:  soft, non-tender, no masses   Extremities:  warm, well-perfused, pulses , no LE edema  Rectal/GU  Deferred  Neuro:   Grossly non-focal and symmetrical throughout  Skin:   Clean and dry, no rashes, no breakdown   Diagnostic Tests:  Transthoracic Echocardiography  Patient: Jeffrey Guerrero, Jeffrey Guerrero MR #: [id number] Study Date: 02/13/2014 Gender: M Age: 60 Height: 185.4cm Weight: 87.1kg BSA: 2.70m^2 Pt. Status: Room: 2M13C  ADMITTING Ramaswamy, Murali ATTENDING Ramaswamy, Murali ORDERING Chase Caller, Murali REFERRING Chase Caller, [place] SONOGRAPHER Donata Clay PERFORMING [facility], Inpatient cc:  ------------------------------------------------------------ LV EF:  45% - 50%  ------------------------------------------------------------ Indications: CHF 428.21.  ------------------------------------------------------------ Study Conclusions  - Left ventricle: The cavity size was mildly dilated. Wall thickness was normal. Systolic function was mildly reduced. The estimated ejection fraction was in the range of 45% to 50%. Diffuse hypokinesis. - Right ventricle: The cavity size was moderately dilated. - Right atrium: The atrium was moderately dilated. - Atrial septum: No defect or patent foramen ovale was identified. Transthoracic echocardiography. M-mode, complete 2D, spectral Doppler, and color Doppler. Height: Height: 185.4cm. Height: 73in. Weight: Weight: 87.1kg. Weight: 191.6lb. Body mass index: BMI: 25.3kg/m^2. Body surface area: BSA: 2.71m^2. Blood pressure: 99/60. Patient status: Inpatient. Location:  ICU.  ------------------------------------------------------------  ------------------------------------------------------------ Left ventricle: The cavity size was mildly dilated. Wall thickness was normal. Systolic function was mildly reduced. The estimated ejection fraction was in the range of 45% to 50%. Diffuse hypokinesis.  ------------------------------------------------------------ Aortic valve: Mildly thickened leaflets. Doppler: There was no stenosis.  ------------------------------------------------------------ Mitral valve: Mildly thickened leaflets . Doppler: No significant regurgitation.  ------------------------------------------------------------ Left atrium: The atrium was normal in size.  ------------------------------------------------------------ Atrial septum: No defect or patent foramen ovale was identified.  ------------------------------------------------------------ Right ventricle: The cavity size was moderately dilated.  ------------------------------------------------------------ Pulmonic valve: Structurally normal valve. Cusp separation was normal. Doppler: Transvalvular velocity was within the normal range. Trivial regurgitation.  ------------------------------------------------------------ Tricuspid valve: Structurally normal valve. Leaflet separation was normal. Doppler: Transvalvular velocity was within the normal range. Mild regurgitation.  ------------------------------------------------------------ Right atrium: The atrium was moderately dilated.  ------------------------------------------------------------ Pericardium: The pericardium was normal in appearance.  ------------------------------------------------------------  2D measurements Normal Doppler measurements Normal Left ventricle Left ventricle LVID ED, 55.8 mm 43-52 Ea, lat ann, 8.2 cm/s ------ chord, tiss DP 2 PLAX E/Ea, lat 8.1 ------ LVID ES, 43.8 mm 23-38 ann, tiss  DP chord, Ea, med ann, 7.4 cm/s ------ PLAX tiss DP 6 FS, chord, 22 % >29 E/Ea, med 8.9 ------ PLAX ann, tiss DP 3 LVPW, ED 10.5 mm ------ Mitral valve IVS/LVPW 0.98 <1.3 Peak E vel 66. cm/s ------ ratio, ED 6 Vol ED, 164 ml ------ Peak A vel 73. cm/s ------ MOD1 1 Vol ES, 93 ml ------ Deceleration 176 ms 150-23 MOD1 time 0 EF, MOD1 43 % ------ Peak E/A 0.9 ------ Vol index, 78 ml/m^2 ------ ratio ED, MOD1 Systemic veins Vol index, 44 ml/m^2 ------ Estimated CVP 3 mm ------ ES, MOD1 Hg Vol ED, 146 ml ------ Right ventricle MOD2 Sa vel, lat 11. cm/s ------ Vol ES, 86 ml ------ ann, tiss DP 6 MOD2 EF, MOD2 41 % ------ Stroke 60 ml ------ vol, MOD2 Vol index, 69 ml/m^2 ------ ED, MOD2 Vol index, 41 ml/m^2 ------ ES, MOD2 Stroke 28.4 ml/m^2 ------ index, MOD2 Ventricular septum IVS, ED 10.3 mm ------ Aorta Root diam, 31 mm ------ ED Left atrium AP dim 35 mm ------ AP dim 1.66 cm/m^2 <2.2 index  ------------------------------------------------------------ Prepared and Electronically Authenticated by  Jenkins Rouge 2015-04-28T15:31:[id number]    Transesophageal Echocardiography  Patient:    Damoni, Causby MR #:       [id number] Study Date: 02/16/2014 Gender:     M Age:        39 Height:     185.4cm Weight:     95.5kg BSA:        2.20m^2 Pt. Status: Room:       [place]  ADMITTING    Ramaswamy, Murali  ATTENDING    Verlee Monte  SONOGRAPHER  Mauricio Po, RDCS, CCT cc:  ------------------------------------------------------------  ------------------------------------------------------------ Indications:      Endocarditis 421.9, suspected, pre-procedure.  ------------------------------------------------------------ Study Conclusions  - Pulmonic valve: PV is difficult to see. IT appears   thickened Question dysplastic Images 42 and 43 show   echodensity consistent with vegetation. Difficult to    image.   Moderate PI. - Pulmonary arteries: Large mobile mass seen in R pulmonary   artery. Cannot see attachment. It ias echolucent center,   Question necrotic ? vegetation. ? thrombus. Recomm CTA of   lungs. Hospitalist notified of findings. Transesophageal echocardiography.  2D and color Doppler. Height:  Height: 185.4cm. Height: 73in.  Weight:  Weight: 95.5kg. Weight: 210lb.  Body mass index:  BMI: 27.8kg/m^2. Body surface area:    BSA: 2.72m^2.  Blood pressure: 167/98.  Patient status:  Inpatient.  Location:  Endoscopy.   ------------------------------------------------------------  ------------------------------------------------------------ Aortic valve:  AV is mildly thickened. No AI.  ------------------------------------------------------------ Aorta:  Minimal atherosclerotic streaking in aorta.  ------------------------------------------------------------ Mitral valve:  MV is normal. Trace MR.  ------------------------------------------------------------ Left atrium:   No evidence of thrombus in the atrial cavity or appendage.  ------------------------------------------------------------ Pulmonic valve:   PV is difficult to see. IT appears thickened Question dysplastic Images 42 and 43 show echodensity consistent with vegetation. Difficult to image. Moderate PI.  ------------------------------------------------------------ Tricuspid valve:  TV is normal Trace TR>  ------------------------------------------------------------ Pulmonary artery:   Large mobile mass seen in R pulmonary artery. Cannot see attachment. It ias echolucent center, Question necrotic ? vegetation. ? thrombus. Recomm CTA of lungs. Hospitalist notified of findings.   ------------------------------------------------------------ Post procedure conclusions Ascending Aorta:  - Minimal atherosclerotic streaking in aorta.  ------------------------------------------------------------ Prepared  and Electronically Authenticated by  Dorris Carnes 2015-05-01T18:24:27.903    Transesophageal Echocardiography  (Report amended )  Patient:    Avaneesh, Pepitone MR #:       [id number] Study Date: 04/12/2014 Gender:     M Age:        58 Height:     185.4 cm Weight:     95.5 kg BSA:        2.23 m^2 Pt. Status: Room:   Daleen Squibb, M.D.  PERFORMING   Dorris Carnes, M.D.  PERFORMING   Danise Mina  SONOGRAPHER  Donata Clay  cc:  -------------------------------------------------------------------  ------------------------------------------------------------------- Indications:      Endocarditis 421.9.                   Diagnostic transesophageal echocardiography.  2D and color Doppler. Birthdate:  Patient birthdate: December 25, 1950.  Age:  Patient is 63 yr old.  Sex:  Gender: male.  Height:  Height: 185.4 cm. Height: 73 in.  Weight:  Weight: 95.5 kg. Weight: 210.1 lb.  Body mass index: BMI: 27.8 kg/m^2.  Body surface area:    BSA: 2.23 m^2.  Blood pressure:     123/78  Patient status:  Outpatient.  Study date: Study date: 04/12/2014. Study time: 07:50 AM.  Location: Endoscopy.  -------------------------------------------------------------------  ------------------------------------------------------------------- Left ventricle:  LVEF is grossly normal. LVEF is normal.  ------------------------------------------------------------------- Aortic valve:  AV is normal. No AI.  ------------------------------------------------------------------- Mitral valve:  MV is normal . Trace MR.  ------------------------------------------------------------------- Left atrium:   No evidence of thrombus in the atrial cavity or appendage.  ------------------------------------------------------------------- Right ventricle:  RV is mildly dilated. RVEF is mid to moderately depressed.  ------------------------------------------------------------------- Pulmonic valve:   PV has shaggy  appearance, appears flail. PI is severe. Compared to  previous TEE, mass is no longer seen at [facility].   ------------------------------------------------------------------- Pulmonary artery:   No masses in PA or its branches.   ------------------------------------------------------------------- Vilinda Blanks, M.D. 2015-07-15T15:29:32    Impression:  Patient has severe, asymptomatic primary pulmonic valve insufficiency which developed acutely has a complication of pulmonic valve endocarditis which the patient developed last spring.  He has now recovered completely from the endocarditis. Most recent followup transesophageal echocardiogram confirmed the presence of severe pulmonic valve insufficiency and mild to moderate right ventricular dysfunction. Options include close observation versus elective surgical replacement, with human allograft pulmonic valve replacement likely the best surgical option.   Plan:  I discussed options at length with the patient and his wife in the office today. The rationale for elective surgery as been discussed and contrasted with the natural history of severe pulmonary valve insufficiency with the potential for further deterioration right ventricular function and ultimately the potential for development of right ventricular failure. Expectations for perioperative risks and his postoperative convalescence were discussed at length.  The patient desires to hold off on proceeding with surgery any time in the immediate future. Under the circumstances I favor obtaining a repeat echocardiogram approximately 3 months from now, which will be approximately 6 months from his last previous echocardiogram. The patient will return at that time to review the results of his followup echocardiogram and to discuss matters further. All of his questions been addressed.   I spent in excess of 90 minutes during the conduct of this office consultation and >50% of this time involved  direct face-to-face encounter with the patient for counseling and/or coordination of their care.   Valentina Gu. Roxy Manns, MD 06/18/2014 2:42 PM

## 2014-08-10 ENCOUNTER — Other Ambulatory Visit: Payer: Self-pay | Admitting: *Deleted

## 2014-08-10 DIAGNOSIS — I379 Nonrheumatic pulmonary valve disorder, unspecified: Secondary | ICD-10-CM

## 2014-08-10 DIAGNOSIS — I371 Nonrheumatic pulmonary valve insufficiency: Secondary | ICD-10-CM

## 2014-09-05 ENCOUNTER — Ambulatory Visit (HOSPITAL_COMMUNITY): Payer: 59 | Attending: Thoracic Surgery (Cardiothoracic Vascular Surgery) | Admitting: Radiology

## 2014-09-05 DIAGNOSIS — I10 Essential (primary) hypertension: Secondary | ICD-10-CM | POA: Diagnosis not present

## 2014-09-05 DIAGNOSIS — I379 Nonrheumatic pulmonary valve disorder, unspecified: Secondary | ICD-10-CM

## 2014-09-05 DIAGNOSIS — I371 Nonrheumatic pulmonary valve insufficiency: Secondary | ICD-10-CM | POA: Diagnosis not present

## 2014-09-05 DIAGNOSIS — I330 Acute and subacute infective endocarditis: Secondary | ICD-10-CM | POA: Diagnosis not present

## 2014-09-05 NOTE — Progress Notes (Signed)
Echocardiogram performed.  

## 2014-09-10 ENCOUNTER — Ambulatory Visit: Payer: 59 | Admitting: Thoracic Surgery (Cardiothoracic Vascular Surgery)

## 2014-09-24 ENCOUNTER — Telehealth: Payer: Self-pay | Admitting: *Deleted

## 2014-09-24 ENCOUNTER — Ambulatory Visit (INDEPENDENT_AMBULATORY_CARE_PROVIDER_SITE_OTHER): Payer: 59 | Admitting: Thoracic Surgery (Cardiothoracic Vascular Surgery)

## 2014-09-24 ENCOUNTER — Encounter: Payer: Self-pay | Admitting: Thoracic Surgery (Cardiothoracic Vascular Surgery)

## 2014-09-24 VITALS — BP 167/96 | HR 60 | Resp 16 | Ht 73.0 in | Wt 207.0 lb

## 2014-09-24 DIAGNOSIS — I371 Nonrheumatic pulmonary valve insufficiency: Secondary | ICD-10-CM

## 2014-09-24 DIAGNOSIS — I379 Nonrheumatic pulmonary valve disorder, unspecified: Secondary | ICD-10-CM

## 2014-09-24 DIAGNOSIS — I10 Essential (primary) hypertension: Secondary | ICD-10-CM

## 2014-09-24 NOTE — Patient Instructions (Signed)
Increase amlodipine to 10 mg daily  Stop taking aspirin 2 weeks before your scheduled surgery

## 2014-09-24 NOTE — Progress Notes (Signed)
Jeffrey Guerrero       Jeffrey Guerrero,Jeffrey [address]             [phone]     CARDIOTHORACIC SURGERY OFFICE NOTE  Referring Provider is Fay Guerrero, Jeffrey Guerrero, Jeffrey Guerrero and observation. For a variety of reasons the patient wanted to hold off for a period of time, and he returns for follow-up today having undergone follow-up echocardiogram last month. He reports no new problems or complaints. He specifically denies any problems of exertional chest discomfort or shortness of breath.  Current Outpatient Prescriptions  Medication Sig Dispense Refill  . amLODipine (NORVASC) 5 MG tablet Take 5 mg by mouth daily.    Marland Kitchen aspirin EC 81 MG tablet Take 81 mg by mouth daily.    Marland Kitchen levothyroxine (SYNTHROID, LEVOTHROID) 125 MCG tablet Take 1 tablet (125 mcg total) by mouth daily before breakfast. 30 tablet 0   No current facility-administered medications for this visit.      Physical Exam:   BP 167/96 mmHg  Pulse 60  Resp 16  Ht 6\' 1"  (1.854 m)  Wt 207 lb (93.895 kg)  BMI 27.32 kg/m2  SpO2 98%  General:  Well-appearing  Chest:   Clear  CV:   Regular rate and rhythm with prominent diastolic murmur  Incisions:  n/a  Abdomen:  Soft and nontender  Extremities:  Warm and well-perfused with no lower extremity edema  Diagnostic Tests:  Transthoracic Echocardiography  Patient:  Malikai, Gut MR #:    [id number] Study Date: 09/05/2014 Gender:   M Age:    63 Height:   185.4 cm Weight:   88.9 kg BSA:    2.15 m^2 Pt. Status: Room:  SONOGRAPHER Victorio Palm, RDCS ATTENDING  Darylene Price, M.D. ORDERING   Darylene Price, M.D. REFERRING  Darylene Price, M.D. PERFORMING  [facility],  Outpatient  cc:  ------------------------------------------------------------------- LV EF: 55% -  60%  ------------------------------------------------------------------- Indications:   Bacterial endocarditis (I33.0). Pulmonary insuffiency (I37.1).  ------------------------------------------------------------------- History:  PMH: Acquired from the patient and from the patient&'s chart. 2/6 Systolic murmur and 1/6 diastolic murmur. Severe pulmonic regurgitation. Known endocarditis. The causative organism is bacterial. Pulmonary embolic disease. Risk factors: Hypertension.  ------------------------------------------------------------------- Study Conclusions  - Left ventricle: The cavity size was normal. Wall thickness was normal. Systolic function was normal. The estimated ejection fraction was in the range of 55% to 60%. Wall motion was normal; there were no regional wall motion abnormalities. - Right atrium: The atrium was moderately dilated. - Pulmonary arteries: Systolic pressure was mildly increased. PA peak pressure: 31 mm Hg (S).  ------------------------------------------------------------------- Labs, prior tests, procedures, and surgery: Echocardiography (April 2015).  The pulmonic valve showed severe regurgitation. EF was 50%. Moderate RV and RA enlargement.  Transthoracic echocardiography. M-mode, complete 2D, spectral Doppler, and color Doppler. Birthdate: Patient birthdate: Nov 23, 1950. Age: Patient is 63 yr old. Sex: Gender: male. BMI: 25.9 kg/m^2. Blood pressure:   125/75 Patient status: Outpatient. Study date: Study date: 09/05/2014. Study time: 02:23 PM. Location: [facility]  -------------------------------------------------------------------  ------------------------------------------------------------------- Left ventricle: The global longitudinal strain is -22%.  The cavity size was normal. Wall thickness  was normal. Systolic function was normal. The estimated ejection fraction was in the range of 55% to 60%.  Wall motion was normal; there were no regional wall motion abnormalities.  ------------------------------------------------------------------- Aortic valve:  Structurally normal valve.  Cusp separation was normal. Doppler: Transvalvular velocity was within the normal range. There was no stenosis. There was no regurgitation.  ------------------------------------------------------------------- Aorta: Aortic root: The aortic root was normal in size. Ascending aorta: The ascending aorta was normal in size.  ------------------------------------------------------------------- Mitral valve:  Structurally normal valve.  Leaflet separation was normal. Doppler: Transvalvular velocity was within the normal range. There was no evidence for stenosis. There was trivial regurgitation.  Peak gradient (D): 2 mm Hg.  ------------------------------------------------------------------- Left atrium: The atrium was normal in size.  ------------------------------------------------------------------- Right ventricle: The cavity size was normal. Systolic function was normal.  ------------------------------------------------------------------- Pulmonic valve:  The valve appears to be grossly normal. Doppler: There was trivial regurgitation.  ------------------------------------------------------------------- Tricuspid valve:  Structurally normal valve.  Leaflet separation was normal. Doppler: Transvalvular velocity was within the normal range. There was trivial regurgitation.  ------------------------------------------------------------------- Pulmonary artery:  Systolic pressure was mildly increased.  ------------------------------------------------------------------- Right atrium: The atrium was moderately  dilated.  ------------------------------------------------------------------- Pericardium: There was no pericardial effusion.  ------------------------------------------------------------------- Systemic veins: Inferior vena cava: The vessel was normal in size. The respirophasic diameter changes were in the normal range (>= 50%), consistent with normal central venous pressure.  ------------------------------------------------------------------- Measurements  Left ventricle               Value    Reference LV ID, ED, PLAX chordal           44.4 mm   43 - 52 LV ID, ES, PLAX chordal           29.3 mm   23 - 38 LV fx shortening, PLAX chordal       34  %   >=29 LV PW thickness, ED             11.2 mm   --------- IVS/LV PW ratio, ED             0.94     <=1.3 Stroke volume, 2D              90  ml   --------- Stroke volume/bsa, 2D            42  ml/m^2 --------- LV e&', lateral               10.7 cm/s  --------- LV E/e&', lateral              7.29     --------- LV e&', medial                8.55 cm/s  --------- LV E/e&', medial               9.12     --------- LV e&', average               9.63 cm/s  --------- LV E/e&', average              8.1     ---------  Ventricular septum             Value    Reference IVS thickness, ED              10.5 mm   ---------  LVOT                    Value    Reference LVOT ID, S  22  mm   --------- LVOT area                  3.8  cm^2  --------- LVOT ID                   22  mm   --------- LVOT peak velocity, S            107  cm/s  --------- LVOT mean velocity, S             72.8 cm/s  --------- LVOT VTI, S                 23.8 cm   --------- Stroke volume (SV), LVOT DP         90.5 ml   --------- Stroke index (SV/bsa), LVOT DP       42.1 ml/m^2 ---------  Aorta                    Value    Reference Aortic root ID, ED             35  mm   ---------  Left atrium                 Value    Reference LA ID, A-P, ES               44  mm   --------- LA ID/bsa, A-P               2.05 cm/m^2 <=2.2 LA volume, S                23  ml   --------- LA volume/bsa, S              10.7 ml/m^2 --------- LA volume, ES, 1-p A4C           25  ml   --------- LA volume/bsa, ES, 1-p A4C         11.6 ml/m^2 --------- LA volume, ES, 1-p A2C           22  ml   --------- LA volume/bsa, ES, 1-p A2C         10.2 ml/m^2 ---------  Mitral valve                Value    Reference Mitral E-wave peak velocity         78  cm/s  --------- Mitral A-wave peak velocity         81.9 cm/s  --------- Mitral deceleration time          215  ms   150 - 230 Mitral peak gradient, D           2   mm Hg --------- Mitral E/A ratio, peak           1      ---------  Pulmonary arteries             Value    Reference PA pressure, S, DP         (H)   31  mm Hg <=30  Tricuspid valve               Value    Reference Tricuspid regurg peak velocity       265  cm/s  --------- Tricuspid peak RV-RA gradient        28  mm Hg ---------  Systemic veins  Value    Reference Estimated CVP                3   mm Hg ---------  Right ventricle                Value    Reference RV pressure, S, DP         (H)   31  mm Hg <=30 RV s&', lateral, S              19.4 cm/s  --------- RV myocardial performance index,      0.45     --------- TDI  Pulmonic valve               Value    Reference Pulmonic valve peak velocity, S       148  cm/s  ---------  Legend: (L) and (H) mark values outside specified reference range.  ------------------------------------------------------------------- Prepared and Electronically Authenticated by  Mertie Moores, M.D. 2015-11-18T17:14:11   Impression:  Patient has severe, asymptomatic primary pulmonic valve insufficiency which developed acutely has a complication of pulmonic valve endocarditis which the patient developed last spring. He has now recovered completely from the endocarditis. Most recent followup transesophageal echocardiogram confirmed the presence of severe pulmonic valve insufficiency and mild to moderate right ventricular dysfunction. Options include close observation versus elective surgical replacement, with human allograft pulmonic valve replacement likely the best surgical option.  Because of his age I favor preoperative cardiac catheterization to screen for the presence of coronary artery disease.  His blood pressure seems to be running somewhat high.    Plan:  I discussed options at length with the patient and his wife in the office today. The rationale for elective surgery as been discussed and contrasted with the natural history of severe pulmonary valve insufficiency with the potential for further deterioration right ventricular function and ultimately the potential for development of right ventricular failure. Expectations for perioperative risks and his postoperative convalescence were discussed at length.  The patient desires to proceed with elective surgery in early January. We will contact Dr.  [name] to arrange for preoperative diagnostic cardiac catheterization. I have instructed the patient to increase his dose of amlodipine to 10 mg daily.  I spent in excess of 15 minutes during the conduct of this office consultation and >50% of this time involved direct face-to-face encounter with the patient for counseling and/or coordination of their care.    Valentina Gu. Roxy Manns, Jeffrey 09/24/2014 10:16 AM

## 2014-09-24 NOTE — Telephone Encounter (Signed)
Message     Spoke to [employer] Pt wants surgery in Jan    Will need cath before Needs quick appt and labs    lmtcb

## 2014-09-25 ENCOUNTER — Other Ambulatory Visit: Payer: Self-pay | Admitting: *Deleted

## 2014-09-25 DIAGNOSIS — I371 Nonrheumatic pulmonary valve insufficiency: Secondary | ICD-10-CM

## 2014-09-25 DIAGNOSIS — J984 Other disorders of lung: Secondary | ICD-10-CM

## 2014-09-26 NOTE — Telephone Encounter (Signed)
Left message for patient to call back to schedule appt with Dr. [name].

## 2014-09-27 NOTE — Telephone Encounter (Signed)
Left message for patient to call back  

## 2014-10-02 ENCOUNTER — Telehealth: Payer: Self-pay | Admitting: Internal Medicine

## 2014-10-02 NOTE — Telephone Encounter (Signed)
New Prob    Pt is requesting blood work. Orders needed in EPIC prior to scheduling. Please call.

## 2014-10-02 NOTE — Telephone Encounter (Signed)
Left message to call back  

## 2014-10-03 NOTE — Telephone Encounter (Signed)
Patient returned my call this am. Appointment made to see Dr. [name] 12/18. Advised that at that time Dr. [name] will discuss and arrange for labs/cath. Verbalizes understanding and agreement. Has heart surgery scheduled for January 14.

## 2014-10-03 NOTE — Telephone Encounter (Signed)
See previous phone note.  

## 2014-10-05 ENCOUNTER — Encounter: Payer: Self-pay | Admitting: *Deleted

## 2014-10-05 ENCOUNTER — Encounter: Payer: Self-pay | Admitting: Internal Medicine

## 2014-10-05 ENCOUNTER — Ambulatory Visit (INDEPENDENT_AMBULATORY_CARE_PROVIDER_SITE_OTHER): Payer: 59 | Admitting: Internal Medicine

## 2014-10-05 VITALS — BP 140/80 | HR 65 | Ht 73.0 in | Wt 200.0 lb

## 2014-10-05 DIAGNOSIS — Z01812 Encounter for preprocedural laboratory examination: Secondary | ICD-10-CM

## 2014-10-05 DIAGNOSIS — E039 Hypothyroidism, unspecified: Secondary | ICD-10-CM

## 2014-10-05 LAB — BASIC METABOLIC PANEL
BUN: 16 mg/dL (ref 6–23)
CO2: 25 meq/L (ref 19–32)
CREATININE: 0.93 mg/dL (ref 0.50–1.35)
Calcium: 9.3 mg/dL (ref 8.4–10.5)
Chloride: 106 mEq/L (ref 96–112)
GLUCOSE: 96 mg/dL (ref 70–99)
Potassium: 3.8 mEq/L (ref 3.5–5.3)
Sodium: 138 mEq/L (ref 135–145)

## 2014-10-05 LAB — CBC
HCT: 40.8 % (ref 39.0–52.0)
Hemoglobin: 14.5 g/dL (ref 13.0–17.0)
MCH: 30.7 pg (ref 26.0–34.0)
MCHC: 35.5 g/dL (ref 30.0–36.0)
MCV: 86.4 fL (ref 78.0–100.0)
MPV: 10.1 fL (ref 9.4–12.4)
PLATELETS: 209 10*3/uL (ref 150–400)
RBC: 4.72 MIL/uL (ref 4.22–5.81)
RDW: 14.8 % (ref 11.5–15.5)
WBC: 4 10*3/uL (ref 4.0–10.5)

## 2014-10-05 LAB — TSH: TSH: 3.971 u[IU]/mL (ref 0.350–4.500)

## 2014-10-05 NOTE — Progress Notes (Signed)
HPI Patient is a 63 yo who was hospitalized earlier this spring with Sepsis.  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  Also pulmonray emboli.   He completed a course of IV Abx.  Was seen by Dr Johnnye Sima in July  Felt to be successfully treated.  Now has been off of abx  I saw him back in August  He did not want to consider surgery at that time   He has since seen C Roxy Manns and has discussed PV replacement  Plan for surgery in Jan He denies CP  No SOB  No dizziness  No palpitations  No edema     No Known Allergies  Current Outpatient Prescriptions  Medication Sig Dispense Refill  . amLODipine (NORVASC) 5 MG tablet Take 10 mg by mouth daily.    Marland Kitchen aspirin EC 81 MG tablet Take 81 mg by mouth daily.    Marland Kitchen levothyroxine (SYNTHROID, LEVOTHROID) 125 MCG tablet Take 1 tablet (125 mcg total) by mouth daily before breakfast. 30 tablet 0  . metFORMIN (GLUCOPHAGE) 500 MG tablet Take 500 mg by mouth 2 (two) times daily.     No current facility-administered medications for this visit.    Past Medical History  Diagnosis Date  . Hypertension   . Thyroid disease   . Hypothyroidism   . Arthritis     "knees" (02/13/2014)  . 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 Pulmonic valve insufficiency 02/16/2014    severe    Past Surgical History  Procedure Laterality Date  . Tonsillectomy    . Appendectomy    . Tee without cardioversion [name] 02/16/2014    Procedure: TRANSESOPHAGEAL ECHOCARDIOGRAM (TEE);  Surgeon: [name], MD;  Location: [facility] ENDOSCOPY;  Service: Cardiovascular;  Laterality: [name];  . Tee without cardioversion [name] 04/12/2014    Procedure: TRANSESOPHAGEAL ECHOCARDIOGRAM (TEE);  Surgeon: [name], MD;  Location: [facility] ENDOSCOPY;  Service: Cardiovascular;  Laterality: [name];    No family history on file.  History    Social History  . Marital Status: Married    Spouse Name: [name]    Number of Children: [name]  . Years of Education: [name]   Occupational History  . Not on file.   Social History Main Topics  . Smoking status: Never Smoker   . Smokeless tobacco: Never Used  . Alcohol Use: 1.2 oz/week    2 Cans of beer per week     Comment: Not drinking while on Xarelto  . Drug Use: No  . Sexual Activity: Yes   Other Topics Concern  . Not on file   Social History Narrative    Review of Systems:  All systems reviewed.  They are negative to the above problem except as previously stated.  Vital Signs: BP 140/80 mmHg  Pulse 65  Ht 6\' 1"  (1.854 m)  Wt 200 lb (90.719 kg)  BMI 26.39 kg/m2  Physical Exam patinet is in NAD HEENT:  Normocephalic, atraumatic. EOMI, PERRLA.  Neck: JVP is normal.  No bruits.  Lungs: clear to auscultation. No rales no wheezes.  Heart: Regular rate and rhythm. Normal S1, S2. No S3  Gr II/VI systolic murmur LSB  Gr II-III/VI diastolic murmur. PMI not displaced.  Abdomen:  Supple, nontender. Normal bowel sounds. No masses. No hepatomegaly.  Extremities:   Good distal pulses throughout. No lower extremity edema.  Musculoskeletal :moving all extremities.  Neuro:   alert and oriented x3.  CN II-XII grossly intact.  EKG  SR 61  Incomplete RBBB   Assessment and Plan:  1.  PV endocarditis. Finished antibiotics earlier this summber   Appears to have been free of residual infection.  Unfortunately valve was severely damaged.  Now with severe PI He has discussed with surgery and plan is for this to be done on Jan 14.  Will arrange for L heart cath prior.  3.  PE  CT has shown resolution of PE  He has completed 4 month of Xarelto  Now off    3.  HTN Fair control     4.  Hypothyroidism WIll check TSH

## 2014-10-05 NOTE — Patient Instructions (Signed)
Your physician recommends that you continue on your current medications as directed. Please refer to the Current Medication list given to you today.  Your physician recommends that you return for lab work in: today (bmet, cbc, inr/pt)  Your physician has requested that you have a cardiac catheterization. Cardiac catheterization is used to diagnose and/or treat various heart conditions. Doctors may recommend this procedure for a number of different reasons. The most common reason is to evaluate chest pain. Chest pain can be a symptom of coronary artery disease (CAD), and cardiac catheterization can show whether plaque is narrowing or blocking your heart's arteries. This procedure is also used to evaluate the valves, as well as measure the blood flow and oxygen levels in different parts of your heart. For further information please visit [email or web address]. Please follow instruction sheet, as given.

## 2014-10-06 LAB — PROTIME-INR
INR: 0.94 (ref <1.50)
Prothrombin Time: 12.6 seconds (ref 11.6–15.2)

## 2014-10-08 MED ORDER — SODIUM CHLORIDE 0.9 % IV SOLN
INTRAVENOUS | Status: DC
  Administered 2014-10-09: 09:00:00 via INTRAVENOUS

## 2014-10-08 MED ORDER — SODIUM CHLORIDE 0.9 % IJ SOLN: 3.0000 mL | Freq: Two times a day (BID) | INTRAMUSCULAR | Status: DC

## 2014-10-08 MED ORDER — SODIUM CHLORIDE 0.9 % IJ SOLN
3.0000 mL | INTRAMUSCULAR | Status: DC | PRN
  Administered 2014-10-09: 3 mL via INTRAVENOUS
  Filled 2014-10-08: qty 3

## 2014-10-08 MED ORDER — ASPIRIN 81 MG PO CHEW
81.0000 mg | CHEWABLE_TABLET | ORAL | Status: AC
  Administered 2014-10-09: 81 mg via ORAL

## 2014-10-08 MED ORDER — SODIUM CHLORIDE 0.9 % IV SOLN: 250.0000 mL | INTRAVENOUS | Status: DC | PRN

## 2014-10-09 ENCOUNTER — Encounter (HOSPITAL_COMMUNITY)
Admission: RE | Disposition: A | Discharge status: Home / Self Care | Payer: Self-pay | Source: Ambulatory Visit | Attending: Interventional Cardiology

## 2014-10-09 ENCOUNTER — Encounter (HOSPITAL_COMMUNITY): Payer: Self-pay | Admitting: Interventional Cardiology

## 2014-10-09 ENCOUNTER — Ambulatory Visit (HOSPITAL_COMMUNITY)
Admission: RE | Admit: 2014-10-09 | Discharge: 2014-10-09 | Disposition: A | Discharge status: Home / Self Care | Payer: 59 | Source: Ambulatory Visit | Attending: Interventional Cardiology | Admitting: Interventional Cardiology

## 2014-10-09 DIAGNOSIS — Z86711 Personal history of pulmonary embolism: Secondary | ICD-10-CM | POA: Diagnosis not present

## 2014-10-09 DIAGNOSIS — E039 Hypothyroidism, unspecified: Secondary | ICD-10-CM | POA: Diagnosis not present

## 2014-10-09 DIAGNOSIS — I4510 Unspecified right bundle-branch block: Secondary | ICD-10-CM | POA: Diagnosis not present

## 2014-10-09 DIAGNOSIS — I371 Nonrheumatic pulmonary valve insufficiency: Secondary | ICD-10-CM | POA: Insufficient documentation

## 2014-10-09 DIAGNOSIS — I2510 Atherosclerotic heart disease of native coronary artery without angina pectoris: Secondary | ICD-10-CM | POA: Insufficient documentation

## 2014-10-09 DIAGNOSIS — Z7982 Long term (current) use of aspirin: Secondary | ICD-10-CM | POA: Insufficient documentation

## 2014-10-09 DIAGNOSIS — Z01810 Encounter for preprocedural cardiovascular examination: Secondary | ICD-10-CM | POA: Insufficient documentation

## 2014-10-09 DIAGNOSIS — I10 Essential (primary) hypertension: Secondary | ICD-10-CM | POA: Insufficient documentation

## 2014-10-09 HISTORY — PX: LEFT HEART CATHETERIZATION WITH CORONARY ANGIOGRAM: SHX5451

## 2014-10-09 LAB — GLUCOSE, CAPILLARY
Glucose-Capillary: 123 mg/dL — ABNORMAL HIGH (ref 70–99)
Glucose-Capillary: 102 mg/dL — ABNORMAL HIGH (ref 70–99)

## 2014-10-09 SURGERY — LEFT HEART CATHETERIZATION WITH CORONARY ANGIOGRAM
Anesthesia: LOCAL

## 2014-10-09 MED ORDER — HEPARIN (PORCINE) IN NACL 2-0.9 UNIT/ML-% IJ SOLN
INTRAMUSCULAR | Status: AC
  Filled 2014-10-09: qty 1500

## 2014-10-09 MED ORDER — LIDOCAINE HCL (PF) 1 % IJ SOLN
INTRAMUSCULAR | Status: AC
  Filled 2014-10-09: qty 30

## 2014-10-09 MED ORDER — VERAPAMIL HCL 2.5 MG/ML IV SOLN
INTRAVENOUS | Status: AC
  Filled 2014-10-09: qty 2

## 2014-10-09 MED ORDER — ASPIRIN 81 MG PO CHEW
CHEWABLE_TABLET | ORAL | Status: AC
  Filled 2014-10-09: qty 1

## 2014-10-09 MED ORDER — FENTANYL CITRATE 0.05 MG/ML IJ SOLN
INTRAMUSCULAR | Status: AC
  Filled 2014-10-09: qty 2

## 2014-10-09 MED ORDER — HEPARIN SODIUM (PORCINE) 1000 UNIT/ML IJ SOLN
INTRAMUSCULAR | Status: AC
  Filled 2014-10-09: qty 1

## 2014-10-09 MED ORDER — METFORMIN HCL 500 MG PO TABS: 500.0000 mg | ORAL_TABLET | Freq: Two times a day (BID) | ORAL | Status: DC

## 2014-10-09 MED ORDER — MIDAZOLAM HCL 2 MG/2ML IJ SOLN
INTRAMUSCULAR | Status: AC
  Filled 2014-10-09: qty 2

## 2014-10-09 MED ORDER — SODIUM CHLORIDE 0.9 % IV SOLN: 1.0000 mL/kg/h | INTRAVENOUS | Status: DC

## 2014-10-09 MED ORDER — NITROGLYCERIN 1 MG/10 ML FOR IR/CATH LAB
INTRA_ARTERIAL | Status: AC
  Filled 2014-10-09: qty 10

## 2014-10-09 NOTE — Progress Notes (Signed)
TR BAND REMOVAL  LOCATION:    right radial  DEFLATED PER PROTOCOL:    Yes.    TIME BAND OFF / DRESSING APPLIED:    1200   SITE UPON ARRIVAL:    Level 0  SITE AFTER BAND REMOVAL:    Level 0  CIRCULATION SENSATION AND MOVEMENT:    Within Normal Limits   Yes.    COMMENTS:   TRB REMOVED/ TEGADERM DSG APPLIED

## 2014-10-09 NOTE — Interval H&P Note (Signed)
Cath Lab Visit (complete for each Cath Lab visit)  Clinical Evaluation Leading to the Procedure:   ACS: No.  Non-ACS:    Anginal Classification: CCS II  Anti-ischemic medical therapy: Minimal Therapy (1 class of medications)  Non-Invasive Test Results: No non-invasive testing performed  Prior CABG: No previous CABG      History and Physical Interval Note:  10/09/2014 9:22 AM  Jeffrey Guerrero  has presented today for surgery, with the diagnosis of preopen heart for pulmonic valve  The various methods of treatment have been discussed with the patient and family. After consideration of risks, benefits and other options for treatment, the patient has consented to  Procedure(s): LEFT HEART CATHETERIZATION WITH CORONARY ANGIOGRAM ([name]) as a surgical intervention .  The patient's history has been reviewed, patient examined, no change in status, stable for surgery.  I have reviewed the patient's chart and labs.  Questions were answered to the patient's satisfaction.     Jeffrey Hannis S.

## 2014-10-09 NOTE — CV Procedure (Addendum)
       PROCEDURE:  Left heart catheterization with selective coronary angiography, left ventriculogram.  INDICATIONS:  Preoperative eval, severe pulmonic regurgitation  The risks, benefits, and details of the procedure were explained to the patient.  The patient verbalized understanding and wanted to proceed.  Informed written consent was obtained.  PROCEDURE TECHNIQUE:  After Xylocaine anesthesia a 26F slender sheath was placed in the right radial artery with a single anterior needle wall stick.   IV Heparin was given.  Right coronary angiography was done using a Judkins R4 guide catheter.  Left coronary angiography was done using a Judkins L3.5 guide catheter.  Left ventriculography was done using a pigtail catheter.  A TR band was used for hemostasis.   CONTRAST:  Total of 75 cc.  COMPLICATIONS:  None.    HEMODYNAMICS:  Aortic pressure was 100/55; LV pressure was 103/1; LVEDP 8.  There was no gradient between the left ventricle and aorta.    ANGIOGRAPHIC DATA:   The left main coronary artery is widely patent.  The left anterior descending artery is a large vessel which reaches the apex. There is minimal, diffuse atherosclerosis. There is no hemodynamically significant disease.   There is a large first diagonal which is widely patent.  The left circumflex artery is a large vessel. The first obtuse marginal is large and branches across lateral wall. There is minimal atherosclerosis in the circumflex system without hemodynamically significant disease.   The right coronary artery is a large, dominant vessel. There is minimal, diffuse atherosclerosis in the RCA. The posterior lateral artery is small but patent. The posterior descending artery is a large, long vessel which reaches the apex. There is no hemodynamic significant disease in the RCA system.   LEFT VENTRICULOGRAM:  Left ventricular angiogram was done in the 30 RAO projection and revealed normal left ventricular wall motion and  systolic function with an estimated ejection fraction of 60%.  LVEDP was 8 mmHg.  IMPRESSIONS:  1. Widely patent left main coronary artery. 2. Minimal atherosclerotic disease in the  left anterior descending artery and its branches. 3. Minimal atherosclerotic disease in the  left circumflex artery and its branches. 4. Mild disease in the  right coronary artery. 5. Normal left ventricular systolic function.  LVEDP 8 mmHg.  Ejection fraction 60%.  RECOMMENDATION:  Continue with plans for pulmonic valve replacement per Dr. Roxy Manns and Dr. [name].  The patient will be discharged later today.

## 2014-10-09 NOTE — Discharge Instructions (Signed)
° ° ° °  HOLD METFORMIN FOR 48 HOURS   Radial Site Care Refer to this sheet in the next few weeks. These instructions provide you with information on caring for yourself after your procedure. Your caregiver may also give you more specific instructions. Your treatment has been planned according to current medical practices, but problems sometimes occur. Call your caregiver if you have any problems or questions after your procedure. HOME CARE INSTRUCTIONS  You may shower the day after the procedure.Remove the bandage (dressing) and gently wash the site with plain soap and water.Gently pat the site dry.  Do not apply powder or lotion to the site.  Do not submerge the affected site in water for 3 to 5 days.  Inspect the site at least twice daily.  Do not flex or bend the affected arm for 24 hours.  No lifting over 5 pounds (2.3 kg) for 5 days after your procedure.  Do not drive home if you are discharged the same day of the procedure. Have someone else drive you.  You may drive 24 hours after the procedure unless otherwise instructed by your caregiver.  Do not operate machinery or power tools for 24 hours.  A responsible adult should be with you for the first 24 hours after you arrive home. What to expect:  Any bruising will usually fade within 1 to 2 weeks.  Blood that collects in the tissue (hematoma) may be painful to the touch. It should usually decrease in size and tenderness within 1 to 2 weeks. SEEK IMMEDIATE MEDICAL CARE IF:  You have unusual pain at the radial site.  You have redness, warmth, swelling, or pain at the radial site.  You have drainage (other than a small amount of blood on the dressing).  You have chills.  You have a fever or persistent symptoms for more than 72 hours.  You have a fever and your symptoms suddenly get worse.  Your arm becomes pale, cool, tingly, or numb.  You have heavy bleeding from the site. Hold pressure on the site. Document  Released: 11/07/2010 Document Revised: 12/28/2011 Document Reviewed: 11/07/2010 [facility] Patient Information 2015 [place], [place]. This information is not intended to replace advice given to you by your health care provider. Make sure you discuss any questions you have with your health care provider.

## 2014-10-09 NOTE — H&P (View-Only) (Signed)
HPI Patient is a 63 yo who was hospitalized earlier this spring with Sepsis.  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  Also pulmonray emboli.   He completed a course of IV Abx.  Was seen by Dr Johnnye Sima in July  Felt to be successfully treated.  Now has been off of abx  I saw him back in August  He did not want to consider surgery at that time   He has since seen C Roxy Manns and has discussed PV replacement  Plan for surgery in Jan He denies CP  No SOB  No dizziness  No palpitations  No edema     No Known Allergies  Current Outpatient Prescriptions  Medication Sig Dispense Refill  . amLODipine (NORVASC) 5 MG tablet Take 10 mg by mouth daily.    Marland Kitchen aspirin EC 81 MG tablet Take 81 mg by mouth daily.    Marland Kitchen levothyroxine (SYNTHROID, LEVOTHROID) 125 MCG tablet Take 1 tablet (125 mcg total) by mouth daily before breakfast. 30 tablet 0  . metFORMIN (GLUCOPHAGE) 500 MG tablet Take 500 mg by mouth 2 (two) times daily.     No current facility-administered medications for this visit.    Past Medical History  Diagnosis Date  . Hypertension   . Thyroid disease   . Hypothyroidism   . Arthritis     "knees" (02/13/2014)  . 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 Pulmonic valve insufficiency 02/16/2014    severe    Past Surgical History  Procedure Laterality Date  . Tonsillectomy    . Appendectomy    . Tee without cardioversion [name] 02/16/2014    Procedure: TRANSESOPHAGEAL ECHOCARDIOGRAM (TEE);  Surgeon: [name], MD;  Location: [facility] ENDOSCOPY;  Service: Cardiovascular;  Laterality: [name];  . Tee without cardioversion [name] 04/12/2014    Procedure: TRANSESOPHAGEAL ECHOCARDIOGRAM (TEE);  Surgeon: [name], MD;  Location: [facility] ENDOSCOPY;  Service: Cardiovascular;  Laterality: [name];    No family history on file.  History    Social History  . Marital Status: Married    Spouse Name: [name]    Number of Children: [name]  . Years of Education: [name]   Occupational History  . Not on file.   Social History Main Topics  . Smoking status: Never Smoker   . Smokeless tobacco: Never Used  . Alcohol Use: 1.2 oz/week    2 Cans of beer per week     Comment: Not drinking while on Xarelto  . Drug Use: No  . Sexual Activity: Yes   Other Topics Concern  . Not on file   Social History Narrative    Review of Systems:  All systems reviewed.  They are negative to the above problem except as previously stated.  Vital Signs: BP 140/80 mmHg  Pulse 65  Ht 6\' 1"  (1.854 m)  Wt 200 lb (90.719 kg)  BMI 26.39 kg/m2  Physical Exam patinet is in NAD HEENT:  Normocephalic, atraumatic. EOMI, PERRLA.  Neck: JVP is normal.  No bruits.  Lungs: clear to auscultation. No rales no wheezes.  Heart: Regular rate and rhythm. Normal S1, S2. No S3  Gr II/VI systolic murmur LSB  Gr II-III/VI diastolic murmur. PMI not displaced.  Abdomen:  Supple, nontender. Normal bowel sounds. No masses. No hepatomegaly.  Extremities:   Good distal pulses throughout. No lower extremity edema.  Musculoskeletal :moving all extremities.  Neuro:   alert and oriented x3.  CN II-XII grossly intact.  EKG  SR 61  Incomplete RBBB   Assessment and Plan:  1.  PV endocarditis. Finished antibiotics earlier this summber   Appears to have been free of residual infection.  Unfortunately valve was severely damaged.  Now with severe PI He has discussed with surgery and plan is for this to be done on Jan 14.  Will arrange for L heart cath prior.  3.  PE  CT has shown resolution of PE  He has completed 4 month of Xarelto  Now off    3.  HTN Fair control     4.  Hypothyroidism WIll check TSH

## 2014-10-29 ENCOUNTER — Encounter (HOSPITAL_COMMUNITY): Payer: Self-pay

## 2014-10-29 ENCOUNTER — Ambulatory Visit (INDEPENDENT_AMBULATORY_CARE_PROVIDER_SITE_OTHER): Payer: 59 | Admitting: Thoracic Surgery (Cardiothoracic Vascular Surgery)

## 2014-10-29 ENCOUNTER — Ambulatory Visit (HOSPITAL_COMMUNITY)
Admission: RE | Admit: 2014-10-29 | Discharge: 2014-10-29 | Disposition: A | Discharge status: Home / Self Care | Payer: 59 | Source: Ambulatory Visit | Attending: Thoracic Surgery (Cardiothoracic Vascular Surgery) | Admitting: Thoracic Surgery (Cardiothoracic Vascular Surgery)

## 2014-10-29 ENCOUNTER — Encounter (HOSPITAL_COMMUNITY)
Admission: RE | Admit: 2014-10-29 | Discharge: 2014-10-29 | Disposition: A | Discharge status: Home / Self Care | Payer: 59 | Source: Ambulatory Visit | Attending: Thoracic Surgery (Cardiothoracic Vascular Surgery) | Admitting: Thoracic Surgery (Cardiothoracic Vascular Surgery)

## 2014-10-29 ENCOUNTER — Encounter: Payer: Self-pay | Admitting: Thoracic Surgery (Cardiothoracic Vascular Surgery)

## 2014-10-29 ENCOUNTER — Other Ambulatory Visit (HOSPITAL_COMMUNITY): Payer: Self-pay | Admitting: *Deleted

## 2014-10-29 VITALS — BP 148/80 | HR 75 | Resp 20 | Ht 73.0 in | Wt 193.0 lb

## 2014-10-29 VITALS — BP 130/79 | HR 62 | Temp 98.4°F | Resp 18 | Ht 73.0 in | Wt 193.4 lb

## 2014-10-29 DIAGNOSIS — Z01810 Encounter for preprocedural cardiovascular examination: Secondary | ICD-10-CM

## 2014-10-29 DIAGNOSIS — Z8679 Personal history of other diseases of the circulatory system: Secondary | ICD-10-CM

## 2014-10-29 DIAGNOSIS — I371 Nonrheumatic pulmonary valve insufficiency: Secondary | ICD-10-CM

## 2014-10-29 DIAGNOSIS — I379 Nonrheumatic pulmonary valve disorder, unspecified: Secondary | ICD-10-CM

## 2014-10-29 DIAGNOSIS — J984 Other disorders of lung: Secondary | ICD-10-CM

## 2014-10-29 DIAGNOSIS — Z736 Limitation of activities due to disability: Secondary | ICD-10-CM

## 2014-10-29 HISTORY — DX: Type 2 diabetes mellitus without complications: E11.9

## 2014-10-29 LAB — COMPREHENSIVE METABOLIC PANEL
ALBUMIN: 4 g/dL (ref 3.5–5.2)
ALT: 16 U/L (ref 0–53)
AST: 20 U/L (ref 0–37)
Alkaline Phosphatase: 47 U/L (ref 39–117)
Anion gap: 8 (ref 5–15)
BILIRUBIN TOTAL: 0.5 mg/dL (ref 0.3–1.2)
BUN: 12 mg/dL (ref 6–23)
CO2: 20 mmol/L (ref 19–32)
Calcium: 9 mg/dL (ref 8.4–10.5)
Chloride: 110 mEq/L (ref 96–112)
Creatinine, Ser: 0.85 mg/dL (ref 0.50–1.35)
GFR calc non Af Amer: 89 mL/min — ABNORMAL LOW (ref >90)
Glucose, Bld: 99 mg/dL (ref 70–99)
Potassium: 3.5 mmol/L (ref 3.5–5.1)
Sodium: 138 mmol/L (ref 135–145)
Total Protein: 6.9 g/dL (ref 6.0–8.3)

## 2014-10-29 LAB — PULMONARY FUNCTION TEST
DL/VA % PRED: 90 %
DL/VA: 4.24 ml/min/mmHg/L
DLCO COR % PRED: 82 %
DLCO COR: 27.85 ml/min/mmHg
DLCO UNC % PRED: 82 %
DLCO UNC: 27.85 ml/min/mmHg
FEF 25-75 Post: 3.85 L/sec
FEF 25-75 Pre: 3 L/sec
FEF2575-%CHANGE-POST: 28 %
FEF2575-%PRED-POST: 138 %
FEF2575-%Pred-Pre: 108 %
FEV1-%Change-Post: 4 %
FEV1-%PRED-POST: 117 %
FEV1-%PRED-PRE: 112 %
FEV1-Post: 3.65 L
FEV1-Pre: 3.51 L
FEV1FVC-%Change-Post: 2 %
FEV1FVC-%Pred-Pre: 102 %
FEV6-%Change-Post: 3 %
FEV6-%PRED-PRE: 112 %
FEV6-%Pred-Post: 116 %
FEV6-POST: 4.54 L
FEV6-PRE: 4.41 L
FEV6FVC-%CHANGE-POST: 1 %
FEV6FVC-%PRED-PRE: 102 %
FEV6FVC-%Pred-Post: 104 %
FVC-%Change-Post: 1 %
FVC-%Pred-Post: 111 %
FVC-%Pred-Pre: 109 %
FVC-PRE: 4.46 L
FVC-Post: 4.54 L
POST FEV1/FVC RATIO: 80 %
POST FEV6/FVC RATIO: 100 %
PRE FEV6/FVC RATIO: 99 %
Pre FEV1/FVC ratio: 79 %
RV % pred: 104 %
RV: 2.51 L
TLC % PRED: 99 %
TLC: 7.19 L

## 2014-10-29 LAB — URINALYSIS, ROUTINE W REFLEX MICROSCOPIC
Bilirubin Urine: NEGATIVE
Glucose, UA: NEGATIVE mg/dL
HGB URINE DIPSTICK: NEGATIVE
KETONES UR: NEGATIVE mg/dL
LEUKOCYTES UA: NEGATIVE
Nitrite: NEGATIVE
PH: 5.5 (ref 5.0–8.0)
Protein, ur: NEGATIVE mg/dL
SPECIFIC GRAVITY, URINE: 1.02 (ref 1.005–1.030)
UROBILINOGEN UA: 1 mg/dL (ref 0.0–1.0)

## 2014-10-29 LAB — TYPE AND SCREEN
ABO/RH(D): A POS
Antibody Screen: NEGATIVE

## 2014-10-29 LAB — BLOOD GAS, ARTERIAL
Acid-Base Excess: 0.7 mmol/L (ref 0.0–2.0)
Bicarbonate: 24.6 mEq/L — ABNORMAL HIGH (ref 20.0–24.0)
Drawn by: 421801
FIO2: 0.21 %
O2 SAT: 95.1 %
PH ART: 7.425 (ref 7.350–7.450)
PO2 ART: 75.6 mmHg — AB (ref 80.0–100.0)
Patient temperature: 98.6
TCO2: 25.8 mmol/L (ref 0–100)
pCO2 arterial: 38.2 mmHg (ref 35.0–45.0)

## 2014-10-29 LAB — CBC
HEMATOCRIT: 40 % (ref 39.0–52.0)
HEMOGLOBIN: 13.6 g/dL (ref 13.0–17.0)
MCH: 29.8 pg (ref 26.0–34.0)
MCHC: 34 g/dL (ref 30.0–36.0)
MCV: 87.5 fL (ref 78.0–100.0)
Platelets: 158 10*3/uL (ref 150–400)
RBC: 4.57 MIL/uL (ref 4.22–5.81)
RDW: 13.6 % (ref 11.5–15.5)
WBC: 4.7 10*3/uL (ref 4.0–10.5)

## 2014-10-29 LAB — PROTIME-INR
INR: 1.04 (ref 0.00–1.49)
PROTHROMBIN TIME: 13.7 s (ref 11.6–15.2)

## 2014-10-29 LAB — SURGICAL PCR SCREEN
MRSA, PCR: NEGATIVE
STAPHYLOCOCCUS AUREUS: NEGATIVE

## 2014-10-29 LAB — HEMOGLOBIN A1C
HEMOGLOBIN A1C: 6.2 % — AB (ref <5.7)
MEAN PLASMA GLUCOSE: 131 mg/dL — AB (ref <117)

## 2014-10-29 LAB — APTT: aPTT: 30 seconds (ref 24–37)

## 2014-10-29 MED ORDER — ALBUTEROL SULFATE (2.5 MG/3ML) 0.083% IN NEBU
2.5000 mg | INHALATION_SOLUTION | Freq: Once | RESPIRATORY_TRACT | Status: AC
  Administered 2014-10-29: 2.5 mg via RESPIRATORY_TRACT

## 2014-10-29 NOTE — Pre-Procedure Instructions (Signed)
Jeffrey Guerrero  10/29/2014   Your procedure is scheduled on:  Thursday, November 01, 2014 at 7:30 AM.   Report to [facility] Entrance "A" Admitting Office at 5:30 AM.   Call this number if you have problems the morning of surgery: [phone]     Remember:   Do not eat food or drink liquids after midnight Wednesday, 10/31/14.   Take these medicines the morning of surgery with A SIP OF WATER: Amlodipine (Norvasc), Levothyroxine (Synthroid)  Do not take Metformin the morning of surgery.   Do not wear jewelry, make-up or nail polish.  Do not wear lotions, powders, or perfumes. You may NOT wear deodorant.  Do not shave 48 hours prior to surgery. Men may shave face and neck.  Do not bring valuables to the hospital.  [facility] is not responsible                  for any belongings or valuables.               Contacts, dentures or bridgework may not be worn into surgery.  Leave suitcase in the car. After surgery it may be brought to your room.  For patients admitted to the hospital, discharge time is determined by your                treatment team.               Special Instructions: [facility] - Preparing for Surgery  Before surgery, you can play an important role.  Because skin is not sterile, your skin needs to be as free of germs as possible.  You can reduce the number of germs on you skin by washing with CHG (chlorahexidine gluconate) soap before surgery.  CHG is an antiseptic cleaner which kills germs and bonds with the skin to continue killing germs even after washing.  Please DO NOT use if you have an allergy to CHG or antibacterial soaps.  If your skin becomes reddened/irritated stop using the CHG and inform your nurse when you arrive at Short Stay.  Do not shave (including legs and underarms) for at least 48 hours prior to the first CHG shower.  You may shave your face.  Please follow these instructions carefully:   1.  Shower with CHG Soap the night before surgery  and the                                morning of Surgery.  2.  If you choose to wash your hair, wash your hair first as usual with your       normal shampoo.  3.  After you shampoo, rinse your hair and body thoroughly to remove the                      Shampoo.  4.  Use CHG as you would any other liquid soap.  You can apply chg directly       to the skin and wash gently with scrungie or a clean washcloth.  5.  Apply the CHG Soap to your body ONLY FROM THE NECK DOWN.        Do not use on open wounds or open sores.  Avoid contact with your eyes, ears, mouth and genitals (private parts).  Wash genitals (private parts) with your normal soap.  6.  Wash thoroughly, paying special attention to the  area where your surgery        will be performed.  7.  Thoroughly rinse your body with warm water from the neck down.  8.  DO NOT shower/wash with your normal soap after using and rinsing off       the CHG Soap.  9.  Pat yourself dry with a clean towel.            10.  Wear clean pajamas.            11.  Place clean sheets on your bed the night of your first shower and do not        sleep with pets.  Day of Surgery  Do not apply any lotions/deodorants the morning of surgery.  Please wear clean clothes to the hospital.     Please read over the following fact sheets that you were given: Pain Booklet, Coughing and Deep Breathing, Blood Transfusion Information, MRSA Information and Surgical Site Infection Prevention

## 2014-10-29 NOTE — Progress Notes (Signed)
[address]       ,[place] [address]             [phone]     CARDIOTHORACIC SURGERY OFFICE NOTE  Referring Provider is [name], MD PCP is [name], MD   HPI:  Patient returns for follow-up of severe pulmonic insufficiency. He was originally seen in consultation on 06/18/2014 and he was seen more recently on 09/24/2014 at which time we made plans to proceed with human allograft pulmonic valve replacement. Since then the patient underwent diagnostic cardiac catheterization on 10/09/2014. This was notable for the presence of minimal nonobstructive coronary artery disease and normal left ventricular systolic function. Right heart catheterization was not performed.  The patient returns to the office today with plans to proceed with surgery on 11/01/2014. He reports no new problems or complaints over the last few weeks. He specifically denies any problems with shortness of breath, cough, fevers, chills, or chest pain.   Current Outpatient Prescriptions  Medication Sig Dispense Refill  . amLODipine (NORVASC) 10 MG tablet Take 10 mg by mouth daily.    Marland Kitchen levothyroxine (SYNTHROID, LEVOTHROID) 100 MCG tablet Take 100 mcg by mouth daily before breakfast.    . metFORMIN (GLUCOPHAGE) 500 MG tablet Take 1 tablet (500 mg total) by mouth 2 (two) times daily.    Marland Kitchen aspirin EC 81 MG tablet Take 81 mg by mouth daily.     No current facility-administered medications for this visit.      Physical Exam:   BP 148/80 mmHg  Pulse 75  Resp 20  Ht 6\' 1"  (1.854 m)  Wt 193 lb (87.544 kg)  BMI 25.47 kg/m2  SpO2 98%  General:  Well-appearing  Chest:   Clear  CV:   Regular rate and rhythm with prominent diastolic murmur that is exacerbated with deep inspiration  Incisions:  n/a  Abdomen:  Soft and nontender  Extremities:  Warm and well-perfused  Diagnostic Tests:   CARDIAC CATHETERIZATION  PROCEDURE: Left heart catheterization with selective coronary  angiography, left ventriculogram.  INDICATIONS: Preoperative eval, severe pulmonic regurgitation  The risks, benefits, and details of the procedure were explained to the patient. The patient verbalized understanding and wanted to proceed. Informed written consent was obtained.  PROCEDURE TECHNIQUE: After Xylocaine anesthesia a 34F slender sheath was placed in the right radial artery with a single anterior needle wall stick. IV Heparin was given. Right coronary angiography was done using a Judkins R4 guide catheter. Left coronary angiography was done using a Judkins L3.5 guide catheter. Left ventriculography was done using a pigtail catheter. A TR band was used for hemostasis.  CONTRAST: Total of 75 cc.  COMPLICATIONS: None.   HEMODYNAMICS: Aortic pressure was 100/55; LV pressure was 103/1; LVEDP 8. There was no gradient between the left ventricle and aorta.   ANGIOGRAPHIC DATA: The left main coronary artery is widely patent.  The left anterior descending artery is a large vessel which reaches the apex. There is minimal, diffuse atherosclerosis. There is no hemodynamically significant disease. There is a large first diagonal which is widely patent.  The left circumflex artery is a large vessel. The first obtuse marginal is large and branches across lateral wall. There is minimal atherosclerosis in the circumflex system without hemodynamically significant disease.   The right coronary artery is a large, dominant vessel. There is minimal, diffuse atherosclerosis in the RCA. The posterior lateral artery is small but patent. The posterior descending artery is a  large, long vessel which reaches the apex. There is no hemodynamic significant disease in the RCA system.   LEFT VENTRICULOGRAM: Left ventricular angiogram was done in the 30 RAO projection and revealed normal left ventricular wall motion and systolic function with an estimated ejection fraction of 60%. LVEDP was 8  mmHg.  IMPRESSIONS:  1. Widely patent left main coronary artery. 2. Minimal atherosclerotic disease in the left anterior descending artery and its branches. 3. Minimal atherosclerotic disease in the left circumflex artery and its branches. 4. Mild disease in the right coronary artery. 5. Normal left ventricular systolic function. LVEDP 8 mmHg. Ejection fraction 60%.  RECOMMENDATION: Continue with plans for pulmonic valve replacement per Dr. Roxy Manns and Dr. [name]. The patient will be discharged later today.            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 He has now recovered completely from the endocarditis. Most recent followup transesophageal echocardiogram confirmed the presence of severe pulmonic valve insufficiency and mild to moderate right ventricular dysfunction.  Cardiac catheterization is notable for the absence of significant coronary artery disease.  Options include close observation versus elective surgical replacement, with human allograft pulmonic valve replacement likely the best surgical option.    Plan:  The patient and his wife were again counseled at length regarding the indications, risks and potential benefits of surgery.  Alternative treatment strategies were discussed. They understand and accept all potential risks of surgery including but not limited to risk of death, stroke or other neurologic complication, myocardial infarction, congestive heart failure, respiratory failure, renal failure, bleeding requiring transfusion and/or reexploration, arrhythmia, infection or other wound complications, pneumonia, pleural and/or pericardial effusion, pulmonary embolus, aortic dissection or other major vascular complication, or delayed complications related to valve repair or replacement including but not  limited to structural valve deterioration and failure, thrombosis, embolization, or endocarditis.  All of their questions have been answered.    I spent in excess of 15 minutes during the conduct of this office consultation and >50% of this time involved direct face-to-face encounter with the patient for counseling and/or coordination of their care.   Valentina Gu. Roxy Manns, MD 10/29/2014 6:48 PM

## 2014-10-29 NOTE — Patient Instructions (Signed)
Patient has been instructed to stop taking asprin  Patient should continue taking all other medications without change through the day before surgery.  Patient should have nothing to eat or drink after midnight the night before surgery.  On the morning of surgery patient should take only amlodipine (NORVASC) and levothyroxine (SYNTHROID) with a sip of water.

## 2014-10-29 NOTE — Progress Notes (Addendum)
VASCULAR LAB PRELIMINARY  PRELIMINARY  PRELIMINARY  PRELIMINARY  Pre-op Cardiac Surgery  Carotid Findings:  Bilateral:  1-39% ICA stenosis.  Vertebral artery flow is antegrade.     Upper Extremity Right Left  Brachial Pressures 122 Triphasic 121 Triphasic  Radial Waveforms Triphasic Triphasic  Ulnar Waveforms Triphasic Triphasic  Palmar Arch (Allen's Test) Abnormal Normal   Findings:  Right  Doppler waveforms diminished greater than 50% with ulnar compressions and remained normal with radial compression. Left Doppler waveforms remained normal with both radail and ulnar compressions.   Selinda Korzeniewski, RVS 10/29/2014, 1:11 PM

## 2014-10-30 LAB — ABO/RH: ABO/RH(D): A POS

## 2014-10-31 MED ORDER — MAGNESIUM SULFATE 50 % IJ SOLN
40.0000 meq | INTRAMUSCULAR | Status: DC
  Filled 2014-10-31: qty 10

## 2014-10-31 MED ORDER — SODIUM CHLORIDE 0.9 % IV SOLN
INTRAVENOUS | Status: DC
  Filled 2014-10-31: qty 30

## 2014-10-31 MED ORDER — VANCOMYCIN HCL 10 G IV SOLR
1250.0000 mg | INTRAVENOUS | Status: AC
  Administered 2014-11-01: 1250 mg via INTRAVENOUS
  Filled 2014-10-31: qty 1250

## 2014-10-31 MED ORDER — DOPAMINE-DEXTROSE 3.2-5 MG/ML-% IV SOLN
0.0000 ug/kg/min | INTRAVENOUS | Status: DC
  Filled 2014-10-31: qty 250

## 2014-10-31 MED ORDER — NITROGLYCERIN IN D5W 200-5 MCG/ML-% IV SOLN
2.0000 ug/min | INTRAVENOUS | Status: AC
  Administered 2014-11-01: 10 ug/min via INTRAVENOUS
  Administered 2014-11-01: 5 ug/min via INTRAVENOUS
  Filled 2014-10-31: qty 250

## 2014-10-31 MED ORDER — DEXTROSE 5 % IV SOLN
750.0000 mg | INTRAVENOUS | Status: DC
  Filled 2014-10-31: qty 750

## 2014-10-31 MED ORDER — DEXTROSE 5 % IV SOLN
1.5000 g | INTRAVENOUS | Status: AC
  Administered 2014-11-01: 1.5 g via INTRAVENOUS
  Administered 2014-11-01: .75 g via INTRAVENOUS
  Filled 2014-10-31 (×2): qty 1.5

## 2014-10-31 MED ORDER — DEXMEDETOMIDINE HCL IN NACL 400 MCG/100ML IV SOLN
0.1000 ug/kg/h | INTRAVENOUS | Status: AC
Start: 2014-11-01 — End: 2014-11-01
  Administered 2014-11-01: .4 ug/kg/h via INTRAVENOUS
  Filled 2014-10-31: qty 100

## 2014-10-31 MED ORDER — DEXTROSE 5 % IV SOLN
30.0000 ug/min | INTRAVENOUS | Status: DC
  Filled 2014-10-31: qty 2

## 2014-10-31 MED ORDER — PLASMA-LYTE 148 IV SOLN
INTRAVENOUS | Status: AC
  Administered 2014-11-01: 500 mL
  Filled 2014-10-31: qty 2.5

## 2014-10-31 MED ORDER — EPINEPHRINE HCL 1 MG/ML IJ SOLN
0.0000 ug/min | INTRAMUSCULAR | Status: DC
  Filled 2014-10-31: qty 4

## 2014-10-31 MED ORDER — METOPROLOL TARTRATE 12.5 MG HALF TABLET
12.5000 mg | ORAL_TABLET | Freq: Once | ORAL | Status: DC
  Filled 2014-10-31: qty 1

## 2014-10-31 MED ORDER — POTASSIUM CHLORIDE 2 MEQ/ML IV SOLN
80.0000 meq | INTRAVENOUS | Status: DC
  Filled 2014-10-31: qty 40

## 2014-10-31 MED ORDER — SODIUM CHLORIDE 0.9 % IV SOLN
INTRAVENOUS | Status: AC
  Administered 2014-11-01: 69.8 mL/h via INTRAVENOUS
  Filled 2014-10-31: qty 40

## 2014-10-31 MED ORDER — VANCOMYCIN HCL 1000 MG IV SOLR
INTRAVENOUS | Status: AC
  Administered 2014-11-01: 1000 mL
  Filled 2014-10-31: qty 1000

## 2014-10-31 MED ORDER — SODIUM CHLORIDE 0.9 % IV SOLN
INTRAVENOUS | Status: AC
  Administered 2014-11-01: 2 [IU]/h via INTRAVENOUS
  Filled 2014-10-31: qty 2.5

## 2014-11-01 ENCOUNTER — Inpatient Hospital Stay (HOSPITAL_COMMUNITY): Payer: 59 | Admitting: Anesthesiology

## 2014-11-01 ENCOUNTER — Inpatient Hospital Stay (HOSPITAL_COMMUNITY)
Admission: RE | Admit: 2014-11-01 | Discharge: 2014-11-04 | DRG: 220 | Disposition: A | Discharge status: Home / Self Care | Payer: 59 | Source: Ambulatory Visit | Attending: Thoracic Surgery (Cardiothoracic Vascular Surgery) | Admitting: Thoracic Surgery (Cardiothoracic Vascular Surgery)

## 2014-11-01 ENCOUNTER — Inpatient Hospital Stay (HOSPITAL_COMMUNITY): Payer: 59

## 2014-11-01 ENCOUNTER — Encounter (HOSPITAL_COMMUNITY): Payer: Self-pay | Admitting: *Deleted

## 2014-11-01 ENCOUNTER — Encounter (HOSPITAL_COMMUNITY)
Admission: RE | Disposition: A | Discharge status: Home / Self Care | Payer: Medicare Other | Source: Ambulatory Visit | Attending: Thoracic Surgery (Cardiothoracic Vascular Surgery)

## 2014-11-01 DIAGNOSIS — Z7982 Long term (current) use of aspirin: Secondary | ICD-10-CM | POA: Diagnosis not present

## 2014-11-01 DIAGNOSIS — D62 Acute posthemorrhagic anemia: Secondary | ICD-10-CM | POA: Diagnosis not present

## 2014-11-01 DIAGNOSIS — E119 Type 2 diabetes mellitus without complications: Secondary | ICD-10-CM | POA: Diagnosis present

## 2014-11-01 DIAGNOSIS — E039 Hypothyroidism, unspecified: Secondary | ICD-10-CM | POA: Diagnosis present

## 2014-11-01 DIAGNOSIS — I371 Nonrheumatic pulmonary valve insufficiency: Principal | ICD-10-CM | POA: Diagnosis present

## 2014-11-01 DIAGNOSIS — Z79899 Other long term (current) drug therapy: Secondary | ICD-10-CM | POA: Diagnosis not present

## 2014-11-01 DIAGNOSIS — E8770 Fluid overload, unspecified: Secondary | ICD-10-CM | POA: Diagnosis not present

## 2014-11-01 DIAGNOSIS — Z954 Presence of other heart-valve replacement: Secondary | ICD-10-CM

## 2014-11-01 DIAGNOSIS — J9811 Atelectasis: Secondary | ICD-10-CM

## 2014-11-01 DIAGNOSIS — I10 Essential (primary) hypertension: Secondary | ICD-10-CM | POA: Diagnosis present

## 2014-11-01 DIAGNOSIS — I379 Nonrheumatic pulmonary valve disorder, unspecified: Secondary | ICD-10-CM | POA: Diagnosis present

## 2014-11-01 DIAGNOSIS — Z86711 Personal history of pulmonary embolism: Secondary | ICD-10-CM

## 2014-11-01 HISTORY — PX: AORTIC VALVE REPLACEMENT: SHX41

## 2014-11-01 HISTORY — DX: Presence of other heart-valve replacement: Z95.4

## 2014-11-01 HISTORY — PX: TEE WITHOUT CARDIOVERSION: SHX5443

## 2014-11-01 LAB — POCT I-STAT 3, ART BLOOD GAS (G3+)
ACID-BASE DEFICIT: 1 mmol/L (ref 0.0–2.0)
ACID-BASE DEFICIT: 3 mmol/L — AB (ref 0.0–2.0)
ACID-BASE EXCESS: 1 mmol/L (ref 0.0–2.0)
Acid-Base Excess: 4 mmol/L — ABNORMAL HIGH (ref 0.0–2.0)
Acid-base deficit: 3 mmol/L — ABNORMAL HIGH (ref 0.0–2.0)
Acid-base deficit: 3 mmol/L — ABNORMAL HIGH (ref 0.0–2.0)
BICARBONATE: 26.7 meq/L — AB (ref 20.0–24.0)
BICARBONATE: 27.1 meq/L — AB (ref 20.0–24.0)
BICARBONATE: 24.6 meq/L — AB (ref 20.0–24.0)
Bicarbonate: 28 mEq/L — ABNORMAL HIGH (ref 20.0–24.0)
Bicarbonate: 24.4 mEq/L — ABNORMAL HIGH (ref 20.0–24.0)
Bicarbonate: 23.4 mEq/L (ref 20.0–24.0)
Bicarbonate: 23.4 mEq/L (ref 20.0–24.0)
O2 SAT: 100 %
O2 Saturation: 100 %
O2 Saturation: 100 %
O2 Saturation: 99 %
O2 Saturation: 100 %
O2 Saturation: 97 %
O2 Saturation: 99 %
PCO2 ART: 38.7 mmHg (ref 35.0–45.0)
PCO2 ART: 44.9 mmHg (ref 35.0–45.0)
PH ART: 7.34 — AB (ref 7.350–7.450)
PH ART: 7.467 — AB (ref 7.350–7.450)
PO2 ART: 388 mmHg — AB (ref 80.0–100.0)
PO2 ART: 428 mmHg — AB (ref 80.0–100.0)
PO2 ART: 388 mmHg — AB (ref 80.0–100.0)
PO2 ART: 110 mmHg — AB (ref 80.0–100.0)
Patient temperature: 96.7
Patient temperature: 98.6
TCO2: 28 mmol/L (ref 0–100)
TCO2: 29 mmol/L (ref 0–100)
TCO2: 29 mmol/L (ref 0–100)
TCO2: 26 mmol/L (ref 0–100)
TCO2: 25 mmol/L (ref 0–100)
TCO2: 26 mmol/L (ref 0–100)
TCO2: 25 mmol/L (ref 0–100)
pCO2 arterial: 49.4 mmHg — ABNORMAL HIGH (ref 35.0–45.0)
pCO2 arterial: 47.9 mmHg — ABNORMAL HIGH (ref 35.0–45.0)
pCO2 arterial: 39.3 mmHg (ref 35.0–45.0)
pCO2 arterial: 44.2 mmHg (ref 35.0–45.0)
pCO2 arterial: 53.5 mmHg — ABNORMAL HIGH (ref 35.0–45.0)
pH, Arterial: 7.361 (ref 7.350–7.450)
pH, Arterial: 7.397 (ref 7.350–7.450)
pH, Arterial: 7.331 — ABNORMAL LOW (ref 7.350–7.450)
pH, Arterial: 7.27 — ABNORMAL LOW (ref 7.350–7.450)
pH, Arterial: 7.323 — ABNORMAL LOW (ref 7.350–7.450)
pO2, Arterial: 133 mmHg — ABNORMAL HIGH (ref 80.0–100.0)
pO2, Arterial: 203 mmHg — ABNORMAL HIGH (ref 80.0–100.0)
pO2, Arterial: 143 mmHg — ABNORMAL HIGH (ref 80.0–100.0)

## 2014-11-01 LAB — POCT I-STAT, CHEM 8
BUN: 14 mg/dL (ref 6–23)
BUN: 13 mg/dL (ref 6–23)
BUN: 11 mg/dL (ref 6–23)
BUN: 12 mg/dL (ref 6–23)
BUN: 13 mg/dL (ref 6–23)
BUN: 10 mg/dL (ref 6–23)
CALCIUM ION: 1.05 mmol/L — AB (ref 1.13–1.30)
CHLORIDE: 105 meq/L (ref 96–112)
CREATININE: 0.7 mg/dL (ref 0.50–1.35)
CREATININE: 0.7 mg/dL (ref 0.50–1.35)
CREATININE: 0.7 mg/dL (ref 0.50–1.35)
Calcium, Ion: 1.23 mmol/L (ref 1.13–1.30)
Calcium, Ion: 1.19 mmol/L (ref 1.13–1.30)
Calcium, Ion: 1.08 mmol/L — ABNORMAL LOW (ref 1.13–1.30)
Calcium, Ion: 1.14 mmol/L (ref 1.13–1.30)
Calcium, Ion: 1.21 mmol/L (ref 1.13–1.30)
Chloride: 104 mEq/L (ref 96–112)
Chloride: 104 mEq/L (ref 96–112)
Chloride: 102 mEq/L (ref 96–112)
Chloride: 100 mEq/L (ref 96–112)
Chloride: 98 mEq/L (ref 96–112)
Creatinine, Ser: 0.8 mg/dL (ref 0.50–1.35)
Creatinine, Ser: 0.6 mg/dL (ref 0.50–1.35)
Creatinine, Ser: 0.6 mg/dL (ref 0.50–1.35)
GLUCOSE: 105 mg/dL — AB (ref 70–99)
GLUCOSE: 91 mg/dL (ref 70–99)
GLUCOSE: 150 mg/dL — AB (ref 70–99)
Glucose, Bld: 127 mg/dL — ABNORMAL HIGH (ref 70–99)
Glucose, Bld: 152 mg/dL — ABNORMAL HIGH (ref 70–99)
Glucose, Bld: 151 mg/dL — ABNORMAL HIGH (ref 70–99)
HCT: 37 % — ABNORMAL LOW (ref 39.0–52.0)
HCT: 37 % — ABNORMAL LOW (ref 39.0–52.0)
HCT: 33 % — ABNORMAL LOW (ref 39.0–52.0)
HCT: 26 % — ABNORMAL LOW (ref 39.0–52.0)
HCT: 30 % — ABNORMAL LOW (ref 39.0–52.0)
HCT: 40 % (ref 39.0–52.0)
HEMOGLOBIN: 12.6 g/dL — AB (ref 13.0–17.0)
HEMOGLOBIN: 12.6 g/dL — AB (ref 13.0–17.0)
HEMOGLOBIN: 10.2 g/dL — AB (ref 13.0–17.0)
Hemoglobin: 11.2 g/dL — ABNORMAL LOW (ref 13.0–17.0)
Hemoglobin: 8.8 g/dL — ABNORMAL LOW (ref 13.0–17.0)
Hemoglobin: 13.6 g/dL (ref 13.0–17.0)
POTASSIUM: 3.2 mmol/L — AB (ref 3.5–5.1)
POTASSIUM: 4 mmol/L (ref 3.5–5.1)
POTASSIUM: 3.9 mmol/L (ref 3.5–5.1)
Potassium: 3 mmol/L — ABNORMAL LOW (ref 3.5–5.1)
Potassium: 3.2 mmol/L — ABNORMAL LOW (ref 3.5–5.1)
Potassium: 3.3 mmol/L — ABNORMAL LOW (ref 3.5–5.1)
SODIUM: 138 mmol/L (ref 135–145)
Sodium: 143 mmol/L (ref 135–145)
Sodium: 142 mmol/L (ref 135–145)
Sodium: 142 mmol/L (ref 135–145)
Sodium: 134 mmol/L — ABNORMAL LOW (ref 135–145)
Sodium: 141 mmol/L (ref 135–145)
TCO2: 23 mmol/L (ref 0–100)
TCO2: 24 mmol/L (ref 0–100)
TCO2: 26 mmol/L (ref 0–100)
TCO2: 23 mmol/L (ref 0–100)
TCO2: 24 mmol/L (ref 0–100)
TCO2: 21 mmol/L (ref 0–100)

## 2014-11-01 LAB — PROTIME-INR
INR: 1.19 (ref 0.00–1.49)
PROTHROMBIN TIME: 15.3 s — AB (ref 11.6–15.2)

## 2014-11-01 LAB — GLUCOSE, CAPILLARY
GLUCOSE-CAPILLARY: 102 mg/dL — AB (ref 70–99)
GLUCOSE-CAPILLARY: 121 mg/dL — AB (ref 70–99)
GLUCOSE-CAPILLARY: 140 mg/dL — AB (ref 70–99)
GLUCOSE-CAPILLARY: 132 mg/dL — AB (ref 70–99)
GLUCOSE-CAPILLARY: 140 mg/dL — AB (ref 70–99)
Glucose-Capillary: 99 mg/dL (ref 70–99)
Glucose-Capillary: 113 mg/dL — ABNORMAL HIGH (ref 70–99)
Glucose-Capillary: 64 mg/dL — ABNORMAL LOW (ref 70–99)
Glucose-Capillary: 67 mg/dL — ABNORMAL LOW (ref 70–99)
Glucose-Capillary: 111 mg/dL — ABNORMAL HIGH (ref 70–99)
Glucose-Capillary: 118 mg/dL — ABNORMAL HIGH (ref 70–99)

## 2014-11-01 LAB — CBC
HEMATOCRIT: 37.6 % — AB (ref 39.0–52.0)
HEMATOCRIT: 37.4 % — AB (ref 39.0–52.0)
HEMOGLOBIN: 13 g/dL (ref 13.0–17.0)
Hemoglobin: 13.1 g/dL (ref 13.0–17.0)
MCH: 30.3 pg (ref 26.0–34.0)
MCH: 31 pg (ref 26.0–34.0)
MCHC: 34.8 g/dL (ref 30.0–36.0)
MCHC: 34.8 g/dL (ref 30.0–36.0)
MCV: 87 fL (ref 78.0–100.0)
MCV: 89 fL (ref 78.0–100.0)
PLATELETS: 142 10*3/uL — AB (ref 150–400)
PLATELETS: 132 10*3/uL — AB (ref 150–400)
RBC: 4.32 MIL/uL (ref 4.22–5.81)
RBC: 4.2 MIL/uL — ABNORMAL LOW (ref 4.22–5.81)
RDW: 13.4 % (ref 11.5–15.5)
RDW: 13.4 % (ref 11.5–15.5)
WBC: 8.3 10*3/uL (ref 4.0–10.5)
WBC: 8 10*3/uL (ref 4.0–10.5)

## 2014-11-01 LAB — CREATININE, SERUM
Creatinine, Ser: 0.85 mg/dL (ref 0.50–1.35)
GFR calc non Af Amer: 89 mL/min — ABNORMAL LOW (ref >90)

## 2014-11-01 LAB — PLATELET COUNT: Platelets: 141 10*3/uL — ABNORMAL LOW (ref 150–400)

## 2014-11-01 LAB — APTT: aPTT: 31 seconds (ref 24–37)

## 2014-11-01 LAB — MAGNESIUM: MAGNESIUM: 3 mg/dL — AB (ref 1.5–2.5)

## 2014-11-01 LAB — HEMOGLOBIN AND HEMATOCRIT, BLOOD
HEMATOCRIT: 28.7 % — AB (ref 39.0–52.0)
Hemoglobin: 10 g/dL — ABNORMAL LOW (ref 13.0–17.0)

## 2014-11-01 SURGERY — REPLACEMENT, AORTIC VALVE, OPEN
Anesthesia: General | Site: Chest

## 2014-11-01 MED ORDER — VANCOMYCIN HCL IN DEXTROSE 1-5 GM/200ML-% IV SOLN
1000.0000 mg | Freq: Once | INTRAVENOUS | Status: AC
  Administered 2014-11-01: 1000 mg via INTRAVENOUS
  Filled 2014-11-01: qty 200

## 2014-11-01 MED ORDER — SUCCINYLCHOLINE CHLORIDE 20 MG/ML IJ SOLN
INTRAMUSCULAR | Status: AC
  Filled 2014-11-01: qty 1

## 2014-11-01 MED ORDER — CHLORHEXIDINE GLUCONATE 0.12 % MT SOLN
15.0000 mL | Freq: Two times a day (BID) | OROMUCOSAL | Status: DC
  Administered 2014-11-01: 15 mL via OROMUCOSAL
  Filled 2014-11-01: qty 15

## 2014-11-01 MED ORDER — PROTAMINE SULFATE 10 MG/ML IV SOLN
INTRAVENOUS | Status: AC
  Filled 2014-11-01: qty 25

## 2014-11-01 MED ORDER — SODIUM CHLORIDE 0.9 % IV SOLN: INTRAVENOUS | Status: DC

## 2014-11-01 MED ORDER — ACETAMINOPHEN 650 MG RE SUPP
650.0000 mg | Freq: Once | RECTAL | Status: AC
  Administered 2014-11-01: 650 mg via RECTAL

## 2014-11-01 MED ORDER — FENTANYL CITRATE 0.05 MG/ML IJ SOLN
INTRAMUSCULAR | Status: AC
  Filled 2014-11-01: qty 5

## 2014-11-01 MED ORDER — MIDAZOLAM HCL 2 MG/2ML IJ SOLN
INTRAMUSCULAR | Status: AC
  Filled 2014-11-01: qty 2

## 2014-11-01 MED ORDER — LACTATED RINGERS IV SOLN
INTRAVENOUS | Status: DC
  Administered 2014-11-01: 20 mL/h via INTRAVENOUS

## 2014-11-01 MED ORDER — LEVOTHYROXINE SODIUM 100 MCG PO TABS
100.0000 ug | ORAL_TABLET | Freq: Every day | ORAL | Status: DC
  Administered 2014-11-02 – 2014-11-04 (×3): 100 ug via ORAL
  Filled 2014-11-01 (×4): qty 1

## 2014-11-01 MED ORDER — MIDAZOLAM HCL 5 MG/5ML IJ SOLN
INTRAMUSCULAR | Status: DC | PRN
Start: 2014-11-01 — End: 2014-11-01
  Administered 2014-11-01 (×5): 2 mg via INTRAVENOUS

## 2014-11-01 MED ORDER — DEXTROSE 50 % IV SOLN
INTRAVENOUS | Status: AC
  Administered 2014-11-01: 13 mL
  Filled 2014-11-01: qty 50

## 2014-11-01 MED ORDER — SODIUM CHLORIDE 0.9 % IV SOLN: 250.0000 mL | INTRAVENOUS | Status: DC

## 2014-11-01 MED ORDER — BISACODYL 5 MG PO TBEC
10.0000 mg | DELAYED_RELEASE_TABLET | Freq: Every day | ORAL | Status: DC
  Administered 2014-11-02 – 2014-11-03 (×2): 10 mg via ORAL
  Filled 2014-11-01 (×2): qty 2

## 2014-11-01 MED ORDER — SODIUM CHLORIDE 0.9 % IJ SOLN: 3.0000 mL | INTRAMUSCULAR | Status: DC | PRN

## 2014-11-01 MED ORDER — SODIUM CHLORIDE 0.9 % IV SOLN
INTRAVENOUS | Status: AC
  Administered 2014-11-01: 14:00:00 via INTRAVENOUS

## 2014-11-01 MED ORDER — ESMOLOL HCL 10 MG/ML IV SOLN
INTRAVENOUS | Status: AC
  Filled 2014-11-01: qty 10

## 2014-11-01 MED ORDER — LACTATED RINGERS IV SOLN: 500.0000 mL | Freq: Once | INTRAVENOUS | Status: AC | PRN

## 2014-11-01 MED ORDER — FENTANYL CITRATE 0.05 MG/ML IJ SOLN
INTRAMUSCULAR | Status: DC | PRN
  Administered 2014-11-01 (×3): 250 ug via INTRAVENOUS
  Administered 2014-11-01: 150 ug via INTRAVENOUS
  Administered 2014-11-01 (×2): 250 ug via INTRAVENOUS
  Administered 2014-11-01: 100 ug via INTRAVENOUS
  Administered 2014-11-01: 250 ug via INTRAVENOUS

## 2014-11-01 MED ORDER — DOCUSATE SODIUM 100 MG PO CAPS
200.0000 mg | ORAL_CAPSULE | Freq: Every day | ORAL | Status: DC
  Administered 2014-11-02 – 2014-11-03 (×2): 200 mg via ORAL
  Filled 2014-11-01 (×3): qty 2

## 2014-11-01 MED ORDER — MIDAZOLAM HCL 2 MG/2ML IJ SOLN: 2.0000 mg | INTRAMUSCULAR | Status: DC | PRN

## 2014-11-01 MED ORDER — OXYCODONE HCL 5 MG PO TABS
5.0000 mg | ORAL_TABLET | ORAL | Status: DC | PRN
  Administered 2014-11-02: 5 mg via ORAL
  Administered 2014-11-02: 10 mg via ORAL
  Administered 2014-11-02 (×2): 5 mg via ORAL
  Administered 2014-11-03 – 2014-11-04 (×4): 10 mg via ORAL
  Filled 2014-11-01: qty 2
  Filled 2014-11-01 (×2): qty 1
  Filled 2014-11-01 (×4): qty 2
  Filled 2014-11-01: qty 1

## 2014-11-01 MED ORDER — DEXTROSE 5 % IV SOLN
10.0000 mg | INTRAVENOUS | Status: DC | PRN
  Administered 2014-11-01: 20 ug/min via INTRAVENOUS

## 2014-11-01 MED ORDER — METOPROLOL TARTRATE 1 MG/ML IV SOLN: 2.5000 mg | INTRAVENOUS | Status: DC | PRN

## 2014-11-01 MED ORDER — METOPROLOL TARTRATE 12.5 MG HALF TABLET
12.5000 mg | ORAL_TABLET | Freq: Two times a day (BID) | ORAL | Status: DC
  Administered 2014-11-02 – 2014-11-04 (×5): 12.5 mg via ORAL
  Filled 2014-11-01 (×8): qty 1

## 2014-11-01 MED ORDER — ASPIRIN EC 325 MG PO TBEC
325.0000 mg | DELAYED_RELEASE_TABLET | Freq: Every day | ORAL | Status: DC
  Filled 2014-11-01: qty 1

## 2014-11-01 MED ORDER — ALBUMIN HUMAN 5 % IV SOLN
250.0000 mL | INTRAVENOUS | Status: AC | PRN
  Administered 2014-11-01 (×2): 250 mL via INTRAVENOUS

## 2014-11-01 MED ORDER — NITROGLYCERIN IN D5W 200-5 MCG/ML-% IV SOLN: 0.0000 ug/min | INTRAVENOUS | Status: DC

## 2014-11-01 MED ORDER — VECURONIUM BROMIDE 10 MG IV SOLR
INTRAVENOUS | Status: AC
  Filled 2014-11-01: qty 10

## 2014-11-01 MED ORDER — PROTAMINE SULFATE 10 MG/ML IV SOLN
INTRAVENOUS | Status: DC | PRN
  Administered 2014-11-01: 20 mg via INTRAVENOUS
  Administered 2014-11-01: 280 mg via INTRAVENOUS

## 2014-11-01 MED ORDER — ROCURONIUM BROMIDE 100 MG/10ML IV SOLN
INTRAVENOUS | Status: DC | PRN
  Administered 2014-11-01 (×2): 50 mg via INTRAVENOUS

## 2014-11-01 MED ORDER — ASPIRIN 81 MG PO CHEW: 324.0000 mg | CHEWABLE_TABLET | Freq: Every day | ORAL | Status: DC

## 2014-11-01 MED ORDER — SODIUM CHLORIDE 0.9 % IJ SOLN
INTRAMUSCULAR | Status: AC
  Filled 2014-11-01: qty 10

## 2014-11-01 MED ORDER — SODIUM CHLORIDE 0.45 % IV SOLN
INTRAVENOUS | Status: DC
  Administered 2014-11-01: 10 mL/h via INTRAVENOUS

## 2014-11-01 MED ORDER — LACTATED RINGERS IV SOLN
INTRAVENOUS | Status: DC | PRN
  Administered 2014-11-01: 07:00:00 via INTRAVENOUS

## 2014-11-01 MED ORDER — NITROGLYCERIN 0.2 MG/ML ON CALL CATH LAB
INTRAVENOUS | Status: DC | PRN
  Administered 2014-11-01 (×3): 40 ug via INTRAVENOUS

## 2014-11-01 MED ORDER — PROPOFOL 10 MG/ML IV BOLUS
INTRAVENOUS | Status: AC
  Filled 2014-11-01: qty 20

## 2014-11-01 MED ORDER — FAMOTIDINE IN NACL 20-0.9 MG/50ML-% IV SOLN
20.0000 mg | Freq: Two times a day (BID) | INTRAVENOUS | Status: DC
  Administered 2014-11-01: 20 mg via INTRAVENOUS

## 2014-11-01 MED ORDER — METOPROLOL TARTRATE 25 MG/10 ML ORAL SUSPENSION
12.5000 mg | Freq: Two times a day (BID) | ORAL | Status: DC
  Filled 2014-11-01 (×3): qty 5

## 2014-11-01 MED ORDER — DEXTROSE 5 % IV SOLN
1.5000 g | Freq: Two times a day (BID) | INTRAVENOUS | Status: AC
  Administered 2014-11-01 – 2014-11-03 (×4): 1.5 g via INTRAVENOUS
  Filled 2014-11-01 (×5): qty 1.5

## 2014-11-01 MED ORDER — SODIUM CHLORIDE 0.9 % IR SOLN
Status: DC | PRN
  Administered 2014-11-01: 6000 mL

## 2014-11-01 MED ORDER — EPHEDRINE SULFATE 50 MG/ML IJ SOLN
INTRAMUSCULAR | Status: DC | PRN
  Administered 2014-11-01: 10 mg via INTRAVENOUS
  Administered 2014-11-01: 5 mg via INTRAVENOUS

## 2014-11-01 MED ORDER — MIDAZOLAM HCL 10 MG/2ML IJ SOLN
INTRAMUSCULAR | Status: AC
  Filled 2014-11-01: qty 2

## 2014-11-01 MED ORDER — GLYCOPYRROLATE 0.2 MG/ML IJ SOLN
INTRAMUSCULAR | Status: AC
  Filled 2014-11-01: qty 1

## 2014-11-01 MED ORDER — HEPARIN SODIUM (PORCINE) 1000 UNIT/ML IJ SOLN
INTRAMUSCULAR | Status: DC | PRN
  Administered 2014-11-01: 34000 [IU] via INTRAVENOUS

## 2014-11-01 MED ORDER — ACETAMINOPHEN 500 MG PO TABS
1000.0000 mg | ORAL_TABLET | Freq: Four times a day (QID) | ORAL | Status: DC
  Administered 2014-11-01 – 2014-11-04 (×8): 1000 mg via ORAL
  Filled 2014-11-01 (×16): qty 2

## 2014-11-01 MED ORDER — SODIUM CHLORIDE 0.9 % IJ SOLN
3.0000 mL | Freq: Two times a day (BID) | INTRAMUSCULAR | Status: DC
  Administered 2014-11-02 – 2014-11-03 (×3): 3 mL via INTRAVENOUS

## 2014-11-01 MED ORDER — PNEUMOCOCCAL VAC POLYVALENT 25 MCG/0.5ML IJ INJ: 0.5000 mL | INJECTION | INTRAMUSCULAR | Status: DC | PRN

## 2014-11-01 MED ORDER — INSULIN REGULAR BOLUS VIA INFUSION
0.0000 [IU] | Freq: Three times a day (TID) | INTRAVENOUS | Status: DC
  Administered 2014-11-02: 4 [IU] via INTRAVENOUS
  Filled 2014-11-01: qty 10

## 2014-11-01 MED ORDER — CETYLPYRIDINIUM CHLORIDE 0.05 % MT LIQD
7.0000 mL | Freq: Four times a day (QID) | OROMUCOSAL | Status: DC
  Administered 2014-11-01 – 2014-11-04 (×11): 7 mL via OROMUCOSAL

## 2014-11-01 MED ORDER — HEPARIN SODIUM (PORCINE) 1000 UNIT/ML IJ SOLN
INTRAMUSCULAR | Status: AC
  Filled 2014-11-01: qty 1

## 2014-11-01 MED ORDER — DEXMEDETOMIDINE HCL IN NACL 200 MCG/50ML IV SOLN: 0.0000 ug/kg/h | INTRAVENOUS | Status: DC

## 2014-11-01 MED ORDER — PANTOPRAZOLE SODIUM 40 MG PO TBEC
40.0000 mg | DELAYED_RELEASE_TABLET | Freq: Every day | ORAL | Status: DC
  Administered 2014-11-03 – 2014-11-04 (×2): 40 mg via ORAL

## 2014-11-01 MED ORDER — SODIUM CHLORIDE 0.9 % IV SOLN
INTRAVENOUS | Status: DC
  Administered 2014-11-01: 21:00:00 via INTRAVENOUS
  Filled 2014-11-01 (×2): qty 2.5

## 2014-11-01 MED ORDER — FENTANYL CITRATE 0.05 MG/ML IJ SOLN
INTRAMUSCULAR | Status: AC
  Filled 2014-11-01: qty 2

## 2014-11-01 MED ORDER — VECURONIUM BROMIDE 10 MG IV SOLR
INTRAVENOUS | Status: DC | PRN
  Administered 2014-11-01 (×2): 10 mg via INTRAVENOUS

## 2014-11-01 MED ORDER — PROPOFOL 10 MG/ML IV BOLUS
INTRAVENOUS | Status: DC | PRN
  Administered 2014-11-01: 120 mg via INTRAVENOUS

## 2014-11-01 MED ORDER — GELATIN ABSORBABLE MT POWD
OROMUCOSAL | Status: DC | PRN
  Administered 2014-11-01 (×4): 1000 mL via TOPICAL

## 2014-11-01 MED ORDER — BISACODYL 10 MG RE SUPP: 10.0000 mg | Freq: Every day | RECTAL | Status: DC

## 2014-11-01 MED ORDER — ONDANSETRON HCL 4 MG/2ML IJ SOLN: 4.0000 mg | Freq: Four times a day (QID) | INTRAMUSCULAR | Status: DC | PRN

## 2014-11-01 MED ORDER — ACETAMINOPHEN 160 MG/5ML PO SOLN: 650.0000 mg | Freq: Once | ORAL | Status: AC

## 2014-11-01 MED ORDER — MORPHINE SULFATE 2 MG/ML IJ SOLN
2.0000 mg | INTRAMUSCULAR | Status: DC | PRN
  Administered 2014-11-01 – 2014-11-02 (×3): 2 mg via INTRAVENOUS
  Filled 2014-11-01 (×4): qty 1

## 2014-11-01 MED ORDER — CHLORHEXIDINE GLUCONATE 4 % EX LIQD: 30.0000 mL | CUTANEOUS | Status: DC

## 2014-11-01 MED ORDER — MORPHINE SULFATE 2 MG/ML IJ SOLN: 1.0000 mg | INTRAMUSCULAR | Status: DC | PRN

## 2014-11-01 MED ORDER — MAGNESIUM SULFATE 4 GM/100ML IV SOLN
4.0000 g | Freq: Once | INTRAVENOUS | Status: AC
  Administered 2014-11-01: 4 g via INTRAVENOUS
  Filled 2014-11-01: qty 100

## 2014-11-01 MED ORDER — POTASSIUM CHLORIDE 10 MEQ/50ML IV SOLN
10.0000 meq | INTRAVENOUS | Status: AC
  Administered 2014-11-01 (×3): 10 meq via INTRAVENOUS

## 2014-11-01 MED ORDER — PHENYLEPHRINE HCL 10 MG/ML IJ SOLN
0.0000 ug/min | INTRAVENOUS | Status: DC
  Administered 2014-11-01: 30 ug/min via INTRAVENOUS
  Filled 2014-11-01 (×2): qty 2

## 2014-11-01 MED ORDER — LACTATED RINGERS IV SOLN
INTRAVENOUS | Status: DC | PRN
  Administered 2014-11-01 (×2): via INTRAVENOUS

## 2014-11-01 MED ORDER — TRAMADOL HCL 50 MG PO TABS: 50.0000 mg | ORAL_TABLET | ORAL | Status: DC | PRN

## 2014-11-01 MED ORDER — HEMOSTATIC AGENTS (NO CHARGE) OPTIME
TOPICAL | Status: DC | PRN
  Administered 2014-11-01: 1 via TOPICAL

## 2014-11-01 MED ORDER — ACETAMINOPHEN 160 MG/5ML PO SOLN: 1000.0000 mg | Freq: Four times a day (QID) | ORAL | Status: DC

## 2014-11-01 MED FILL — Heparin Sodium (Porcine) Inj 1000 Unit/ML: INTRAMUSCULAR | Qty: 20 | Status: AC

## 2014-11-01 MED FILL — Mannitol IV Soln 20%: INTRAVENOUS | Qty: 500 | Status: AC

## 2014-11-01 MED FILL — Electrolyte-R (PH 7.4) Solution: INTRAVENOUS | Qty: 3000 | Status: AC

## 2014-11-01 MED FILL — Lidocaine HCl IV Inj 20 MG/ML: INTRAVENOUS | Qty: 5 | Status: AC

## 2014-11-01 MED FILL — Sodium Chloride IV Soln 0.9%: INTRAVENOUS | Qty: 2000 | Status: AC

## 2014-11-01 MED FILL — Sodium Bicarbonate IV Soln 8.4%: INTRAVENOUS | Qty: 50 | Status: AC

## 2014-11-01 SURGICAL SUPPLY — 88 items
ADAPTER CARDIO PERF ANTE/RETRO (ADAPTER) ×3 IMPLANT
ATTRACTOMAT 16X20 MAGNETIC DRP (DRAPES) IMPLANT
BLADE STERNUM SYSTEM 6 (BLADE) ×3 IMPLANT
BLADE SURG 11 STRL SS (BLADE) ×3 IMPLANT
CANISTER SUCTION 2500CC (MISCELLANEOUS) ×3 IMPLANT
CANNULA EZ GLIDE AORTIC 21FR (CANNULA) ×3 IMPLANT
CANNULA GUNDRY RCSP 15FR (MISCELLANEOUS) ×3 IMPLANT
CANNULA SOFTFLOW AORTIC 7M21FR (CANNULA) ×3 IMPLANT
CANNULA SUMP PERICARDIAL (CANNULA) ×3 IMPLANT
CATH CPB KIT OWEN (MISCELLANEOUS) ×3 IMPLANT
CATH HEART VENT LEFT (CATHETERS) ×2 IMPLANT
CATH THORACIC 36FR (CATHETERS) ×3 IMPLANT
CATH THORACIC 36FR RT ANG (CATHETERS) ×3 IMPLANT
COVER SURGICAL LIGHT HANDLE (MISCELLANEOUS) ×3 IMPLANT
CRADLE DONUT ADULT HEAD (MISCELLANEOUS) ×3 IMPLANT
DRAIN CHANNEL 32F RND 10.7 FF (WOUND CARE) ×3 IMPLANT
DRAPE BILATERAL SPLIT (DRAPES) IMPLANT
DRAPE CARDIOVASCULAR INCISE (DRAPES) ×1
DRAPE CV SPLIT W-CLR ANES SCRN (DRAPES) IMPLANT
DRAPE INCISE IOBAN 66X45 STRL (DRAPES) ×6 IMPLANT
DRAPE SLUSH/WARMER DISC (DRAPES) ×3 IMPLANT
DRAPE SRG 135X102X78XABS (DRAPES) ×2 IMPLANT
DRSG COVADERM 4X14 (GAUZE/BANDAGES/DRESSINGS) ×3 IMPLANT
ELECT REM PT RETURN 9FT ADLT (ELECTROSURGICAL) ×6
ELECTRODE REM PT RTRN 9FT ADLT (ELECTROSURGICAL) ×4 IMPLANT
GAUZE SPONGE 4X4 12PLY STRL (GAUZE/BANDAGES/DRESSINGS) ×6 IMPLANT
GLOVE BIO SURGEON STRL SZ 6 (GLOVE) IMPLANT
GLOVE BIO SURGEON STRL SZ 6.5 (GLOVE) ×15 IMPLANT
GLOVE BIO SURGEON STRL SZ7 (GLOVE) IMPLANT
GLOVE BIO SURGEON STRL SZ7.5 (GLOVE) IMPLANT
GLOVE BIOGEL PI IND STRL 7.0 (GLOVE) ×4 IMPLANT
GLOVE BIOGEL PI INDICATOR 7.0 (GLOVE) ×2
GLOVE ORTHO TXT STRL SZ7.5 (GLOVE) ×9 IMPLANT
GOWN STRL REUS W/ TWL LRG LVL3 (GOWN DISPOSABLE) ×10 IMPLANT
GOWN STRL REUS W/TWL LRG LVL3 (GOWN DISPOSABLE) ×5
HEMOSTAT POWDER SURGIFOAM 1G (HEMOSTASIS) ×12 IMPLANT
INSERT FOGARTY XLG (MISCELLANEOUS) ×3 IMPLANT
KIT BASIN OR (CUSTOM PROCEDURE TRAY) ×3 IMPLANT
KIT DRAINAGE VACCUM ASSIST (KITS) ×3 IMPLANT
KIT ROOM TURNOVER OR (KITS) ×3 IMPLANT
KIT SUCTION CATH 14FR (SUCTIONS) ×12 IMPLANT
LEAD PACING MYOCARDI (MISCELLANEOUS) ×3 IMPLANT
LINE VENT (MISCELLANEOUS) ×3 IMPLANT
LOOP VESSEL SUPERMAXI WHITE (MISCELLANEOUS) ×3 IMPLANT
MASK FACE 3 LYR ANT FOG FR FLM (MASK) ×2 IMPLANT
MASK SURG ANTI FOG TAPE (MASK) ×3 IMPLANT
MASK SURG FACE ANTI FOG ADLT (MASK) ×1
NS IRRIG 1000ML POUR BTL (IV SOLUTION) ×15 IMPLANT
PACK OPEN HEART (CUSTOM PROCEDURE TRAY) ×3 IMPLANT
PAD ARMBOARD 7.5X6 YLW CONV (MISCELLANEOUS) ×6 IMPLANT
SET CARDIOPLEGIA MPS 5001102 (MISCELLANEOUS) ×3 IMPLANT
SET IRRIG TUBING LAPAROSCOPIC (IRRIGATION / IRRIGATOR) ×3 IMPLANT
SPONGE GAUZE 4X4 12PLY STER LF (GAUZE/BANDAGES/DRESSINGS) ×3 IMPLANT
SUT BONE WAX W31G (SUTURE) ×3 IMPLANT
SUT ETHIBON 2 0 V 52N 30 (SUTURE) ×6 IMPLANT
SUT ETHIBON EXCEL 2-0 V-5 (SUTURE) IMPLANT
SUT ETHIBOND 2 0 SH (SUTURE)
SUT ETHIBOND 2 0 SH 36X2 (SUTURE) IMPLANT
SUT ETHIBOND 2 0 V4 (SUTURE) IMPLANT
SUT ETHIBOND 2 0V4 GREEN (SUTURE) IMPLANT
SUT ETHIBOND 4 0 RB 1 (SUTURE) IMPLANT
SUT ETHIBOND V-5 VALVE (SUTURE) IMPLANT
SUT ETHIBOND X763 2 0 SH 1 (SUTURE) ×9 IMPLANT
SUT MNCRL AB 3-0 PS2 18 (SUTURE) ×6 IMPLANT
SUT PDS AB 1 CTX 36 (SUTURE) ×6 IMPLANT
SUT PROLENE 4 0 RB 1 (SUTURE) ×5
SUT PROLENE 4 0 SH DA (SUTURE) ×18 IMPLANT
SUT PROLENE 4-0 RB1 .5 CRCL 36 (SUTURE) ×10 IMPLANT
SUT PROLENE 5 0 C 1 36 (SUTURE) IMPLANT
SUT PROLENE 5 0 RB 2 (SUTURE) ×15 IMPLANT
SUT PROLENE 6 0 C 1 30 (SUTURE) IMPLANT
SUT SILK  1 MH (SUTURE) ×1
SUT SILK 1 MH (SUTURE) ×2 IMPLANT
SUT SILK 2 0 SH CR/8 (SUTURE) IMPLANT
SUT SILK 3 0 SH CR/8 (SUTURE) IMPLANT
SUT STEEL 6MS V (SUTURE) IMPLANT
SUT VIC AB 2-0 CTX 27 (SUTURE) IMPLANT
SUTURE E-PAK OPEN HEART (SUTURE) ×3 IMPLANT
SYSTEM SAHARA CHEST DRAIN ATS (WOUND CARE) ×3 IMPLANT
TAPE CLOTH SURG 4X10 WHT LF (GAUZE/BANDAGES/DRESSINGS) ×3 IMPLANT
TAPE PAPER 2X10 WHT MICROPORE (GAUZE/BANDAGES/DRESSINGS) ×3 IMPLANT
TOWEL OR 17X24 6PK STRL BLUE (TOWEL DISPOSABLE) ×6 IMPLANT
TOWEL OR 17X26 10 PK STRL BLUE (TOWEL DISPOSABLE) ×6 IMPLANT
TRAY FOLEY IC TEMP SENS 16FR (CATHETERS) ×3 IMPLANT
UNDERPAD 30X30 INCONTINENT (UNDERPADS AND DIAPERS) ×3 IMPLANT
VALVE PULMONARY CRYO 26MM (Valve) ×3 IMPLANT
VENT LEFT HEART 12002 (CATHETERS) ×3
WATER STERILE IRR 1000ML POUR (IV SOLUTION) ×6 IMPLANT

## 2014-11-01 NOTE — Progress Notes (Signed)
*  PRELIMINARY RESULTS* Echocardiogram Echocardiogram Transesophageal has been performed.  Leavy Cella 11/01/2014, 10:40 AM

## 2014-11-01 NOTE — Interval H&P Note (Signed)
History and Physical Interval Note:  11/01/2014 6:52 AM  Jeffrey Guerrero  has presented today for surgery, with the diagnosis of PULMONIC INSUFFIENCY  The various methods of treatment have been discussed with the patient and family. After consideration of risks, benefits and other options for treatment, the patient has consented to  Procedure(s): PULMONIC HOMOGRAFT VALVE REPLACEMENT  ([name]) TRANSESOPHAGEAL ECHOCARDIOGRAM (TEE) ([name]) as a surgical intervention .  The patient's history has been reviewed, patient examined, no change in status, stable for surgery.  I have reviewed the patient's chart and labs.  Questions were answered to the patient's satisfaction.     Jeffrey Guerrero

## 2014-11-01 NOTE — Transfer of Care (Signed)
Immediate Anesthesia Transfer of Care Note  Patient: Jeffrey Guerrero  Procedure(s) Performed: Procedure(s): PULMONIC HOMOGRAFT VALVE REPLACEMENT  ([name]) TRANSESOPHAGEAL ECHOCARDIOGRAM (TEE) ([name])  Patient Location: ICU  Anesthesia Type:General  Level of Consciousness: Patient remains intubated per anesthesia plan  Airway & Oxygen Therapy: Patient remains intubated per anesthesia plan  Post-op Assessment: Post -op Vital signs reviewed and stable and report to icu rn  Post vital signs: Reviewed and stable  Complications: No apparent anesthesia complications

## 2014-11-01 NOTE — Brief Op Note (Addendum)
11/01/2014  10:49 AM  PATIENT:  Jeffrey Guerrero  64 y.o. male  PRE-OPERATIVE DIAGNOSIS:  PULMONIC INSUFFIENCY  POST-OPERATIVE DIAGNOSIS:  PULMONIC INSUFFIENCY  PROCEDURE:  Procedure(s):  PULMONIC HOMOGRAFT VALVE REPLACEMENT  ([name]) -Size 26 HVP-L- Cryo Pulmonary Valve  TRANSESOPHAGEAL ECHOCARDIOGRAM (TEE) ([name])  SURGEON:    Rexene Alberts, MD  ASSISTANTS:  Ellwood Handler, PA-C and Lemar Lofty, PA-S  ANESTHESIA:   Albertha Ghee, MD  CROSSCLAMP TIME:   3'  CARDIOPULMONARY BYPASS TIME: 47'  FINDINGS:  Type II dysfunction of pulmonic valve due to previous endocarditis  No vegetations or other signs of active infection  Normal LV and RV systolic function   Pulmonary Etiology  Pulmonic Insufficiency: Severe.  Pulmonic Disease: No.  Pulmonic Stenosis: No Pulmonic Stenosis.  Etiology (Choose one): Other. Endocarditis  Mitral/Tricuspid/Pulmonary Valve Procedure  Pulmonary Valve Procedure Performed: Replacement  Implant: Homograft; , Size 26, Unique Device Identifier [id number]      COMPLICATIONS: None  BASELINE WEIGHT: 87.5 kg  PATIENT DISPOSITION:   TO SICU IN STABLE CONDITION  Abisola Carrero H 11/01/2014 12:02 PM

## 2014-11-01 NOTE — Progress Notes (Signed)
RT note-attempt to wean to IMV 4, low VE, placed back to a rate of 12.

## 2014-11-01 NOTE — Op Note (Signed)
CARDIOTHORACIC SURGERY OPERATIVE NOTE  Date of Procedure:  11/01/2014  Preoperative Diagnosis: Severe Pulmonic Insufficiency  Postoperative Diagnosis: Same   Procedure:    Homograft Pulmonic Valve Replacement    [employer] Human Allograft Pulmonary Valve (size 26 mm, ID# [id number])   Surgeon: Valentina Gu. Roxy Manns, MD  Assistant: Ellwood Handler, PA-C and Lemar Lofty, PA-S  Anesthesia: Albertha Ghee, MD  Operative Findings:  Type II dysfunction of pulmonic valve due to previous endocarditis  No vegetations or other signs of active infection  Normal LV and RV systolic function          BRIEF CLINICAL NOTE AND INDICATIONS FOR SURGERY  Patient is a 64 year old previously healthy African American male who presented acutely this past spring on 02/12/2014 in septic shock after he suffered a syncopal episode of home with fevers greater than 103F. Blood cultures obtained at the time of admission grew Streptococcus pneumoniae. Transesophageal echocardiogram demonstrated the presence of vegetations on the pulmonic valve with at least moderate pulmonic insufficiency and CT angiogram of the chest revealed multiple pulmonary emboli consistent with septic emboli in both lungs. The patient was treated with a six-week course of intravenous Rocephin and anticoagulated using Xarelto. Followup blood cultures obtained after stopping antibiotics were negative. The patient has been followed carefully since that time by Dr. [name] who performed repeat transesophageal echocardiogram in June. At that time there was no sign of any residual vegetation on the pulmonic valve, but there was severe pulmonic insufficiency and mild to moderate right ventricular dysfunction. Over time the patient developed progressive RV chamber enlargement although he has remained asymptomatic.  The patient has been seen in consultation and counseled at length regarding the indications, risks and potential benefits of surgery.   All questions have been answered, and the patient provides full informed consent for the operation as described.    DETAILS OF THE OPERATIVE PROCEDURE  Preparation:  The patient is brought to the operating room on the above mentioned date and central monitoring was established by the anesthesia team including placement of Swan-Ganz catheter and radial arterial line. The patient is placed in the supine position on the operating table.  Intravenous antibiotics are administered. General endotracheal anesthesia is induced uneventfully. A Foley catheter is placed.  Baseline transesophageal echocardiogram was performed.  Findings were notable for severe pulmonic insufficiency.  The patient's chest, abdomen, both groins, and both lower extremities are prepared and draped in a sterile manner. A time out procedure is performed.   Surgical Approach:  A median sternotomy incision was performed and the pericardium is opened. The ascending aorta is normal in appearance.  The size of the pulmonary arteries measured and a 26 mm human allograft pulmonic valve is thought and prepared for implantation per protocol.   Extracorporeal Cardiopulmonary Bypass and Myocardial Protection:  The ascending aorta and the right atrium are cannulated for cardiopulmonary bypass.  Adequate heparinization is verified.   The entire pre-bypass portion of the operation was notable for stable hemodynamics.  Cardiopulmonary bypass was begun and the surface of the heart is inspected.  The pulmonary artery is separated from the ascending aorta.  A cardioplegia cannula is placed in the ascending aorta.  The patient is allowed to cool passively to [facility] systemic temperature.  The aortic cross clamp is applied and cold blood cardioplegia is delivered initially in an antegrade fashion through the aortic root.    Iced saline slush is applied for topical hypothermia.  The initial cardioplegic arrest is rapid with early diastolic arrest.  Repeat doses of cardioplegia are administered intermittently throughout the entire cross clamp portion of the operation through the aortic root in order to maintain completely flat electrocardiogram.  Myocardial protection was felt to be excellent.   Homograft Pulmonic Valve Replacement:  Dissection is begun proximally to separate the proximal aortic root from the right ventricular outflow tract. The left main coronary artery is identified. The pulmonary artery is transected just above the pulmonic valve. The pulmonic valve is inspected. One of the 3 leaflets is completely disrupted. There are no vegetations or other signs to suggest active infection. The pulmonic trunk is transected proximally just below the pulmonic valve annulus.  The homograft pulmonic valve is inspected carefully. The 3 leaflets of the valve appear normal and there are no fenestrations. The proximal and distal end of the homograft are both trimmed for implantation. The distal suture line is subsequent performed using running 5-0 Prolene suture. The proximal suture line is performed using running 4-0 Prolene suture.   Procedure Completion:  One final dose of warm "hot shot" cardioplegia was administered antegrade through the aortic root.  The aortic cross clamp was removed after a total cross clamp time of 48 minutes.  Epicardial pacing wires are fixed to the right ventricular outflow tract and to the right atrial appendage. The patient is rewarmed to 37C temperature. The aortic vent was removed.  The patient is weaned and disconnected from cardiopulmonary bypass.  The patient's rhythm at separation from bypass was AV paced.  The patient was weaned from cardioplegic bypass without any inotropic support. Total cardiopulmonary bypass time for the operation was 66 minutes.  Followup transesophageal echocardiogram performed after separation from bypass revealed no residual pulmonic insufficiency.  Left ventricular function was  unchanged from preoperatively.  The aortic and venous cannula were removed uneventfully. Protamine was administered to reverse the anticoagulation. The mediastinum and pleural space were inspected for hemostasis and irrigated with saline solution. The mediastinum was drained using 2 chest tubes placed through separate stab incisions inferiorly.  The soft tissues anterior to the aorta were reapproximated loosely. The sternum is closed with double strength sternal wire. The soft tissues anterior to the sternum were closed in multiple layers and the skin is closed with a running subcuticular skin closure.  The post-bypass portion of the operation was notable for stable rhythm and hemodynamics.  No blood products were administered during the operation.   Disposition:  The patient tolerated the procedure well and is transported to the surgical intensive care in stable condition. There are no intraoperative complications. All sponge instrument and needle counts are verified correct at completion of the operation.    Valentina Gu. Roxy Manns MD 11/01/2014 12:04 PM

## 2014-11-01 NOTE — OR Nursing (Signed)
11:15 - 1st call to SICU charge nurse 

## 2014-11-01 NOTE — Anesthesia Preprocedure Evaluation (Signed)
Anesthesia Evaluation  Patient identified by MRN, date of birth, ID band Patient awake    Reviewed: Allergy & Precautions, NPO status , Patient's Chart, lab work & pertinent test results  Airway Mallampati: II   Neck ROM: full    Dental   Pulmonary neg pulmonary ROS,          Cardiovascular hypertension, + Valvular Problems/Murmurs  Pulmonic valve insufficiency.  H/o bacterial endocarditis 02/2014.  S/p antibiotic treatment.   Neuro/Psych    GI/Hepatic   Endo/Other  diabetes, Type 2Hypothyroidism   Renal/GU      Musculoskeletal  (+) Arthritis -, Osteoarthritis,    Abdominal   Peds  Hematology   Anesthesia Other Findings   Reproductive/Obstetrics                             Anesthesia Physical Anesthesia Plan  ASA: III  Anesthesia Plan: General   Post-op Pain Management:    Induction: Intravenous  Airway Management Planned: Oral ETT  Additional Equipment: Arterial line, CVP, PA Cath, TEE and Ultrasound Guidance Line Placement  Intra-op Plan:   Post-operative Plan: Possible Post-op intubation/ventilation  Informed Consent: I have reviewed the patients History and Physical, chart, labs and discussed the procedure including the risks, benefits and alternatives for the proposed anesthesia with the patient or authorized representative who has indicated his/her understanding and acceptance.     Plan Discussed with: CRNA, Anesthesiologist and Surgeon  Anesthesia Plan Comments:         Anesthesia Quick Evaluation

## 2014-11-01 NOTE — Procedures (Signed)
Extubation Procedure Note  Patient Details:   Name: Jeffrey Guerrero DOB: December 06, 1950 MRN: [id number]   Airway Documentation:     Evaluation  O2 sats: stable throughout Complications: No apparent complications Patient did tolerate procedure well. Bilateral Breath Sounds: Clear   Yes  2015ml on IS instruction 4l/min [place] Vocalizes well.  Revonda Standard 11/01/2014, 5:07 PM

## 2014-11-01 NOTE — Anesthesia Postprocedure Evaluation (Signed)
  Anesthesia Post-op Note  Patient: Jeffrey Guerrero  Procedure(s) Performed: Procedure(s): PULMONIC HOMOGRAFT VALVE REPLACEMENT  ([name]) TRANSESOPHAGEAL ECHOCARDIOGRAM (TEE) ([name])  Patient Location: ICU  Anesthesia Type:General  Level of Consciousness: sedated  Airway and Oxygen Therapy: Patient remains intubated per anesthesia plan  Post-op Pain: none  Post-op Assessment: Post-op Vital signs reviewed, Patient's Cardiovascular Status Stable and Respiratory Function Stable  Post-op Vital Signs: Reviewed and stable  Last Vitals:  Filed Vitals:   11/01/14 1615  BP:   Pulse: 80  Temp: 36.7 C  Resp: 21    Complications: No apparent anesthesia complications

## 2014-11-01 NOTE — H&P (Signed)
[address]       [place], [address]             [phone]          CARDIOTHORACIC SURGERY HISTORY AND PHYSICAL EXAM   Referring Provider is [name], MD PCP is [name], MD    Chief Complaint   Patient presents with   .  Cardiac Valve Problem       severe pulmonic insufficiency     HPI:  Patient is a 64 year old previously healthy African American male who presented acutely this past spring on 02/12/2014 in septic shock after he suffered a syncopal episode of home with fevers greater than 103F.  Blood cultures obtained at the time of admission grew Streptococcus pneumoniae.  Transesophageal echocardiogram demonstrated the presence of vegetations on the pulmonic valve with at least moderate pulmonic insufficiency and CT angiogram of the chest revealed multiple pulmonary emboli consistent with septic emboli in both lungs.  The patient was treated with a six-week course of intravenous Rocephin and anticoagulated using Xarelto. Followup blood cultures obtained after stopping antibiotics were negative.  The patient has been followed carefully since that time by Dr. [name] who performed repeat transesophageal echocardiogram in June. At that time there was no sign of any residual vegetation on the pulmonic valve, but there was severe pulmonic insufficiency and mild to moderate right ventricular dysfunction. No other significant abnormalities were noted.  The patient was referred to discuss treatment options for management of severe pulmonic valve insufficiency.  He was originally seen in consultation on 06/18/2014 and he was seen more recently on 09/24/2014 at which time we made plans to proceed with human allograft pulmonic valve replacement. Since then the patient underwent diagnostic cardiac catheterization on 10/09/2014. This was notable for the presence of minimal nonobstructive coronary artery disease and normal left ventricular systolic function. Right  heart catheterization was not performed.  The patient returns to the office today with plans to proceed with surgery on 11/01/2014. He reports no new problems or complaints over the last few weeks. He specifically denies any problems with shortness of breath, cough, fevers, chills, or chest pain.  The patient is married and lives with his wife in [place]. He works full-time as a Building control surveyor. This requires fairly strenuous physical activity.  He states that it took approximately 2-3 months for him to fully recover from his illness. He states that he is now back to baseline and free of any symptoms. He specifically denies any symptoms of exertional shortness of breath, fevers, chills, or chest discomfort.  His review of systems is otherwise unremarkable.    Past Medical History  Diagnosis Date  . Hypertension   . Thyroid disease   . Hypothyroidism   . Arthritis     "knees" (02/13/2014)  . 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 Pulmonic valve insufficiency 02/16/2014    severe  . Diabetes mellitus without complication     takes metformin    Past Surgical History  Procedure Laterality Date  . Tonsillectomy    . Appendectomy    . Tee without cardioversion [name] 02/16/2014    Procedure: TRANSESOPHAGEAL ECHOCARDIOGRAM (TEE);  Surgeon: [name], MD;  Location: [facility] ENDOSCOPY;  Service: Cardiovascular;  Laterality: [name];  . Tee without cardioversion [name] 04/12/2014  Procedure: TRANSESOPHAGEAL ECHOCARDIOGRAM (TEE);  Surgeon: [name], MD;  Location: [facility] ENDOSCOPY;  Service: Cardiovascular;  Laterality: [name];  . Left heart catheterization with coronary angiogram [name] 10/09/2014    Procedure: LEFT HEART CATHETERIZATION WITH CORONARY ANGIOGRAM;  Surgeon: Jettie Booze, MD;  Location: [facility] CATH LAB;  Service:  Cardiovascular;  Laterality: [name];  . Colonoscopy      History reviewed. No pertinent family history.  Social History History  Substance Use Topics  . Smoking status: Never Smoker   . Smokeless tobacco: Never Used  . Alcohol Use: 1.2 oz/week    2 Cans of beer per week     Comment: occasional    Prior to Admission medications   Medication Sig Start Date End Date Taking? Authorizing Provider  amLODipine (NORVASC) 10 MG tablet Take 10 mg by mouth daily.   Yes Historical Provider, MD  aspirin EC 81 MG tablet Take 81 mg by mouth daily.   Yes Historical Provider, MD  levothyroxine (SYNTHROID, LEVOTHROID) 100 MCG tablet Take 100 mcg by mouth daily before breakfast.   Yes Historical Provider, MD  metFORMIN (GLUCOPHAGE) 500 MG tablet Take 1 tablet (500 mg total) by mouth 2 (two) times daily. 10/11/14 10/11/15 Yes Jettie Booze, MD    No Known Allergies   Review of Systems:              General:                      normal appetite, normal energy, no weight gain, no weight loss, no fever             Cardiac:                      no chest pain with exertion, no chest pain at rest, no SOB with exertion, no resting SOB, no PND, no orthopnea, no palpitations, no arrhythmia, no atrial fibrillation, no LE edema, no dizzy spells, no syncope             Respiratory:                no shortness of breath, no home oxygen, no productive cough, no dry cough, no bronchitis, no wheezing, no hemoptysis, no asthma, no pain with inspiration or cough, no sleep apnea, no CPAP at night             GI:                                no difficulty swallowing, no reflux, no frequent heartburn, no hiatal hernia, no abdominal pain, no constipation, no diarrhea, no hematochezia, no hematemesis, no melena             GU:                              no dysuria,  no frequency, no urinary tract infection, no hematuria, no enlarged prostate, no kidney stones, no kidney disease             Vascular:                      no pain suggestive of claudication, no pain in feet, no leg cramps, no varicose veins, no DVT, no non-healing foot ulcer  Neuro:                         no stroke, no TIA's, no seizures, no headaches, no temporary blindness one eye,  no slurred speech, no peripheral neuropathy, + occasional transient numbness in thumb and long finger right hand since PICC line, no chronic pain, no instability of gait, no memory/cognitive dysfunction             Musculoskeletal:         + arthritis in both knees, no joint swelling, no myalgias, no difficulty walking, normal mobility               Skin:                            no rash, no itching, no skin infections, no pressure sores or ulcerations             Psych:                         no anxiety, no depression, no nervousness, no unusual recent stress             Eyes:                           no blurry vision, no floaters, no recent vision changes, + wears glasses or contacts             ENT:                            no hearing loss, no loose or painful teeth, edentulous but no dentures, last saw dentist many years ago             Hematologic:               no easy bruising, no abnormal bleeding, no clotting disorder, no frequent epistaxis             Endocrine:                   no diabetes, does not check CBG's at home                           Physical Exam:              BP 154/84  Pulse 59  Resp 16  SpO2 98%             General:                        well-appearing             HEENT:                       Unremarkable               Neck:                           no JVD, no bruits, no adenopathy               Chest:  clear to auscultation, symmetrical breath sounds, no wheezes, no rhonchi               CV:                              RRR, to and frow grade III/VI diatolic murmur best LUSB             Abdomen:                    soft, non-tender, no masses               Extremities:                 warm,  well-perfused, pulses , no LE edema             Rectal/GU                   Deferred             Neuro:                         Grossly non-focal and symmetrical throughout             Skin:                            Clean and dry, no rashes, no breakdown   Diagnostic Tests:  Transthoracic Echocardiography  Patient: Khyree, Carillo MR #: [id number] Study Date: 02/13/2014 Gender: M Age: 64 Height: 185.4cm Weight: 87.1kg BSA: 2.14m^2 Pt. Status: Room: 2M13C  Morey Hummingbird ATTENDING Ramaswamy, Murali ORDERING Chase Caller, Murali REFERRING [place], [place] SONOGRAPHER Donata Clay PERFORMING [facility], Inpatient cc:  ------------------------------------------------------------ LV EF: 45% - 50%  ------------------------------------------------------------ Indications: CHF 428.21.  ------------------------------------------------------------ Study Conclusions  - Left ventricle: The cavity size was mildly dilated. Wall thickness was normal. Systolic function was mildly reduced. The estimated ejection fraction was in the range of 45% to 50%. Diffuse hypokinesis. - Right ventricle: The cavity size was moderately dilated. - Right atrium: The atrium was moderately dilated. - Atrial septum: No defect or patent foramen ovale was identified. Transthoracic echocardiography. M-mode, complete 2D, spectral Doppler, and color Doppler. Height: Height: 185.4cm. Height: 73in. Weight: Weight: 87.1kg. Weight: 191.6lb. Body mass index: BMI: 25.3kg/m^2. Body surface area: BSA: 2.32m^2. Blood pressure: 99/60. Patient status: Inpatient. Location: ICU.  ------------------------------------------------------------  ------------------------------------------------------------ Left ventricle: The cavity size was mildly dilated. Wall thickness was normal. Systolic function was mildly reduced. The estimated ejection fraction was in the range of 45% to 50%. Diffuse  hypokinesis.  ------------------------------------------------------------ Aortic valve: Mildly thickened leaflets. Doppler: There was no stenosis.  ------------------------------------------------------------ Mitral valve: Mildly thickened leaflets . Doppler: No significant regurgitation.  ------------------------------------------------------------ Left atrium: The atrium was normal in size.  ------------------------------------------------------------ Atrial septum: No defect or patent foramen ovale was identified.  ------------------------------------------------------------ Right ventricle: The cavity size was moderately dilated.  ------------------------------------------------------------ Pulmonic valve: Structurally normal valve. Cusp separation was normal. Doppler: Transvalvular velocity was within the normal range. Trivial regurgitation.  ------------------------------------------------------------ Tricuspid valve: Structurally normal valve. Leaflet separation was normal. Doppler: Transvalvular velocity was within the normal range. Mild regurgitation.  ------------------------------------------------------------ Right atrium: The atrium was moderately dilated.  ------------------------------------------------------------ Pericardium: The pericardium was normal in appearance.  ------------------------------------------------------------  2D measurements Normal Doppler measurements Normal Left ventricle Left ventricle LVID ED, 55.8 mm 43-52 Ea, lat ann, 8.2  cm/s ------ chord, tiss DP 2 PLAX E/Ea, lat 8.1 ------ LVID ES, 43.8 mm 23-38 ann, tiss DP chord, Ea, med ann, 7.4 cm/s ------ PLAX tiss DP 6 FS, chord, 22 % >29 E/Ea, med 8.9 ------ PLAX ann, tiss DP 3 LVPW, ED 10.5 mm ------ Mitral valve IVS/LVPW 0.98 <1.3 Peak E vel 66. cm/s ------ ratio, ED 6 Vol ED, 164 ml ------ Peak A vel 73. cm/s ------ MOD1 1 Vol ES, 93 ml ------ Deceleration 176 ms  150-23 MOD1 time 0 EF, MOD1 43 % ------ Peak E/A 0.9 ------ Vol index, 78 ml/m^2 ------ ratio ED, MOD1 Systemic veins Vol index, 44 ml/m^2 ------ Estimated CVP 3 mm ------ ES, MOD1 Hg Vol ED, 146 ml ------ Right ventricle MOD2 Sa vel, lat 11. cm/s ------ Vol ES, 86 ml ------ ann, tiss DP 6 MOD2 EF, MOD2 41 % ------ Stroke 60 ml ------ vol, MOD2 Vol index, 69 ml/m^2 ------ ED, MOD2 Vol index, 41 ml/m^2 ------ ES, MOD2 Stroke 28.4 ml/m^2 ------ index, MOD2 Ventricular septum IVS, ED 10.3 mm ------ Aorta Root diam, 31 mm ------ ED Left atrium AP dim 35 mm ------ AP dim 1.66 cm/m^2 <2.2 index  ------------------------------------------------------------ Prepared and Electronically Authenticated by  Jenkins Rouge 2015-04-28T15:31:49.797    Transesophageal Echocardiography  Patient:    Birch, Farino MR #:       [id number] Study Date: 02/16/2014 Gender:     M Age:        64 Height:     185.4cm Weight:     95.5kg BSA:        2.35m^2 Pt. Status: Room:       [place]  ADMITTING    Ramaswamy, Murali  ATTENDING    Elmahi, Mutaz  SONOGRAPHER  Mauricio Po, RDCS, CCT cc:  ------------------------------------------------------------  ------------------------------------------------------------ Indications:      Endocarditis 421.9, suspected, pre-procedure.  ------------------------------------------------------------ Study Conclusions  - Pulmonic valve: PV is difficult to see. IT appears   thickened Question dysplastic Images 42 and 43 show   echodensity consistent with vegetation. Difficult to   image.   Moderate PI. - Pulmonary arteries: Large mobile mass seen in R pulmonary   artery. Cannot see attachment. It ias echolucent center,   Question necrotic ? vegetation. ? thrombus. Recomm CTA of   lungs. Hospitalist notified of findings. Transesophageal echocardiography.  2D and color Doppler. Height:   Height: 185.4cm. Height: 73in.  Weight:  Weight: 95.5kg. Weight: 210lb.  Body mass index:  BMI: 27.8kg/m^2. Body surface area:    BSA: 2.47m^2.  Blood pressure: 167/98.  Patient status:  Inpatient.  Location:  Endoscopy.   ------------------------------------------------------------  ------------------------------------------------------------ Aortic valve:  AV is mildly thickened. No AI.  ------------------------------------------------------------ Aorta:  Minimal atherosclerotic streaking in aorta.  ------------------------------------------------------------ Mitral valve:  MV is normal. Trace MR.  ------------------------------------------------------------ Left atrium:   No evidence of thrombus in the atrial cavity or appendage.  ------------------------------------------------------------ Pulmonic valve:   PV is difficult to see. IT appears thickened Question dysplastic Images 42 and 43 show echodensity consistent with vegetation. Difficult to image. Moderate PI.  ------------------------------------------------------------ Tricuspid valve:  TV is normal Trace TR>  ------------------------------------------------------------ Pulmonary artery:   Large mobile mass seen in R pulmonary artery. Cannot see attachment. It ias echolucent center, Question necrotic ? vegetation. ? thrombus. Recomm CTA of lungs. Hospitalist notified of findings.   ------------------------------------------------------------ Post procedure conclusions Ascending Aorta:  - Minimal atherosclerotic  streaking in aorta.  ------------------------------------------------------------ Prepared and Electronically Authenticated by  Dorris Carnes 2015-05-01T18:24:27.903    Transesophageal Echocardiography  (Report amended )  Patient:    Sharon, Stapel MR #:       [id number] Study Date: 04/12/2014 Gender:     M Age:        6 Height:     185.4 cm Weight:     95.5 kg BSA:        2.23 m^2 Pt.  Status: Room:   Daleen Squibb, M.D.  PERFORMING   Dorris Carnes, M.D.  PERFORMING   Danise Mina  SONOGRAPHER  Donata Clay  cc:  -------------------------------------------------------------------  ------------------------------------------------------------------- Indications:      Endocarditis 421.9.                   Diagnostic transesophageal echocardiography.  2D and color Doppler. Birthdate:  Patient birthdate: Nov 18, 1950.  Age:  Patient is 63 yr old.  Sex:  Gender: male.  Height:  Height: 185.4 cm. Height: 73 in.  Weight:  Weight: 95.5 kg. Weight: 210.1 lb.  Body mass index: BMI: 27.8 kg/m^2.  Body surface area:    BSA: 2.23 m^2.  Blood pressure:     123/78  Patient status:  Outpatient.  Study date: Study date: 04/12/2014. Study time: 07:50 AM.  Location: Endoscopy.  -------------------------------------------------------------------  ------------------------------------------------------------------- Left ventricle:  LVEF is grossly normal. LVEF is normal.  ------------------------------------------------------------------- Aortic valve:  AV is normal. No AI.  ------------------------------------------------------------------- Mitral valve:  MV is normal . Trace MR.  ------------------------------------------------------------------- Left atrium:   No evidence of thrombus in the atrial cavity or appendage.  ------------------------------------------------------------------- Right ventricle:  RV is mildly dilated. RVEF is mid to moderately depressed.  ------------------------------------------------------------------- Pulmonic valve:   PV has shaggy appearance, appears flail. PI is severe. Compared to previous TEE, mass is no longer seen at [facility].   ------------------------------------------------------------------- Pulmonary artery:   No masses in PA or its branches.   ------------------------------------------------------------------- Vilinda Blanks, M.D. 2015-07-15T15:29:32  Transthoracic Echocardiography  Patient:    Torence, Palmeri MR #:       [id number] Study Date: 09/05/2014 Gender:     M Age:        47 Height:     185.4 cm Weight:     88.9 kg BSA:        2.15 m^2 Pt. Status: Room:   SONOGRAPHER  Victorio Palm, RDCS  ATTENDING    Darylene Price, M.D.  ORDERING     Darylene Price, M.D.  REFERRING    Darylene Price, M.D.  PERFORMING   [facility], Outpatient  cc:  ------------------------------------------------------------------- LV EF: 55% -   60%  ------------------------------------------------------------------- Indications:      Bacterial endocarditis (I33.0).  Pulmonary insuffiency (I37.1).  ------------------------------------------------------------------- History:   PMH:  Acquired from the patient and from the patient&'s chart. 2/6 Systolic murmur and 1/6 diastolic murmur.  Severe pulmonic regurgitation.  Known endocarditis. The causative organism is bacterial.  Pulmonary embolic disease.  Risk factors: Hypertension.  ------------------------------------------------------------------- Study Conclusions  - Left ventricle: The cavity size was normal. Wall thickness was   normal. Systolic function was normal. The estimated ejection   fraction was in the range of 55% to 60%. Wall motion was normal;   there were no regional wall motion abnormalities. - Right atrium: The atrium was moderately dilated. - Pulmonary arteries: Systolic pressure was mildly increased. PA   peak pressure: 31 mm Hg (S).  ------------------------------------------------------------------- Labs, prior tests, procedures,  and surgery: Echocardiography (April 2015).    The pulmonic valve showed severe regurgitation.  EF was 50%. Moderate RV and RA enlargement.  Transthoracic echocardiography.  M-mode, complete 2D, spectral Doppler, and color Doppler.  Birthdate:  Patient birthdate: May 06, 1951.  Age:  Patient is 63 yr old.  Sex:  Gender:  male. BMI: 25.9 kg/m^2.  Blood pressure:     125/75  Patient status: Outpatient.  Study date:  Study date: 09/05/2014. Study time: 02:23 PM.  Location:  [place] [facility]  -------------------------------------------------------------------  ------------------------------------------------------------------- Left ventricle:  The global longitudinal strain is -22%.  The cavity size was normal. Wall thickness was normal. Systolic function was normal. The estimated ejection fraction was in the range of 55% to 60%. Wall motion was normal; there were no regional wall motion abnormalities.  ------------------------------------------------------------------- Aortic valve:   Structurally normal valve.   Cusp separation was normal.  Doppler:  Transvalvular velocity was within the normal range. There was no stenosis. There was no regurgitation.  ------------------------------------------------------------------- Aorta:  Aortic root: The aortic root was normal in size. Ascending aorta: The ascending aorta was normal in size.  ------------------------------------------------------------------- Mitral valve:   Structurally normal valve.   Leaflet separation was normal.  Doppler:  Transvalvular velocity was within the normal range. There was no evidence for stenosis. There was trivial regurgitation.    Peak gradient (D): 2 mm Hg.  ------------------------------------------------------------------- Left atrium:  The atrium was normal in size.  ------------------------------------------------------------------- Right ventricle:  The cavity size was normal. Systolic function was normal.  ------------------------------------------------------------------- Pulmonic valve:    The valve appears to be grossly normal. Doppler:  There was trivial regurgitation.  ------------------------------------------------------------------- Tricuspid valve:   Structurally normal valve.   Leaflet separation was  normal.  Doppler:  Transvalvular velocity was within the normal range. There was trivial regurgitation.  ------------------------------------------------------------------- Pulmonary artery:   Systolic pressure was mildly increased.  ------------------------------------------------------------------- Right atrium:  The atrium was moderately dilated.  ------------------------------------------------------------------- Pericardium:  There was no pericardial effusion.  ------------------------------------------------------------------- Systemic veins: Inferior vena cava: The vessel was normal in size. The respirophasic diameter changes were in the normal range (>= 50%), consistent with normal central venous pressure.  ------------------------------------------------------------------- Measurements   Left ventricle                              Value        Reference  LV ID, ED, PLAX chordal                     44.4  mm     43 - 52  LV ID, ES, PLAX chordal                     29.3  mm     23 - 38  LV fx shortening, PLAX chordal              34    %      >=29  LV PW thickness, ED                         11.2  mm     ---------  IVS/LV PW ratio, ED                         0.94         <=1.3  Stroke volume, 2D  90    ml     ---------  Stroke volume/bsa, 2D                       42    ml/m^2 ---------  LV e&', lateral                              10.7  cm/s   ---------  LV E/e&', lateral                            7.29         ---------  LV e&', medial                               8.55  cm/s   ---------  LV E/e&', medial                             9.12         ---------  LV e&', average                              9.63  cm/s   ---------  LV E/e&', average                            8.1          ---------    Ventricular septum                          Value        Reference  IVS thickness, ED                           10.5  mm     ---------    LVOT                                         Value        Reference  LVOT ID, S                                  22    mm     ---------  LVOT area                                   3.8   cm^2   ---------  LVOT ID                                     22    mm     ---------  LVOT peak velocity, S                       107   cm/s   ---------  LVOT mean velocity, S  72.8  cm/s   ---------  LVOT VTI, S                                 23.8  cm     ---------  Stroke volume (SV), LVOT DP                 90.5  ml     ---------  Stroke index (SV/bsa), LVOT DP              42.1  ml/m^2 ---------    Aorta                                       Value        Reference  Aortic root ID, ED                          35    mm     ---------    Left atrium                                 Value        Reference  LA ID, A-P, ES                              44    mm     ---------  LA ID/bsa, A-P                              2.05  cm/m^2 <=2.2  LA volume, S                                23    ml     ---------  LA volume/bsa, S                            10.7  ml/m^2 ---------  LA volume, ES, 1-p A4C                      25    ml     ---------  LA volume/bsa, ES, 1-p A4C                  11.6  ml/m^2 ---------  LA volume, ES, 1-p A2C                      22    ml     ---------  LA volume/bsa, ES, 1-p A2C                  10.2  ml/m^2 ---------    Mitral valve                                Value        Reference  Mitral E-wave peak velocity                 78    cm/s   ---------  Mitral A-wave peak velocity                 81.9  cm/s   ---------  Mitral deceleration time                    215   ms     150 - 230  Mitral peak gradient, D                     2     mm Hg  ---------  Mitral E/A ratio, peak                      1            ---------    Pulmonary arteries                          Value        Reference  PA pressure, S, DP                  (H)     31    mm Hg  <=30    Tricuspid valve                              Value        Reference  Tricuspid regurg peak velocity              265   cm/s   ---------  Tricuspid peak RV-RA gradient               28    mm Hg  ---------    Systemic veins                              Value        Reference  Estimated CVP                               3     mm Hg  ---------    Right ventricle                             Value        Reference  RV pressure, S, DP                  (H)     31    mm Hg  <=30  RV s&', lateral, S                           19.4  cm/s   ---------  RV myocardial performance index,            0.45         ---------  TDI    Pulmonic valve                              Value        Reference  Pulmonic valve peak velocity, S             148   cm/s   ---------  Legend: (L)  and  (H)  mark values outside specified reference range.  ------------------------------------------------------------------- Prepared and Electronically Authenticated by  Mertie Moores, M.D. 2015-11-18T17:14:11   CARDIAC CATHETERIZATION   PROCEDURE:  Left heart catheterization with selective coronary angiography, left ventriculogram.  INDICATIONS:  Preoperative eval, severe pulmonic regurgitation  The risks, benefits, and details of the procedure were explained to the patient.  The patient verbalized understanding and wanted to proceed.  Informed written consent was obtained.  PROCEDURE TECHNIQUE:  After Xylocaine anesthesia a 20F slender sheath was placed in the right radial artery with a single anterior needle wall stick.   IV Heparin was given.  Right coronary angiography was done using a Judkins R4 guide catheter.  Left coronary angiography was done using a Judkins L3.5 guide catheter.  Left ventriculography was done using a pigtail catheter.  A TR band was used for hemostasis.    CONTRAST:  Total of 75 cc.  COMPLICATIONS:  None.    HEMODYNAMICS:  Aortic pressure was 100/55; LV pressure was 103/1; LVEDP 8.  There was no gradient between the left  ventricle and aorta.    ANGIOGRAPHIC DATA:   The left main coronary artery is widely patent.  The left anterior descending artery is a large vessel which reaches the apex. There is minimal, diffuse atherosclerosis. There is no hemodynamically significant disease.   There is a large first diagonal which is widely patent.  The left circumflex artery is a large vessel. The first obtuse marginal is large and branches across lateral wall. There is minimal atherosclerosis in the circumflex system without hemodynamically significant disease.   The right coronary artery is a large, dominant vessel. There is minimal, diffuse atherosclerosis in the RCA. The posterior lateral artery is small but patent. The posterior descending artery is a large, long vessel which reaches the apex. There is no hemodynamic significant disease in the RCA system.   LEFT VENTRICULOGRAM:  Left ventricular angiogram was done in the 30 RAO projection and revealed normal left ventricular wall motion and systolic function with an estimated ejection fraction of 60%.  LVEDP was 8 mmHg.  IMPRESSIONS:    1. Widely patent left main coronary artery. 2. Minimal atherosclerotic disease in the  left anterior descending artery and its branches. 3. Minimal atherosclerotic disease in the  left circumflex artery and its branches. 4. Mild disease in the  right coronary artery. 5. Normal left ventricular systolic function.  LVEDP 8 mmHg.  Ejection fraction 60%.  RECOMMENDATION:  Continue with plans for pulmonic valve replacement per Dr. Roxy Manns and Dr. [name].  The patient will be discharged later today.                  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  He has now recovered completely from the endocarditis. Most recent followup transesophageal echocardiogram confirmed the  presence of severe pulmonic valve insufficiency and mild to moderate right ventricular dysfunction.  Cardiac catheterization is notable for the absence of significant coronary artery disease.  Options include close observation versus elective surgical replacement, with human allograft pulmonic valve replacement likely the best surgical option.      Plan:  The patient and his wife were again counseled at length regarding the indications, risks and potential benefits of surgery.  Alternative treatment strategies were discussed. They understand and accept all potential risks of surgery including but not limited to risk of death,  stroke or other neurologic complication, myocardial infarction, congestive heart failure, respiratory failure, renal failure, bleeding requiring transfusion and/or reexploration, arrhythmia, infection or other wound complications, pneumonia, pleural and/or pericardial effusion, pulmonary embolus, aortic dissection or other major vascular complication, or delayed complications related to valve repair or replacement including but not limited to structural valve deterioration and failure, thrombosis, embolization, or endocarditis.  All of their questions have been answered.   Valentina Gu. Roxy Manns, MD 10/29/2014 6:48 PM

## 2014-11-01 NOTE — Plan of Care (Signed)
Problem: Phase I - Pre-Op Goal: Point person for discharge identified Outcome: Completed/Met Date Met:  11/01/14 Wife Ophelia Dedham

## 2014-11-01 NOTE — Progress Notes (Signed)
S/p homograft pulmonic valve replacement  Waking up - checking weaning parameters  BP 139/88 mmHg  Pulse 80  Temp(Src) 98.1 F (36.7 C) (Oral)  Resp 21  Ht 6\' 1"  (1.854 m)  Wt 193 lb (87.544 kg)  BMI 25.47 kg/m2  SpO2 100%   Intake/Output Summary (Last 24 hours) at 11/01/14 1655 Last data filed at 11/01/14 1600  Gross per 24 hour  Intake 3860.03 ml  Output   3075 ml  Net 785.03 ml    Stable early postop

## 2014-11-01 NOTE — Progress Notes (Signed)
Held dose of metoprolol due to HR 56.

## 2014-11-01 NOTE — OR Nursing (Signed)
11:50 - 2nd call to SICU 

## 2014-11-02 ENCOUNTER — Inpatient Hospital Stay (HOSPITAL_COMMUNITY): Payer: 59

## 2014-11-02 LAB — GLUCOSE, CAPILLARY
GLUCOSE-CAPILLARY: 94 mg/dL (ref 70–99)
GLUCOSE-CAPILLARY: 121 mg/dL — AB (ref 70–99)
GLUCOSE-CAPILLARY: 108 mg/dL — AB (ref 70–99)
Glucose-Capillary: 110 mg/dL — ABNORMAL HIGH (ref 70–99)
Glucose-Capillary: 115 mg/dL — ABNORMAL HIGH (ref 70–99)
Glucose-Capillary: 119 mg/dL — ABNORMAL HIGH (ref 70–99)
Glucose-Capillary: 121 mg/dL — ABNORMAL HIGH (ref 70–99)
Glucose-Capillary: 123 mg/dL — ABNORMAL HIGH (ref 70–99)
Glucose-Capillary: 124 mg/dL — ABNORMAL HIGH (ref 70–99)
Glucose-Capillary: 124 mg/dL — ABNORMAL HIGH (ref 70–99)
Glucose-Capillary: 126 mg/dL — ABNORMAL HIGH (ref 70–99)
Glucose-Capillary: 131 mg/dL — ABNORMAL HIGH (ref 70–99)
Glucose-Capillary: 132 mg/dL — ABNORMAL HIGH (ref 70–99)
Glucose-Capillary: 132 mg/dL — ABNORMAL HIGH (ref 70–99)
Glucose-Capillary: 148 mg/dL — ABNORMAL HIGH (ref 70–99)
Glucose-Capillary: 152 mg/dL — ABNORMAL HIGH (ref 70–99)
Glucose-Capillary: 164 mg/dL — ABNORMAL HIGH (ref 70–99)

## 2014-11-02 LAB — BASIC METABOLIC PANEL
ANION GAP: 5 (ref 5–15)
BUN: 8 mg/dL (ref 6–23)
CO2: 25 mmol/L (ref 19–32)
CREATININE: 0.69 mg/dL (ref 0.50–1.35)
Calcium: 7.9 mg/dL — ABNORMAL LOW (ref 8.4–10.5)
Chloride: 106 mEq/L (ref 96–112)
GLUCOSE: 134 mg/dL — AB (ref 70–99)
POTASSIUM: 3.8 mmol/L (ref 3.5–5.1)
Sodium: 136 mmol/L (ref 135–145)

## 2014-11-02 LAB — CBC
HCT: 36.7 % — ABNORMAL LOW (ref 39.0–52.0)
HEMATOCRIT: 38.4 % — AB (ref 39.0–52.0)
HEMOGLOBIN: 12.5 g/dL — AB (ref 13.0–17.0)
Hemoglobin: 13 g/dL (ref 13.0–17.0)
MCH: 30 pg (ref 26.0–34.0)
MCH: 30.7 pg (ref 26.0–34.0)
MCHC: 34.1 g/dL (ref 30.0–36.0)
MCHC: 33.9 g/dL (ref 30.0–36.0)
MCV: 88.2 fL (ref 78.0–100.0)
MCV: 90.8 fL (ref 78.0–100.0)
PLATELETS: 142 10*3/uL — AB (ref 150–400)
Platelets: 151 10*3/uL (ref 150–400)
RBC: 4.16 MIL/uL — AB (ref 4.22–5.81)
RBC: 4.23 MIL/uL (ref 4.22–5.81)
RDW: 13.6 % (ref 11.5–15.5)
RDW: 14.1 % (ref 11.5–15.5)
WBC: 9.7 10*3/uL (ref 4.0–10.5)
WBC: 10.6 10*3/uL — ABNORMAL HIGH (ref 4.0–10.5)

## 2014-11-02 LAB — POCT I-STAT 4, (NA,K, GLUC, HGB,HCT)
GLUCOSE: 84 mg/dL (ref 70–99)
HEMATOCRIT: 42 % (ref 39.0–52.0)
Hemoglobin: 14.3 g/dL (ref 13.0–17.0)
POTASSIUM: 3.3 mmol/L — AB (ref 3.5–5.1)
Sodium: 141 mmol/L (ref 135–145)

## 2014-11-02 LAB — CREATININE, SERUM
Creatinine, Ser: 1.19 mg/dL (ref 0.50–1.35)
GFR calc non Af Amer: 62 mL/min — ABNORMAL LOW (ref >90)
GFR, EST AFRICAN AMERICAN: 72 mL/min — AB (ref >90)

## 2014-11-02 LAB — MAGNESIUM
MAGNESIUM: 2.5 mg/dL (ref 1.5–2.5)
Magnesium: 2.5 mg/dL (ref 1.5–2.5)

## 2014-11-02 MED ORDER — MOVING RIGHT ALONG BOOK
Freq: Once | Status: AC
  Administered 2014-11-02: 08:00:00
  Filled 2014-11-02: qty 1

## 2014-11-02 MED ORDER — MORPHINE SULFATE 2 MG/ML IJ SOLN
2.0000 mg | INTRAMUSCULAR | Status: DC | PRN
  Administered 2014-11-02 (×4): 2 mg via INTRAVENOUS
  Filled 2014-11-02 (×4): qty 1

## 2014-11-02 MED ORDER — FUROSEMIDE 40 MG PO TABS
40.0000 mg | ORAL_TABLET | Freq: Every day | ORAL | Status: DC
  Administered 2014-11-03 – 2014-11-04 (×2): 40 mg via ORAL
  Filled 2014-11-02 (×2): qty 1

## 2014-11-02 MED ORDER — ASPIRIN EC 325 MG PO TBEC
325.0000 mg | DELAYED_RELEASE_TABLET | Freq: Every day | ORAL | Status: DC
  Administered 2014-11-02 – 2014-11-04 (×3): 325 mg via ORAL
  Filled 2014-11-02 (×3): qty 1

## 2014-11-02 MED ORDER — SODIUM CHLORIDE 0.9 % IV SOLN: 250.0000 mL | INTRAVENOUS | Status: DC | PRN

## 2014-11-02 MED ORDER — INSULIN DETEMIR 100 UNIT/ML ~~LOC~~ SOLN
20.0000 [IU] | Freq: Once | SUBCUTANEOUS | Status: AC
  Administered 2014-11-02: 20 [IU] via SUBCUTANEOUS
  Filled 2014-11-02: qty 0.2

## 2014-11-02 MED ORDER — POTASSIUM CHLORIDE CRYS ER 20 MEQ PO TBCR
20.0000 meq | EXTENDED_RELEASE_TABLET | Freq: Every day | ORAL | Status: DC
  Administered 2014-11-03 – 2014-11-04 (×2): 20 meq via ORAL
  Filled 2014-11-02 (×2): qty 1

## 2014-11-02 MED ORDER — POTASSIUM CHLORIDE 10 MEQ/50ML IV SOLN
10.0000 meq | INTRAVENOUS | Status: AC
  Administered 2014-11-02 (×3): 10 meq via INTRAVENOUS
  Filled 2014-11-02 (×3): qty 50

## 2014-11-02 MED ORDER — SODIUM CHLORIDE 0.9 % IJ SOLN
3.0000 mL | Freq: Two times a day (BID) | INTRAMUSCULAR | Status: DC
  Administered 2014-11-02 – 2014-11-03 (×3): 3 mL via INTRAVENOUS

## 2014-11-02 MED ORDER — INSULIN ASPART 100 UNIT/ML ~~LOC~~ SOLN
0.0000 [IU] | SUBCUTANEOUS | Status: DC
  Administered 2014-11-02: 4 [IU] via SUBCUTANEOUS
  Administered 2014-11-03: 2 [IU] via SUBCUTANEOUS

## 2014-11-02 MED ORDER — SODIUM CHLORIDE 0.9 % IJ SOLN: 3.0000 mL | INTRAMUSCULAR | Status: DC | PRN

## 2014-11-02 NOTE — Discharge Summary (Signed)
Physician Discharge Summary       [address]       [place] [address]             [phone]    Patient ID: Jeffrey Guerrero MRN: [id number] DOB/AGE: 1951/02/10 64 y.o.  Admit date: 11/01/2014 Discharge date: 11/04/2014  Admission Diagnoses: 1. Severe pulmonic insufficiency 2. History HTN (hypertension) 3. History of hypothyroidism 4. History of bacterial endocarditis - pulmonic valve vegetation with Streptococcus pneumoniae sepsis 5. History of DM  Discharge Diagnoses:  1. Severe pulmonic insufficiency 2. History HTN (hypertension) 3. History of hypothyroidism 4. History of bacterial endocarditis - pulmonic valve vegetation with Streptococcus pneumoniae sepsis 5. History of DM  Procedure (s):   Homograft Pulmonic Valve Replacement  LifeNet Human Allograft Pulmonary Valve (size 26 mm, ID# [id number]) by Dr. Roxy Manns on 11/01/2014.  History of Presenting Illness: This is a 64 year old previously healthy African American male who presented acutely this past spring on 02/12/2014 in septic shock after he suffered a syncopal episode of home with fevers greater than 103F. Blood cultures obtained at the time of admission grew Streptococcus pneumoniae. Transesophageal echocardiogram demonstrated the presence of vegetations on the pulmonic valve with at least moderate pulmonic insufficiency and CT angiogram of the chest revealed multiple pulmonary emboli consistent with septic emboli in both lungs. The patient was treated with a six-week course of intravenous Rocephin and anticoagulated using Xarelto. Followup blood cultures obtained after stopping antibiotics were negative. The patient has been followed carefully since that time by Dr. [name] who performed repeat transesophageal echocardiogram in June. At that time there was no sign of any residual vegetation on the pulmonic valve, but there was severe pulmonic insufficiency and mild to moderate right  ventricular dysfunction. No other significant abnormalities were noted. The patient was referred to discuss treatment options for management of severe pulmonic valve insufficiency. He was originally seen in consultation on 06/18/2014 and he was seen more recently on 09/24/2014 at which time we made plans to proceed with human allograft pulmonic valve replacement. Since then the patient underwent diagnostic cardiac catheterization on 10/09/2014. This was notable for the presence of minimal nonobstructive coronary artery disease and normal left ventricular systolic function. Right heart catheterization was not performed. The patient returns to the office today with plans to proceed with surgery on 11/01/2014. He reports no new problems or complaints over the last few weeks. He specifically denies any problems with shortness of breath, cough, fevers, chills, or chest pain.  The patient is married and lives with his wife in [place]. He works full-time as a Building control surveyor. This requires fairly strenuous physical activity. He states that it took approximately 2-3 months for him to fully recover from his illness. He states that he is now back to baseline and free of any symptoms. He specifically denies any symptoms of exertional shortness of breath, fevers, chills, or chest discomfort. His review of systems is otherwise unremarkable.  Dr. Roxy Manns had a long discussion with the patient and discussed the options of close observation vs elective surgical repair. Surgery would include a human allograft pulmonic valve replacement. Potential risks, benefits, and complications were discussed and the patient. He decided to proceed with surgery.  Brief Hospital Course:  The patient was extubated the evening of surgery without difficulty. He remained afebrile and hemodynamically stable. Gordy Councilman, a line, chest tubes, and foley were removed early in the post operative course. Lopressor was started and titrated accordingly. He  was volume over loaded and diuresed.  He was weaned off the insulin drip. Once he was tolerating a diet, home diabetic medicines were restarted. The patient's HGA1C pre op was 6.2. The patient was felt surgically stable for transfer from the ICU to [facility] for further convalescence on 11/02/2014. He/she continue to progress with cardiac rehab. He was ambulating on room air. He has been tolerating a diet and has had a bowel movement. Epicardial pacing wires and chest tube sutures will be removed prior to discharge. He is felt surgically stable for discharge today.  Latest Vital Signs: Blood pressure 122/83, pulse 78, temperature 99.1 F (37.3 C), temperature source Oral, resp. rate 18, height 6\' 1"  (1.854 m), weight 196 lb 3.4 oz (89 kg), SpO2 98 %.  Physical Exam: Rhythm: sinus Breath sounds:clear Heart sounds:RRR w/out murmur Incisions:Clean and dry Abdomen:Soft, non-distended, non-tender Extremities:Warm, well-perfused    Discharge Condition:Stable   Recent laboratory studies:  Lab Results  Component Value Date   WBC 11.2* 11/03/2014   HGB 12.4* 11/03/2014   HCT 37.1* 11/03/2014   MCV 90.7 11/03/2014   PLT 147* 11/03/2014   Lab Results  Component Value Date   NA 137 11/03/2014   K 4.2 11/03/2014   CL 102 11/03/2014   CO2 27 11/03/2014   CREATININE 1.05 11/03/2014   GLUCOSE 118* 11/03/2014      Diagnostic Studies:Dg Chest Port 1 View  11/02/2014   CLINICAL DATA:  Status post pulmonic valve replacement  EXAM: PORTABLE CHEST - 1 VIEW  COMPARISON:  Portable chest x-ray of November 01, 2014 and preoperative study of October 29, 2014.  FINDINGS: The lungs are reasonably well inflated. The interstitial markings remain increased. The cardiac silhouette is mildly enlarged. The central pulmonary  vascularity prominent though stable. The trachea and esophagus have been extubated. 2 left-sided chest tubes-mediastinal drains are unchanged in position. There are 8 intact sternal wires present.  IMPRESSION: There is mild pulmonary interstitial edema not significantly changed from yesterday's study. There is no pneumothorax or significant pleural effusion.   Electronically Signed   By: David  Martinique   On: 11/02/2014 07:54  Discharge Instructions    Amb Referral to Cardiac Rehabilitation    Complete by:  As directed           Discharge Medications:   Medication List    TAKE these medications        amLODipine 10 MG tablet  Commonly known as:  NORVASC  Take 1 tablet (10 mg total) by mouth daily.  Start taking on:  11/08/2014     aspirin 325 MG EC tablet  Take 1 tablet (325 mg total) by mouth daily.     furosemide 40 MG tablet  Commonly known as:  LASIX  Take 1 tablet (40 mg total) by mouth daily. For 4 days then stop.     levothyroxine 100 MCG tablet  Commonly known as:  SYNTHROID, LEVOTHROID  Take 100 mcg by mouth daily before breakfast.     metFORMIN 500 MG tablet  Commonly known as:  GLUCOPHAGE  Take 1 tablet (500 mg total) by mouth 2 (two) times daily.     metoprolol tartrate 25 MG tablet  Commonly known as:  LOPRESSOR  Take 0.5 tablets (12.5 mg total) by mouth 2 (two) times daily.     oxyCODONE 5 MG immediate release tablet  Commonly known as:  Oxy IR/ROXICODONE  Take 1-2 tablets (5-10 mg total) by mouth every 4 (four) hours as needed for severe pain.     potassium chloride SA  20 MEQ tablet  Commonly known as:  K-DUR,KLOR-CON  Take 1 tablet (20 mEq total) by mouth daily. For 4 days then stop.       The patient has been discharged on:   1.Beta Blocker:  Yes [  x ]                              No   [   ]                              If No, reason:  2.Ace Inhibitor/ARB: Yes [   ]                                     No  [  x  ]                                      If No, reason:Preserved LVEF, no CAD  3.Statin:   Yes [   ]                  No  [ x  ]                  If No, reason:No CAD, no hyperlipidemia  4.Shela Commons:  Yes  [  x ]                  No   [   ]                  If No, reason:  Follow Up Appointments: Follow-up Information    Follow up with Truitt Merle, NP On 11/21/2014.   Specialty:  Nurse Practitioner   Why:  Appointment time is at 1:30 pm   Contact information:   [address] [place] [address] [phone]       Follow up with Rexene Alberts, MD On 11/26/2014.   Specialty:  Cardiothoracic Surgery   Why:  PA/LAT CXR to be at [facility] (which is in the same building as Dr. Guy Sandifer office) on 11/26/2014 at 1:00 pm;Appointment time is at 2:00 pm   Contact information:   [address] [place] [address] [phone]       Signed: Lars Pinks MPA-C 11/04/2014, 11:16 AM

## 2014-11-02 NOTE — Progress Notes (Signed)
      [address]       [place] [address]             [phone]        CARDIOTHORACIC SURGERY PROGRESS NOTE   R1 Day Post-Op Procedure(s) (LRB): PULMONIC HOMOGRAFT VALVE REPLACEMENT  ([name]) TRANSESOPHAGEAL ECHOCARDIOGRAM (TEE) ([name])  Subjective: Looks good and feels well.  Mild soreness associated primarily w/ chest tubes  Objective: Vital signs: BP Readings from Last 1 Encounters:  11/02/14 109/72   Pulse Readings from Last 1 Encounters:  11/02/14 80   Resp Readings from Last 1 Encounters:  11/02/14 17   Temp Readings from Last 1 Encounters:  11/02/14 99.3 F (37.4 C)     Hemodynamics:    Physical Exam:  Rhythm:   sinus  Breath sounds: clear  Heart sounds:  RRR w/out murmur  Incisions:  Clean and dry  Abdomen:  Soft, non-distended, non-tender  Extremities:  Warm, well-perfused  Chest tubes:  Low volume thin serosanguinous fluid but dumped 70 mL with standing this morning   Intake/Output from previous day: 01/14 0701 - 01/15 0700 In: 6154.4 [I.V.:4470.4; Blood:534; IV [id number]:0263] Out: 7858 [Urine:4220; Chest Tube:470] Intake/Output this shift: Total I/O In: -  Out: 65 [Urine:65]  Lab Results:  CBC: Recent Labs  11/01/14 1845 11/01/14 1852 11/02/14 0400  WBC 8.0  --  9.7  HGB 13.0 13.6 12.5*  HCT 37.4* 40.0 36.7*  PLT 132*  --  142*    BMET:  Recent Labs  11/01/14 1852 11/02/14 0400  NA 141 136  K 3.9 3.8  CL 105 106  CO2  --  25  GLUCOSE 151* 134*  BUN 10 8  CREATININE 0.70 0.69  CALCIUM  --  7.9*     CBG (last 3)   Recent Labs  11/02/14 0219 11/02/14 0303 11/02/14 0401  GLUCAP 94 123* 115*    ABG    Component Value Date/Time   PHART 7.323* 11/01/2014 2108   PCO2ART 44.9 11/01/2014 2108   PO2ART 143.0* 11/01/2014 2108   HCO3 23.4 11/01/2014 2108   TCO2 25 11/01/2014 2108   ACIDBASEDEF 3.0* 11/01/2014 2108   O2SAT 99.0 11/01/2014 2108    CXR: PORTABLE CHEST - 1 VIEW  COMPARISON:  Portable chest x-ray of November 01, 2014 and preoperative study of October 29, 2014.  FINDINGS: The lungs are reasonably well inflated. The interstitial markings remain increased. The cardiac silhouette is mildly enlarged. The central pulmonary vascularity prominent though stable. The trachea and esophagus have been extubated. 2 left-sided chest tubes-mediastinal drains are unchanged in position. There are 8 intact sternal wires present.  IMPRESSION: There is mild pulmonary interstitial edema not significantly changed from yesterday's study. There is no pneumothorax or significant pleural effusion.   Electronically Signed  By: David Martinique  On: 11/02/2014 07:54  Assessment/Plan: S/P Procedure(s) (LRB): PULMONIC HOMOGRAFT VALVE REPLACEMENT  ([name]) TRANSESOPHAGEAL ECHOCARDIOGRAM (TEE) ([name])  Doing well POD1 Maintaining NSR w/ stable BP, no drips Expected post op acute blood loss anemia, very mild Expected post op atelectasis, mild Expected post op volume excess, mild Type II diabetes mellitus, excellent glycemic control on insulin drip   Mobilize  Leave tubes in this morning and d/c later today or am tomorrow depending on output  D/C lines  1 dose Levemir insulin and transition off insulin drip  Transfer step down   Kimmi Acocella H 11/02/2014 8:50 AM

## 2014-11-02 NOTE — Progress Notes (Signed)
Unable to get OOB to chair at time ordered this am. [place] lines at this time.

## 2014-11-02 NOTE — Progress Notes (Signed)
Utilization Review Completed.  

## 2014-11-02 NOTE — Discharge Instructions (Signed)
Activity: 1.May walk up steps °               2.No lifting more than ten pounds for four weeks.  °               3.No driving for four weeks. °               4.Stop any activity that causes chest pain, shortness of breath, dizziness, sweating or excessive weakness. °               5.Avoid straining. °               6.Continue with your breathing exercises daily. ° °Diet: Low fat, Low salt diet ° °Wound Care: May shower.  Clean wounds with mild soap and water daily. Contact the office at [phone] if any problems arise. ° ° °

## 2014-11-03 ENCOUNTER — Inpatient Hospital Stay (HOSPITAL_COMMUNITY): Payer: 59

## 2014-11-03 LAB — BASIC METABOLIC PANEL
ANION GAP: 8 (ref 5–15)
BUN: 17 mg/dL (ref 6–23)
CALCIUM: 8.5 mg/dL (ref 8.4–10.5)
CO2: 27 mmol/L (ref 19–32)
CREATININE: 1.05 mg/dL (ref 0.50–1.35)
Chloride: 102 mEq/L (ref 96–112)
GFR calc non Af Amer: 73 mL/min — ABNORMAL LOW (ref >90)
GFR, EST AFRICAN AMERICAN: 84 mL/min — AB (ref >90)
Glucose, Bld: 118 mg/dL — ABNORMAL HIGH (ref 70–99)
Potassium: 4.2 mmol/L (ref 3.5–5.1)
Sodium: 137 mmol/L (ref 135–145)

## 2014-11-03 LAB — GLUCOSE, CAPILLARY
GLUCOSE-CAPILLARY: 110 mg/dL — AB (ref 70–99)
Glucose-Capillary: 120 mg/dL — ABNORMAL HIGH (ref 70–99)
Glucose-Capillary: 88 mg/dL (ref 70–99)

## 2014-11-03 LAB — CBC
HEMATOCRIT: 37.1 % — AB (ref 39.0–52.0)
Hemoglobin: 12.4 g/dL — ABNORMAL LOW (ref 13.0–17.0)
MCH: 30.3 pg (ref 26.0–34.0)
MCHC: 33.4 g/dL (ref 30.0–36.0)
MCV: 90.7 fL (ref 78.0–100.0)
Platelets: 147 10*3/uL — ABNORMAL LOW (ref 150–400)
RBC: 4.09 MIL/uL — AB (ref 4.22–5.81)
RDW: 14.1 % (ref 11.5–15.5)
WBC: 11.2 10*3/uL — ABNORMAL HIGH (ref 4.0–10.5)

## 2014-11-03 MED ORDER — INSULIN ASPART 100 UNIT/ML ~~LOC~~ SOLN: 0.0000 [IU] | SUBCUTANEOUS | Status: DC

## 2014-11-03 MED ORDER — METFORMIN HCL 500 MG PO TABS
500.0000 mg | ORAL_TABLET | Freq: Two times a day (BID) | ORAL | Status: DC
  Administered 2014-11-03 – 2014-11-04 (×2): 500 mg via ORAL
  Filled 2014-11-03 (×4): qty 1

## 2014-11-03 NOTE — Progress Notes (Signed)
CARDIAC REHAB PHASE I   PRE:  Rate/Rhythm: 83 sinus rhythm  BP:  Supine:   Sitting: 108/63  Standing:    SaO2: 96% ra  MODE:  Ambulation: 350 ft   POST:  Rate/Rhythem: 83 sinus rhythm  BP:  Supine:   Sitting: 133/82  Standing:    SaO2: 97% ra  1128-1210 Pt ambulated in hallway x1 assist.  Steady gait, c/o incisional discomfort, otherwise asymptomatic.  Pt has productive cough, coughed up copious yellow phlegm.  IS use reinforced, pt educated in good pulmonary hygiene. Education completed including sternal precautions, restrictions, incision care, low sodium, diabetic diet,  exercise, and medication compliance.  Pt oriented to outpatient cardiac rehab.  At pt request, referral will be sent to [facility].  Understanding verbalized   Jeffrey Guerrero

## 2014-11-03 NOTE — Progress Notes (Addendum)
      [address]       [place] [address]             [phone]        2 Days Post-Op Procedure(s) (LRB): PULMONIC HOMOGRAFT VALVE REPLACEMENT  ([name]) TRANSESOPHAGEAL ECHOCARDIOGRAM (TEE) ([name])  Subjective: Patient with small bowel movement this am  Objective: Vital signs in last 24 hours: Temp:  [98.2 F (36.8 C)-99.2 F (37.3 C)] 98.2 F (36.8 C) (01/16 0300) Pulse Rate:  [61-76] 69 (01/16 0300) Cardiac Rhythm:  [-] Normal sinus rhythm (01/15 1945) Resp:  [11-25] 20 (01/16 0300) BP: (85-115)/(58-85) 101/63 mmHg (01/16 0300) SpO2:  [95 %-100 %] 98 % (01/16 0300) Arterial Line BP: (98-113)/(64-73) 98/64 mmHg (01/15 1200) Weight:  [193 lb 11.2 oz (87.862 kg)] 193 lb 11.2 oz (87.862 kg) (01/16 0500)  Pre op weight 87.5 kg Current Weight  11/03/14 193 lb 11.2 oz (87.862 kg)      Intake/Output from previous day: 01/15 0701 - 01/16 0700 In: 202.3 [I.V.:2.3; IV Piggyback:200] Out: 365 [Urine:365]   Physical Exam:  Cardiovascular: RRR, no murmurs Pulmonary: Clear to auscultation on right and slightly diminished at left; no rales, wheezes, or rhonchi. Abdomen: Soft, non tender, bowel sounds present. Extremities: Trace  lower extremity edema. Wound: Clean and dry.  No erythema or signs of infection.  Lab Results: CBC: Recent Labs  11/02/14 2104 11/03/14 0505  WBC 10.6* 11.2*  HGB 13.0 12.4*  HCT 38.4* 37.1*  PLT 151 147*   BMET:  Recent Labs  11/02/14 0400 11/02/14 2104 11/03/14 0505  NA 136  --  137  K 3.8  --  4.2  CL 106  --  102  CO2 25  --  27  GLUCOSE 134*  --  118*  BUN 8  --  17  CREATININE 0.69 1.19 1.05  CALCIUM 7.9*  --  8.5    PT/INR:  Lab Results  Component Value Date   INR 1.19 11/01/2014   INR 1.04 10/29/2014   INR 0.94 10/05/2014   ABG:  INR: Will add last result for INR, ABG once components are confirmed Will add last 4 CBG results once components are confirmed  Assessment/Plan:  1. CV - SR in the  80's. On Lopressor 12.5 bid and ecasa. 2.  Pulmonary - Chest tubes removed yesterday. On room air. CXR this am shows no pneumothorax, left base atelectasis, right lung clear. Encourage incentive spirometer 3. Volume Overload - On Lasix 40 daily 4.  Acute blood loss anemia - H and H stable at 12.4 and 37.1 5. DM-CBGs 124/110/120. On Insulin PRN. Will restart Metformin as taken pre op. HGA1C pre op 6.2 6. Remove EPW in am 7. Possibly home in am  ZIMMERMAN,DONIELLE MPA-C 11/03/2014,8:47 AM  Patient looks good- ambulating well patient examined and medical record reviewed,agree with above note. VAN TRIGT III,PETER 11/03/2014

## 2014-11-04 LAB — GLUCOSE, CAPILLARY
Glucose-Capillary: 106 mg/dL — ABNORMAL HIGH (ref 70–99)
Glucose-Capillary: 103 mg/dL — ABNORMAL HIGH (ref 70–99)
Glucose-Capillary: 114 mg/dL — ABNORMAL HIGH (ref 70–99)

## 2014-11-04 MED ORDER — AMLODIPINE BESYLATE 10 MG PO TABS: 10.0000 mg | ORAL_TABLET | Freq: Every day | ORAL | Status: DC

## 2014-11-04 MED ORDER — POTASSIUM CHLORIDE CRYS ER 20 MEQ PO TBCR: 20.0000 meq | EXTENDED_RELEASE_TABLET | Freq: Every day | ORAL | Status: DC

## 2014-11-04 MED ORDER — FUROSEMIDE 40 MG PO TABS: 40.0000 mg | ORAL_TABLET | Freq: Every day | ORAL | Status: DC

## 2014-11-04 MED ORDER — OXYCODONE HCL 5 MG PO TABS: 5.0000 mg | ORAL_TABLET | ORAL | Status: DC | PRN

## 2014-11-04 MED ORDER — ASPIRIN 325 MG PO TBEC: 325.0000 mg | DELAYED_RELEASE_TABLET | Freq: Every day | ORAL | Status: DC

## 2014-11-04 MED ORDER — METOPROLOL TARTRATE 25 MG PO TABS: 12.5000 mg | ORAL_TABLET | Freq: Two times a day (BID) | ORAL | Status: DC

## 2014-11-04 NOTE — Progress Notes (Signed)
dc'ed pacing wires pt. tolerated well 

## 2014-11-04 NOTE — Progress Notes (Addendum)
      [address]       [place] [address]             [phone]        3 Days Post-Op Procedure(s) (LRB): PULMONIC HOMOGRAFT VALVE REPLACEMENT  ([name]) TRANSESOPHAGEAL ECHOCARDIOGRAM (TEE) ([name])  Subjective: Patient had bowel movement. He wants to go home.  Objective: Vital signs in last 24 hours: Temp:  [97.8 F (36.6 C)-99.1 F (37.3 C)] 99.1 F (37.3 C) (01/17 0548) Pulse Rate:  [63-78] 78 (01/17 0548) Cardiac Rhythm:  [-] Atrial paced (01/17 0815) Resp:  [18-20] 18 (01/17 0548) BP: (93-122)/(69-83) 122/83 mmHg (01/17 0548) SpO2:  [97 %-98 %] 98 % (01/17 0548) Weight:  [196 lb 3.4 oz (89 kg)] 196 lb 3.4 oz (89 kg) (01/17 0548)  Pre op weight 87.5 kg Current Weight  11/04/14 196 lb 3.4 oz (89 kg)      Intake/Output from previous day: 01/16 0701 - 01/17 0700 In: 240 [P.O.:240] Out: -    Physical Exam:  Cardiovascular: RRR, no murmurs Pulmonary: Clear to auscultation on right and slightly diminished at left; no rales, wheezes, or rhonchi. Abdomen: Soft, non tender, bowel sounds present. Extremities: Trace  lower extremity edema. Wound: Clean and dry.  No erythema or signs of infection.  Lab Results: CBC:  Recent Labs  11/02/14 2104 11/03/14 0505  WBC 10.6* 11.2*  HGB 13.0 12.4*  HCT 38.4* 37.1*  PLT 151 147*   BMET:   Recent Labs  11/02/14 0400 11/02/14 2104 11/03/14 0505  NA 136  --  137  K 3.8  --  4.2  CL 106  --  102  CO2 25  --  27  GLUCOSE 134*  --  118*  BUN 8  --  17  CREATININE 0.69 1.19 1.05  CALCIUM 7.9*  --  8.5    PT/INR:  Lab Results  Component Value Date   INR 1.19 11/01/2014   INR 1.04 10/29/2014   INR 0.94 10/05/2014   ABG:  INR: Will add last result for INR, ABG once components are confirmed Will add last 4 CBG results once components are confirmed  Assessment/Plan:  1. CV - SR in the 80's. On Lopressor 12.5 bid and ecasa. 2.  Pulmonary -  Encourage incentive spirometer 3. Volume  Overload - On Lasix 40 daily 4.  Acute blood loss anemia - H and H stable at 12.4 and 37.1 5. DM-CBGs 114/106/103. On Insulin PRN. Continue Metformin as taken pre op. HGA1C pre op 6.2 6. Remove EPW  7. Remove sutures 7. Discharge  ZIMMERMAN,DONIELLE MPA-C 11/04/2014,9:00 AM  patient examined and medical record reviewed,agree with above note. Patient appears to be ready for discharge home and discharge instructions were provided to patient VAN TRIGT III,PETER 11/04/2014

## 2014-11-04 NOTE — Progress Notes (Signed)
Discharged to home with family office visits in place teaching done  

## 2014-11-05 MED FILL — Magnesium Sulfate Inj 50%: INTRAMUSCULAR | Qty: 10 | Status: AC

## 2014-11-05 MED FILL — Potassium Chloride Inj 2 mEq/ML: INTRAVENOUS | Qty: 40 | Status: AC

## 2014-11-05 MED FILL — Heparin Sodium (Porcine) Inj 1000 Unit/ML: INTRAMUSCULAR | Qty: 30 | Status: AC

## 2014-11-06 ENCOUNTER — Encounter (HOSPITAL_COMMUNITY): Payer: Self-pay | Admitting: Thoracic Surgery (Cardiothoracic Vascular Surgery)

## 2014-11-21 ENCOUNTER — Ambulatory Visit (INDEPENDENT_AMBULATORY_CARE_PROVIDER_SITE_OTHER): Payer: 59 | Admitting: Nurse Practitioner

## 2014-11-21 ENCOUNTER — Encounter: Payer: Self-pay | Admitting: Nurse Practitioner

## 2014-11-21 VITALS — BP 118/68 | HR 64 | Ht 73.0 in | Wt 191.8 lb

## 2014-11-21 DIAGNOSIS — I10 Essential (primary) hypertension: Secondary | ICD-10-CM

## 2014-11-21 DIAGNOSIS — Z954 Presence of other heart-valve replacement: Secondary | ICD-10-CM

## 2014-11-21 LAB — CBC
HCT: 34.7 % — ABNORMAL LOW (ref 39.0–52.0)
Hemoglobin: 11.5 g/dL — ABNORMAL LOW (ref 13.0–17.0)
MCHC: 33.3 g/dL (ref 30.0–36.0)
MCV: 88.6 fl (ref 78.0–100.0)
Platelets: 406 10*3/uL — ABNORMAL HIGH (ref 150.0–400.0)
RBC: 3.91 Mil/uL — ABNORMAL LOW (ref 4.22–5.81)
RDW: 13.9 % (ref 11.5–15.5)
WBC: 6.2 10*3/uL (ref 4.0–10.5)

## 2014-11-21 LAB — BASIC METABOLIC PANEL
BUN: 17 mg/dL (ref 6–23)
CO2: 30 mEq/L (ref 19–32)
Calcium: 9.5 mg/dL (ref 8.4–10.5)
Chloride: 101 mEq/L (ref 96–112)
Creatinine, Ser: 0.93 mg/dL (ref 0.40–1.50)
GFR: 104.74 mL/min (ref >60.00)
Glucose, Bld: 89 mg/dL (ref 70–99)
Potassium: 3.6 mEq/L (ref 3.5–5.1)
Sodium: 137 mEq/L (ref 135–145)

## 2014-11-21 NOTE — Patient Instructions (Addendum)
We will be checking the following labs today BMET and CBC  Stay on your current medicines  See Dr. [name] (or me) in 4 to 6 weeks  Follow up with Dr. Thea Silversmith about your diabetes and your Metformin  Ok to start back walking - about 10 minutes and work towards 45 to 60 minutes per day  We will get an ultrasound of your heart  Call the [place] office at [phone] if you have any questions, problems or concerns.

## 2014-11-21 NOTE — Addendum Note (Signed)
Addended by: Newt Minion on: 11/21/2014 04:52 PM   Modules accepted: Orders

## 2014-11-21 NOTE — Progress Notes (Signed)
CARDIOLOGY OFFICE NOTE  Date:  11/21/2014    Jeffrey Guerrero Date of Birth: 06/07/1951 Medical Record #[id number]  PCP:  [name], MD  Cardiologist:  [name]    Chief Complaint  Patient presents with  . S/P pulmonic valve replacement    [facility] hospital visit - seen for Dr. [name].      History of Present Illness: Jeffrey Guerrero is a 64 y.o. male who presents today for a post hospital visit. Seen for Dr. [name]. He has a history of HTN and hypothyroidism. He has had septic shock after he suffered a syncopal episode of home with fevers greater than 103F back in April of 2015. Blood cultures obtained at the time of admission grew Streptococcus pneumoniae. Transesophageal echocardiogram demonstrated the presence of vegetations on the pulmonic valve with at least moderate pulmonic insufficiency and CT angiogram of the chest revealed multiple pulmonary emboli consistent with septic emboli in both lungs. The patient was treated with a six-week course of intravenous Rocephin and anticoagulated using Xarelto. Followup blood cultures obtained after stopping antibiotics were negative. The patient was followed carefully since that time by Dr. [name] who performed repeat transesophageal echocardiogram in June. At that time there were no signs of any residual vegetation on the pulmonic valve, but there was severe pulmonic insufficiency and mild to moderate right ventricular dysfunction. No other significant abnormalities were noted. The patient was referred to discuss treatment options for management of severe pulmonic valve insufficiency. He was originally seen in consultation on 06/18/2014 and he was seen more recently on 09/24/2014 at which time we made plans to proceed with human allograft pulmonic valve replacement. Since then the patient underwent diagnostic cardiac catheterization on 10/09/2014. This was notable for the presence of minimal nonobstructive coronary artery disease and normal left  ventricular systolic function. Right heart catheterization was not performed.   He underwent his surgery on 11/01/2014 with homograft pulmonic valve replacement with a [employer] Human Allograft pulmonary valve #26 mm per Dr. Roxy Manns. Post op course noted to be unremarkable.   Comes back today. Here. With his wife. He has been home for a couple of weeks. No problems or concerns that he notes. No chest pain. Not short of breath. No fever or chills. Weight stable. No swelling. Does not wish to go to cardiac rehab. Previously walked at the mall on a regular basis. Has finished his lasix and potassium. Has not seen PCP yet. Sees Dr. Roxy Manns next week.    Past Medical History  Diagnosis Date  . Hypertension   . Thyroid disease   . Hypothyroidism   . Arthritis     "knees" (02/13/2014)  . 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 Pulmonic valve insufficiency 02/16/2014    severe  . Diabetes mellitus without complication     takes metformin  . S/P pulmonic valve replacement with homograft 11/01/2014    Past Surgical History  Procedure Laterality Date  . Tonsillectomy    . Appendectomy    . Tee without cardioversion [name] 02/16/2014    Procedure: TRANSESOPHAGEAL ECHOCARDIOGRAM (TEE);  Surgeon: [name], MD;  Location[facility] ENDOSCOPY;  Service: Cardiovascular;  Laterality: [name];  . Tee without cardioversion [name] 04/12/2014    Procedure: TRANSESOPHAGEAL ECHOCARDIOGRAM (TEE);  Surgeon: [name], MD;  Location: [facility];  Service: Cardiovascular;  Laterality: [name];  .  Left heart catheterization with coronary angiogram [name] 10/09/2014    Procedure: LEFT HEART CATHETERIZATION WITH CORONARY ANGIOGRAM;  Surgeon: Jettie Booze, MD;  Location: [facility] CATH LAB;  Service: Cardiovascular;  Laterality: [name];  . Colonoscopy    .  Aortic valve replacement [name] 11/01/2014    Procedure: PULMONIC HOMOGRAFT VALVE REPLACEMENT ;  Surgeon: Rexene Alberts, MD;  Location: [facility];  Service: Open Heart Surgery;  Laterality: [name];  . Tee without cardioversion [name] 11/01/2014    Procedure: TRANSESOPHAGEAL ECHOCARDIOGRAM (TEE);  Surgeon: Rexene Alberts, MD;  Location: [facility];  Service: Open Heart Surgery;  Laterality: [name];     Medications: Current Outpatient Prescriptions  Medication Sig Dispense Refill  . amLODipine (NORVASC) 10 MG tablet Take 1 tablet (10 mg total) by mouth daily.    Marland Kitchen aspirin EC 325 MG EC tablet Take 1 tablet (325 mg total) by mouth daily. 30 tablet 0  . levothyroxine (SYNTHROID, LEVOTHROID) 100 MCG tablet Take 100 mcg by mouth daily before breakfast.    . metFORMIN (GLUCOPHAGE) 500 MG tablet Take 1 tablet (500 mg total) by mouth 2 (two) times daily.    . metoprolol tartrate (LOPRESSOR) 25 MG tablet Take 0.5 tablets (12.5 mg total) by mouth 2 (two) times daily. 30 tablet 1  . oxyCODONE (OXY IR/ROXICODONE) 5 MG immediate release tablet Take 1-2 tablets (5-10 mg total) by mouth every 4 (four) hours as needed for severe pain. 30 tablet 0   No current facility-administered medications for this visit.    Allergies: No Known Allergies  Social History: The patient  reports that he has never smoked. He has never used smokeless tobacco. He reports that he drinks about 1.2 oz of alcohol per week. He reports that he does not use illicit drugs.   Family History: The patient's family history is negative for Heart disease. His father is deceased - he does not know why. Mother is in her 33's - he does not believe she has any heart issues.   Review of Systems: Please see the history of present illness.    All other systems are reviewed and negative.   Physical Exam: VS:  BP 118/68 mmHg  Pulse 64  Ht 6\' 1"  (1.854 m)  Wt 191 lb 12.8 oz (87 kg)  BMI 25.31 kg/m2 .  BMI Body mass index is 25.31 kg/(m^2).  Wt Readings from  Last 3 Encounters:  11/21/14 191 lb 12.8 oz (87 kg)  11/04/14 196 lb 3.4 oz (89 kg)  10/29/14 193 lb (87.544 kg)    General: Pleasant. Well developed, well nourished and in no acute distress. His affect is a little flat.  HEENT: Normal. Neck: Supple.  Cardiac: Regular rate and rhythm. He has an outflow murmur noted. No edema. Sternum looks ok - no signs of infection.  Respiratory:  Lungs are clear to auscultation bilaterally with normal work of breathing.  GI: Soft and nontender.  MS: No deformity or atrophy. Gait and ROM intact. Skin: Warm and dry. Color is normal.  Neuro:  Strength and sensation are intact and no gross focal deficits noted.  Psych: Alert, appropriate and with normal affect but a little flat.    LABORATORY DATA:  EKG:  EKG is ordered today. This demonstrates NSR with diffuse ST and T wave changes inferiorly and anteriorly. This is unchanged.  .  Lab Results  Component Value Date   WBC 11.2* 11/03/2014   HGB 12.4* 11/03/2014   HCT 37.1* 11/03/2014   PLT 147* 11/03/2014  GLUCOSE 118* 11/03/2014   ALT 16 10/29/2014   AST 20 10/29/2014   NA 137 11/03/2014   K 4.2 11/03/2014   CL 102 11/03/2014   CREATININE 1.05 11/03/2014   BUN 17 11/03/2014   CO2 27 11/03/2014   TSH 3.971 10/05/2014   INR 1.19 11/01/2014   HGBA1C 6.2* 10/29/2014    BNP (last 3 results) No results for input(s): BNP in the last 8760 hours.  ProBNP (last 3 results)  Recent Labs  02/12/14 2212  PROBNP 1062.0*     Other Studies Reviewed Today:   Assessment/Plan: 1. S/P pulmonic valve replacement - doing well clinically. Does not prefer to go to cardiac rehab. Reviewed lifting restrictions. Ok to start walking. Will get echocardiogram. Check baseline labs today. See Dr. [name] (or me) in 4 to 6 weeks. Will need lfelong SBE.   2. HTN - BP looks good on his current regimen  3. Hypothyroidism - on replacement  4. NIDDM  Current medicines are reviewed with the patient today.  The  patient does not have concerns regarding medicines.  The following changes have been made:  See above.  Labs/ tests ordered today include:    Orders Placed This Encounter  Procedures  . Basic metabolic panel  . CBC  . EKG 12-Lead  . 2D Echocardiogram without contrast     Disposition:   FU with Dr. [name] or me in 4 weeks  Patient is agreeable to this plan and will call if any problems develop in the interim.   Signed: Burtis Junes, RN, ANP-C 11/21/2014 2:05 PM  [facility] [address] [place], [place]  [address] Phone: [phone] Fax: [phone]

## 2014-11-22 ENCOUNTER — Ambulatory Visit (HOSPITAL_COMMUNITY): Payer: 59 | Attending: Nurse Practitioner

## 2014-11-22 ENCOUNTER — Other Ambulatory Visit: Payer: Self-pay | Admitting: Thoracic Surgery (Cardiothoracic Vascular Surgery)

## 2014-11-22 DIAGNOSIS — I379 Nonrheumatic pulmonary valve disorder, unspecified: Secondary | ICD-10-CM

## 2014-11-22 DIAGNOSIS — Z954 Presence of other heart-valve replacement: Secondary | ICD-10-CM | POA: Diagnosis present

## 2014-11-22 NOTE — Progress Notes (Signed)
2D Echo completed. 11/22/2014

## 2014-11-26 ENCOUNTER — Ambulatory Visit (INDEPENDENT_AMBULATORY_CARE_PROVIDER_SITE_OTHER): Payer: Self-pay | Admitting: Thoracic Surgery (Cardiothoracic Vascular Surgery)

## 2014-11-26 ENCOUNTER — Encounter: Payer: Self-pay | Admitting: Thoracic Surgery (Cardiothoracic Vascular Surgery)

## 2014-11-26 ENCOUNTER — Ambulatory Visit
Admission: RE | Admit: 2014-11-26 | Discharge: 2014-11-26 | Disposition: A | Discharge status: Home / Self Care | Payer: 59 | Source: Ambulatory Visit | Attending: Thoracic Surgery (Cardiothoracic Vascular Surgery) | Admitting: Thoracic Surgery (Cardiothoracic Vascular Surgery)

## 2014-11-26 VITALS — BP 117/70 | HR 63 | Resp 20 | Ht 73.0 in | Wt 185.0 lb

## 2014-11-26 DIAGNOSIS — I379 Nonrheumatic pulmonary valve disorder, unspecified: Secondary | ICD-10-CM

## 2014-11-26 DIAGNOSIS — I371 Nonrheumatic pulmonary valve insufficiency: Secondary | ICD-10-CM

## 2014-11-26 DIAGNOSIS — Z954 Presence of other heart-valve replacement: Secondary | ICD-10-CM

## 2014-11-26 NOTE — Patient Instructions (Addendum)
The patient should continue to avoid any heavy lifting or strenuous use of arms or shoulders for at least a total of three months from the time of surgery.  The patient may return to driving an automobile as long as they are no longer requiring oral narcotic pain relievers during the daytime.  It would be wise to start driving only short distances during the daylight and gradually increase from there as they feel comfortable.  The patient is encouraged to enroll and participate in the outpatient cardiac rehab program beginning as soon as practical.  

## 2014-11-26 NOTE — Progress Notes (Signed)
Jeffrey Guerrero       ,[place] [address]             [phone]     CARDIOTHORACIC SURGERY OFFICE NOTE  Referring Provider is Cloward, Dianna Rossetti, MD PCP is Jeffrey Coyer, MD   HPI:  Patient is a 64 year old male who reports today at approximately 3 weeks s/p Homograft Pulmonic Valve Replacement performed on 11/01/14.  He is a previously healthy [place] American male who presented acutely this past spring on 02/12/2014 in septic shock. Blood cultures obtained at the time of admission grew Streptococcus pneumoniae. Transesophageal echocardiogram demonstrated the presence of vegetations on the pulmonic valve with at least moderate pulmonic insufficiency and CT angiogram of the chest revealed multiple pulmonary emboli consistent with septic emboli in both lungs. The patient was treated with a six-week course of intravenous Rocephin and anticoagulated using Xarelto. Followup blood cultures obtained after stopping antibiotics were negative. The patient has been followed carefully since that time by Dr. Harrington Guerrero who performed repeat transesophageal echocardiogram in June. At that time there was no sign of any residual vegetation on the pulmonic valve, but there was severe pulmonic insufficiency and mild to moderate right ventricular dysfunction. Over time the patient developed progressive RV chamber enlargement although he has remained asymptomatic.  The patient underwent Homograft Pulmonic Valve Replacement on 11/01/14 and tolerated the procedure without complication.  His subsequent postoperative hospital stay was also uncomplicated.    Today the patient feels very well and has been steadily improving since discharge.  He has been mildly sore in the anterior chest, but not in pain, and has stopped taking pain medication.  He has intermittent and steadily improving non-productive cough.  He denies CP, palpitations, SOB, dizziness, lightheadedness, or swelling.  He has been increasing  his exercise tolerance steadily and is primarily walking.  His energy and appetite are good.  He has been taking his medications as directed.  Overall, the patient is recovering steadily.  He is happy with his postoperative course and does not have any concerns.   Current Outpatient Prescriptions  Medication Sig Dispense Refill  . amLODipine (NORVASC) 10 MG tablet Take 1 tablet (10 mg total) by mouth daily.    Marland Kitchen aspirin EC 325 MG EC tablet Take 1 tablet (325 mg total) by mouth daily. 30 tablet 0  . levothyroxine (SYNTHROID, LEVOTHROID) 100 MCG tablet Take 100 mcg by mouth daily before breakfast.    . metFORMIN (GLUCOPHAGE) 500 MG tablet Take 1 tablet (500 mg total) by mouth 2 (two) times daily.    . metoprolol tartrate (LOPRESSOR) 25 MG tablet Take 0.5 tablets (12.5 mg total) by mouth 2 (two) times daily. 30 tablet 1  . oxyCODONE (OXY IR/ROXICODONE) 5 MG immediate release tablet Take 1-2 tablets (5-10 mg total) by mouth every 4 (four) hours as needed for severe pain. 30 tablet 0   No current facility-administered medications for this visit.      Physical Exam:   BP 117/70 mmHg  Pulse 63  Resp 20  Ht 6\' 1"  (1.854 m)  Wt 185 lb (83.915 kg)  BMI 24.41 kg/m2  SpO2 95%  General:  Well appearing in NAD  Chest:   CTA b/l A/P.  No increased work of breathing  CV:   RRR, No murmur appreciated  Incisions:  Well healing  Abdomen:  Soft, nt, nd, +bs  Extremities:  Warm and well perfused w/o edema  Diagnostic Tests:  CHEST 2 VIEW  COMPARISON:  11/03/2014  FINDINGS: Cardiac shadow is at the upper limits of normal in size. Postsurgical changes are again noted. Lungs are well aerated bilaterally without focal infiltrate or sizable effusion. No acute bony abnormality noted.  IMPRESSION: No acute abnormality noted.   Electronically Signed  By: Jeffrey Guerrero M.D.  On: 11/26/2014 14:48   Impression:  The patient is recovering quickly and looks very good approximately 3 weeks  s/p Homograft Pulmonic Valve Replacement performed on 11/01/14. Patient remains asymptomatic and without concerns today.  He has been increasing his exercise and ADL's comfortably without pain or complication.     Plan:  Continue to increase exercise and daily activity.  Resume driving slowly during the day only when not in pain and not taking pain medications. Limit heavy lifting, use of arms/shoulders, and strenuous work until 3 months post-op. Cardiac Rehab encouraged to start as soon as possible Continue to follow with Dr. Harrington Guerrero Follow up in 11 months (approximately 1 year post-op) or sooner if surgical questions/concerns arise.   Roxan Hockey, PA-S 11/26/14   I have seen and examined the patient in conjunction with Mr Harvie Heck and agree with the assessment and plan as outlined.   Valentina Gu. Roxy Manns, MD 11/26/2014 2:53 PM

## 2014-11-27 ENCOUNTER — Encounter: Payer: 59 | Attending: Physician Assistant

## 2014-11-27 VITALS — Ht 73.0 in | Wt 191.7 lb

## 2014-11-27 DIAGNOSIS — E119 Type 2 diabetes mellitus without complications: Secondary | ICD-10-CM

## 2014-11-27 DIAGNOSIS — Z713 Dietary counseling and surveillance: Secondary | ICD-10-CM | POA: Diagnosis not present

## 2014-11-27 NOTE — Progress Notes (Signed)

## 2014-12-03 ENCOUNTER — Telehealth (HOSPITAL_COMMUNITY): Payer: Self-pay | Admitting: Cardiac Rehabilitation

## 2014-12-03 NOTE — Telephone Encounter (Signed)
pc to pt to assess readiness for cardiac rehab.  Pt states he is not interested.  Dr. [name] made aware.

## 2014-12-04 DIAGNOSIS — R739 Hyperglycemia, unspecified: Secondary | ICD-10-CM

## 2014-12-04 DIAGNOSIS — E119 Type 2 diabetes mellitus without complications: Secondary | ICD-10-CM | POA: Diagnosis not present

## 2014-12-04 NOTE — Progress Notes (Signed)

## 2014-12-11 DIAGNOSIS — E119 Type 2 diabetes mellitus without complications: Secondary | ICD-10-CM | POA: Diagnosis not present

## 2014-12-11 NOTE — Progress Notes (Signed)
Patient was seen on 12/11/14 for the third of a series of three diabetes self-management courses at the [facility]. The following learning objectives were met by the patient during this class:  . State the amount of activity recommended for healthy living . Describe activities suitable for individual needs . Identify ways to regularly incorporate activity into daily life . Identify barriers to activity and ways to over come these barriers  Identify diabetes medications being personally used and their primary action for lowering glucose and possible side effects . Describe role of stress on blood glucose and develop strategies to address psychosocial issues . Identify diabetes complications and ways to prevent them  Explain how to manage diabetes during illness . Evaluate success in meeting personal goal . Establish 2-3 goals that they will plan to diligently work on until they return for the  62-monthfollow-up visit  Goals:   I will count my carb choices at most meals and snacks  I will be active 15 minutes or more 2 times a week  I will take my diabetes medications as scheduled  To help manage stress I will  read at least 7 times a week  Your patient has identified these potential barriers to change:  None noted  Your patient has identified their diabetes self-care support plan as  Family Support On-line Resources Plan:  Attend Core 4 in 4 months

## 2014-12-27 ENCOUNTER — Other Ambulatory Visit: Payer: Self-pay | Admitting: Thoracic Surgery (Cardiothoracic Vascular Surgery)

## 2015-01-03 ENCOUNTER — Other Ambulatory Visit: Payer: Self-pay | Admitting: Thoracic Surgery (Cardiothoracic Vascular Surgery)

## 2015-01-03 ENCOUNTER — Other Ambulatory Visit (INDEPENDENT_AMBULATORY_CARE_PROVIDER_SITE_OTHER): Payer: 59 | Admitting: *Deleted

## 2015-01-03 ENCOUNTER — Encounter: Payer: Self-pay | Admitting: Internal Medicine

## 2015-01-03 ENCOUNTER — Ambulatory Visit (INDEPENDENT_AMBULATORY_CARE_PROVIDER_SITE_OTHER): Payer: 59 | Admitting: Internal Medicine

## 2015-01-03 VITALS — BP 129/72 | HR 52 | Ht 73.0 in | Wt 200.0 lb

## 2015-01-03 DIAGNOSIS — I371 Nonrheumatic pulmonary valve insufficiency: Secondary | ICD-10-CM

## 2015-01-03 DIAGNOSIS — Z954 Presence of other heart-valve replacement: Secondary | ICD-10-CM

## 2015-01-03 NOTE — Patient Instructions (Signed)
Your physician recommends that you continue on your current medications as directed. Please refer to the Current Medication list given to you today. Your physician wants you to follow-up in: November 2016 WITH DR ROSS.  You will receive a reminder letter in the mail two months in advance. If you don't receive a letter, please call our office to schedule the follow-up appointment. Your physician recommends that you have lab work done at your Dr. Gabriel Carina (Lipids, CBC) with results faxed to Dr. [name] [phone].

## 2015-01-03 NOTE — Progress Notes (Signed)
Cardiology Office Note   Date:  01/03/2015   ID:  Jeffrey Guerrero, DOB 10-Jun-1951, MRN [id number]  PCP:  [name], MD  Cardiologist:   Dorris Carnes, MD   Patinet returns for f/u of PV insufficiency , s/p PVR    History of Present Illness: Jeffrey Guerrero is a 64 y.o. male with a history of sepsis and pulmonic valve endocarditis with resutant severe pulmonic insufficiency I saw the patient earlier this winter  On 1/14 he underwent PVR   He has been seen post op by Remus Loffler   Echo in Feb showed mild PI  Peak grad across valve 18 mm Hg    Since surgery he has felt well  Breathing is good  Active  No SOB  No CP No palp  No F/C    Current Outpatient Prescriptions  Medication Sig Dispense Refill  . amLODipine (NORVASC) 10 MG tablet Take 1 tablet (10 mg total) by mouth daily.    Marland Kitchen aspirin EC 325 MG EC tablet Take 1 tablet (325 mg total) by mouth daily. 30 tablet 0  . levothyroxine (SYNTHROID, LEVOTHROID) 100 MCG tablet Take 100 mcg by mouth daily before breakfast.    . metFORMIN (GLUCOPHAGE) 500 MG tablet Take 1 tablet (500 mg total) by mouth 2 (two) times daily.    . metoprolol tartrate (LOPRESSOR) 25 MG tablet Take 0.5 tablets (12.5 mg total) by mouth 2 (two) times daily. 30 tablet 1  . oxyCODONE (OXY IR/ROXICODONE) 5 MG immediate release tablet Take 1-2 tablets (5-10 mg total) by mouth every 4 (four) hours as needed for severe pain. (Patient not taking: Reported on 01/03/2015) 30 tablet 0   No current facility-administered medications for this visit.    Allergies:   Review of patient's allergies indicates no known allergies.   Past Medical History  Diagnosis Date  . Hypertension   . Thyroid disease   . Hypothyroidism   . Arthritis     "knees" (02/13/2014)  . Bacterial endocarditis - pulmonic valve vegetation with Streptococcus pneumoniae sepsis 02/16/2014    Pulmonic valve vegetations on TEE with blood cultures positive for Streptococcus pneumoniae, complicated by pulmonary emboli    . Pulmonic valve insufficiency 02/16/2014    severe  . Diabetes mellitus without complication     takes metformin  . S/P pulmonic valve replacement with homograft 11/01/2014    Past Surgical History  Procedure Laterality Date  . Tonsillectomy    . Appendectomy    . Tee without cardioversion [name] 02/16/2014    Procedure: TRANSESOPHAGEAL ECHOCARDIOGRAM (TEE);  Surgeon: [name], MD;  Location: [facility] ENDOSCOPY;  Service: Cardiovascular;  Laterality: [name];  . Tee without cardioversion [name] 04/12/2014    Procedure: TRANSESOPHAGEAL ECHOCARDIOGRAM (TEE);  Surgeon: [name], MD;  Location: [facility] ENDOSCOPY;  Service: Cardiovascular;  Laterality: [name];  . Left heart catheterization with coronary angiogram [name] 10/09/2014    Procedure: LEFT HEART CATHETERIZATION WITH CORONARY ANGIOGRAM;  Surgeon: Jettie Booze, MD;  Location: [facility] CATH LAB;  Service: Cardiovascular;  Laterality: [name];  . Colonoscopy    . Aortic valve replacement [name] 11/01/2014    Procedure: PULMONIC HOMOGRAFT VALVE REPLACEMENT ;  Surgeon: Rexene Alberts, MD;  Location: [place];  Service: Open Heart Surgery;  Laterality: [name];  . Tee without cardioversion [name] 11/01/2014    Procedure: TRANSESOPHAGEAL ECHOCARDIOGRAM (TEE);  Surgeon: Rexene Alberts, MD;  Location: [place];  Service: Open Heart Surgery;  Laterality: [name];     Social History:  The patient  reports that he has never smoked. He has never used smokeless tobacco. He reports that he drinks about 1.2 oz of alcohol per week. He reports that he does not use illicit drugs.   Family History:  The patient's family history is negative for Heart disease.    ROS:  Please see the history of present illness. All other systems are reviewed and  Negative to the above problem except as noted.    PHYSICAL EXAM: VS:  BP 129/72 mmHg  Pulse 52  Ht 6\' 1"  (1.854 m)  Wt 200 lb (90.719 kg)  BMI 26.39 kg/m2  GEN: Well nourished, well developed, in no acute distress HEENT: normal Neck: no JVD,  carotid bruits, or masses Cardiac: RRR; Gr I/VI systolic murmur LUSB  or gallops,no edema  Respiratory:  clear to auscultation bilaterally, normal work of breathing GI: soft, nontender, nondistended, + BS  No hepatomegaly  MS: no deformity Moving all extremities   Skin: warm and dry, no rash Neuro:  Strength and sensation are intact Psych: euthymic mood, full affect   EKG:  EKG is not  ordered today.   Lipid Panel No results found for: CHOL, TRIG, HDL, CHOLHDL, VLDL, LDLCALC, LDLDIRECT    Wt Readings from Last 3 Encounters:  01/03/15 200 lb (90.719 kg)  11/27/14 191 lb 11.2 oz (86.955 kg)  11/26/14 185 lb (83.915 kg)      ASSESSMENT AND PLAN: 1  PV  S/p PVR in Jan  Doing very well  Would follow up next winter.  2.  HCM  Will check lipids when he gets his labs at primary care   Current medicines are reviewed at length with the patient today.  The patient does not have concerns regarding medicines.  The following changes have been made:  81 mg ASA   Labs/ tests ordered today include: Lipid and CBC to be drawn at primary MD No orders of the defined types were placed in this encounter.     Disposition:   FU with  Me in December    Signed, Dorris Carnes, MD  01/03/2015 8:34 AM    [address], [place], [place]  [address] Phone: [phone]; Fax: [phone]

## 2015-01-07 ENCOUNTER — Other Ambulatory Visit: Payer: Self-pay | Admitting: Thoracic Surgery (Cardiothoracic Vascular Surgery)

## 2015-01-11 ENCOUNTER — Other Ambulatory Visit: Payer: Self-pay | Admitting: Thoracic Surgery (Cardiothoracic Vascular Surgery)

## 2015-01-16 ENCOUNTER — Other Ambulatory Visit: Payer: Self-pay | Admitting: Thoracic Surgery (Cardiothoracic Vascular Surgery)

## 2015-01-17 ENCOUNTER — Other Ambulatory Visit: Payer: Self-pay | Admitting: Thoracic Surgery (Cardiothoracic Vascular Surgery)

## 2015-01-17 ENCOUNTER — Other Ambulatory Visit: Payer: Self-pay

## 2015-01-17 MED ORDER — METOPROLOL TARTRATE 25 MG PO TABS: 12.5000 mg | ORAL_TABLET | Freq: Two times a day (BID) | ORAL | Status: DC

## 2015-02-12 ENCOUNTER — Telehealth: Payer: Self-pay | Admitting: Internal Medicine

## 2015-02-12 NOTE — Telephone Encounter (Signed)
Pt called wanting lab results but realized he had lab work completed at [facility] and he was calling the wrong office.

## 2015-02-12 NOTE — Telephone Encounter (Signed)
New message     Patient calling for test results,.

## 2015-06-30 IMAGING — CR DG CHEST 2V
2 series · 2 of 2 positions shown · non-contrast
Comparison: None.

CLINICAL DATA: STATUS POST FALL EARLIER TODAY, LOSS CONSCIOUSNESS

EXAM:
CHEST  2 VIEW

[w chest pa]
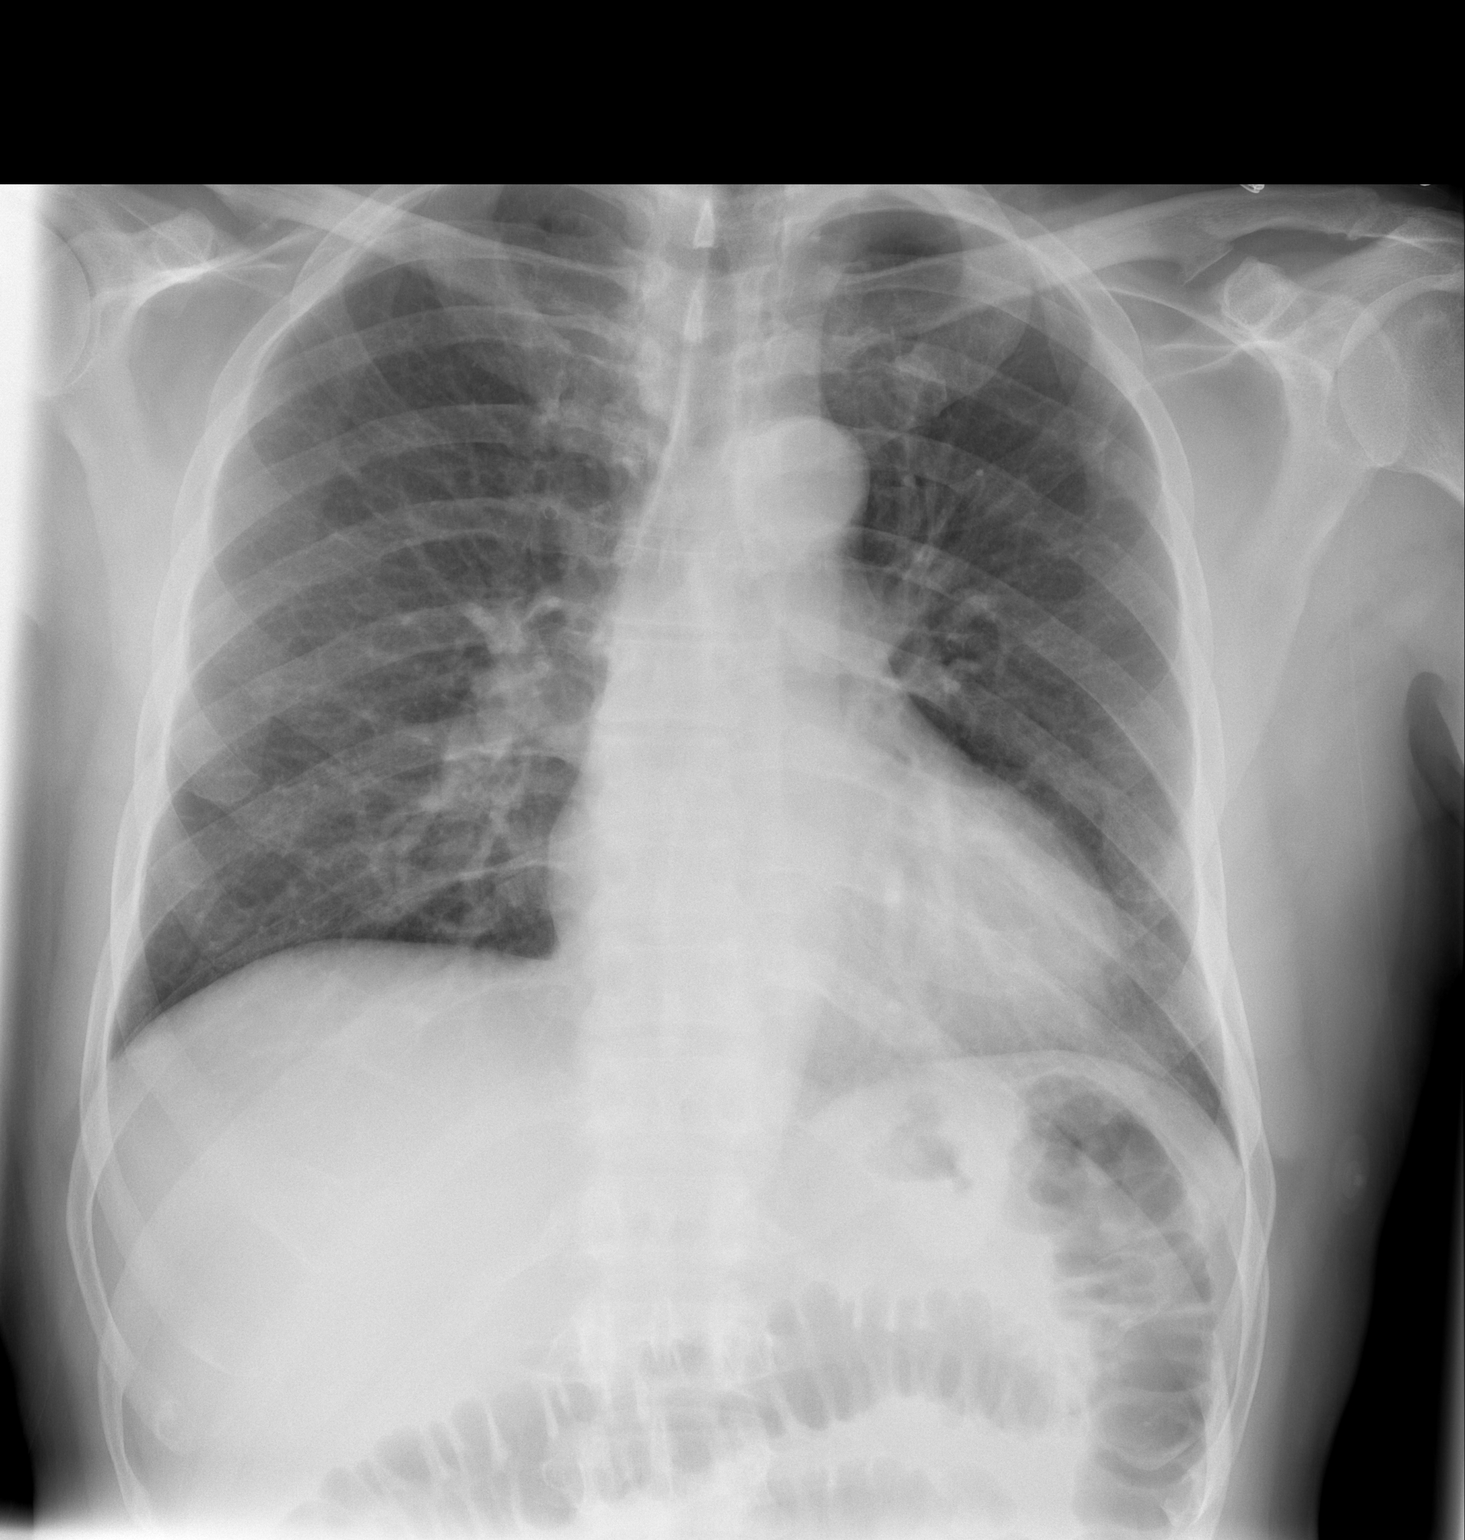

[w chest lat]
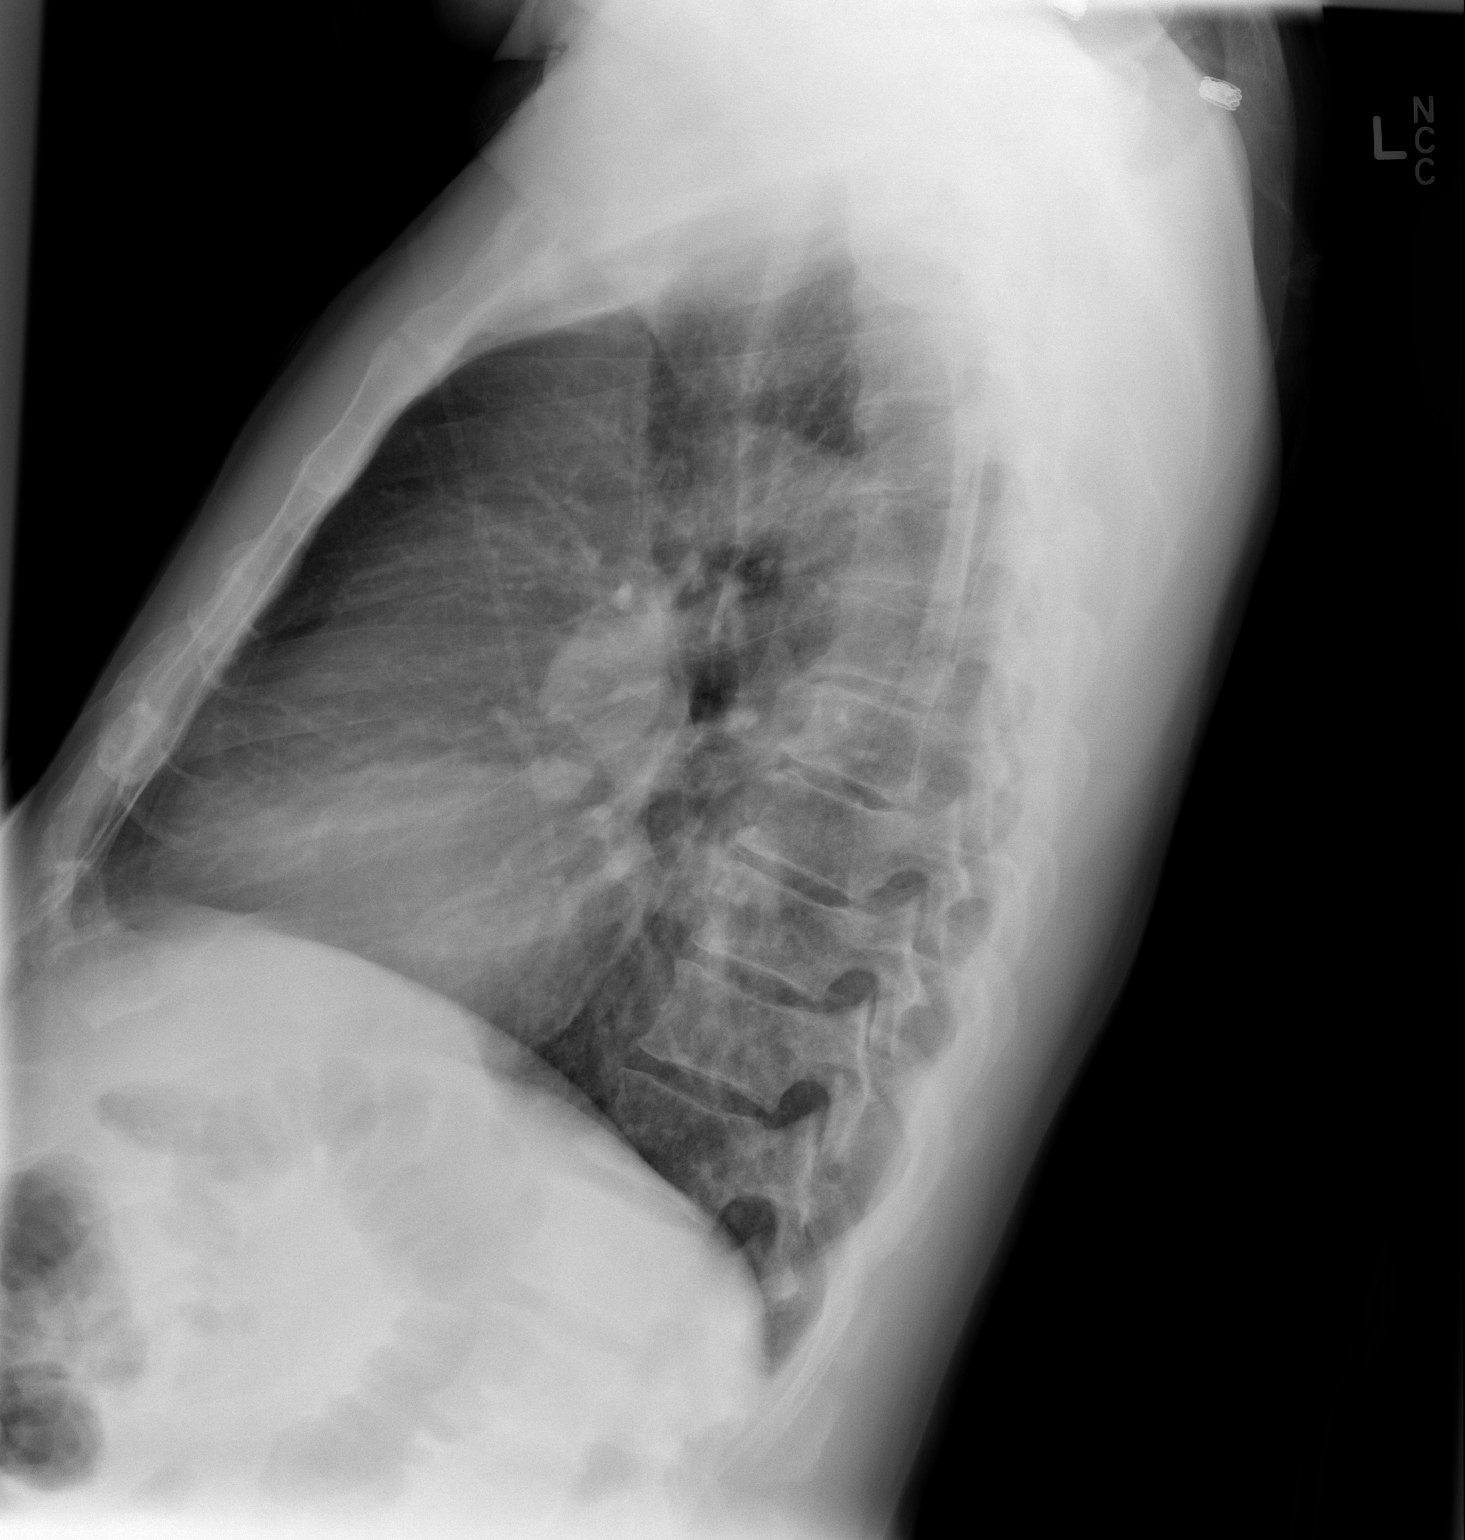

[2 of 2 positions shown; findings below may reference images not displayed]

FINDINGS: The heart size and mediastinal contours are within normal limits.
Both lungs are clear. The visualized skeletal structures are
unremarkable.
IMPRESSION: No active cardiopulmonary disease.

## 2015-06-30 IMAGING — CT CT ABD-PELV W/O CM
2 of 4 series · 15 of 46 positions shown, 17 images · non-contrast
Comparison: None.

CLINICAL DATA: Evaluate abdominal aortic aneurysm

EXAM:
CT ABDOMEN AND PELVIS WITHOUT CONTRAST
TECHNIQUE: Multidetector CT imaging of the abdomen and pelvis was performed
following the standard protocol without intravenous contrast.

[Series 2: abd/ pelvis 5.0 i30f 1 · axial · 0.72mm/px · z∈[+339,+779]mm · 12 of 98 slices shown, 14 images]
[im 5/98  soft-tissue]
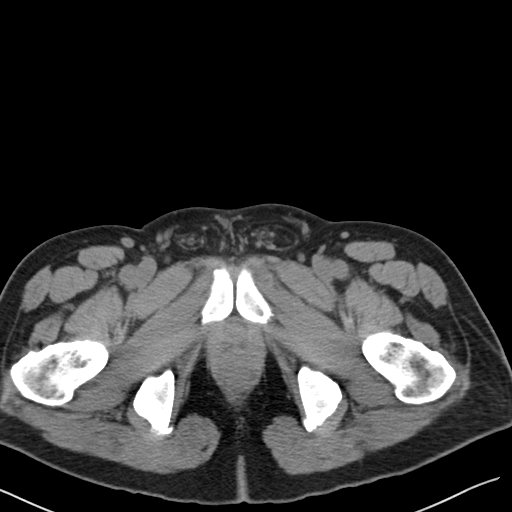
[im 5/98  bone]
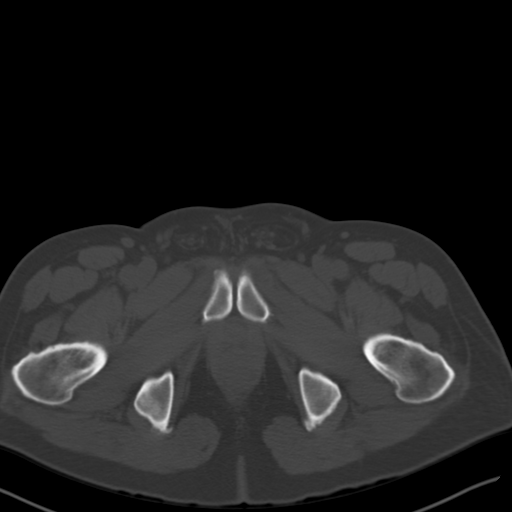
[im 14/98  soft-tissue]
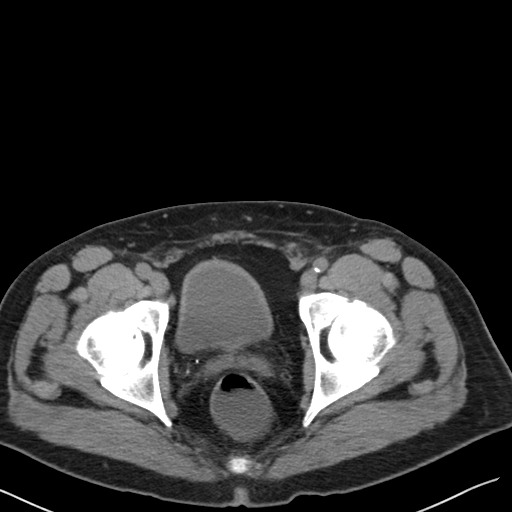
[im 23/98  soft-tissue]
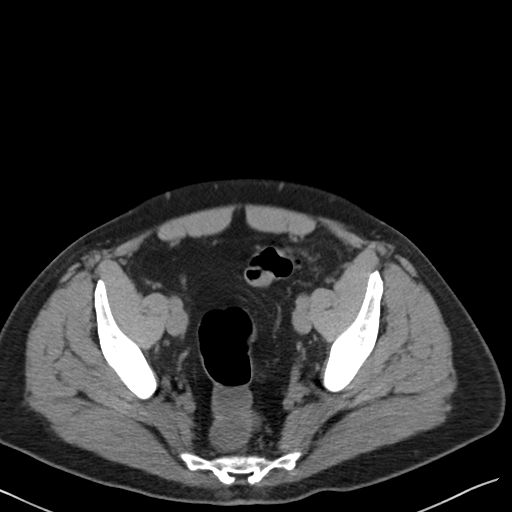
[im 31/98  soft-tissue]
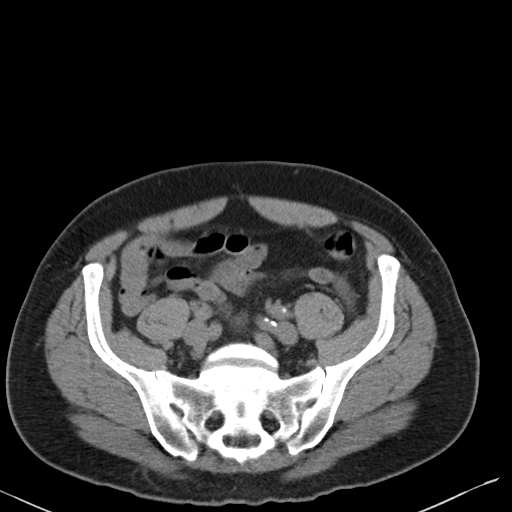
[im 36/98  soft-tissue]
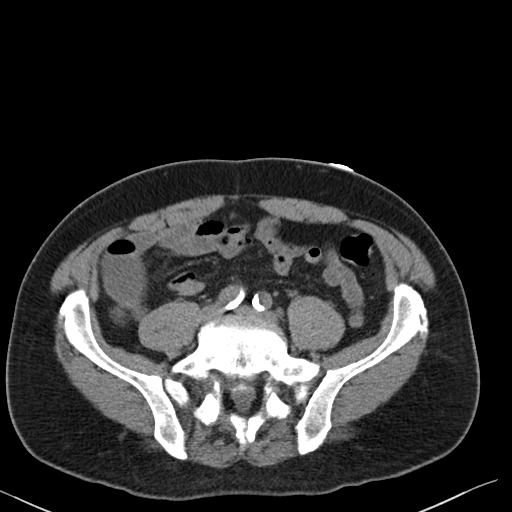
[im 45/98  soft-tissue]
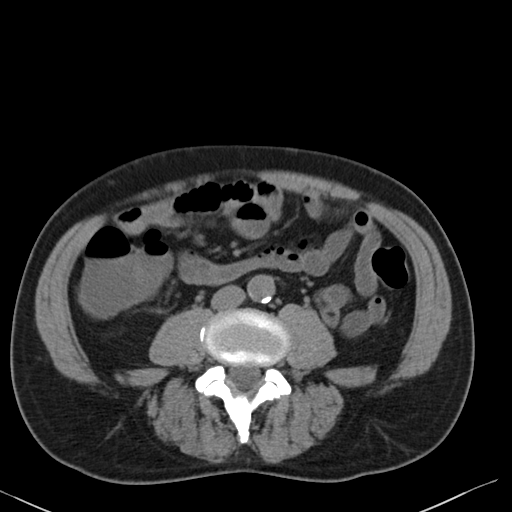
[im 53/98  soft-tissue]
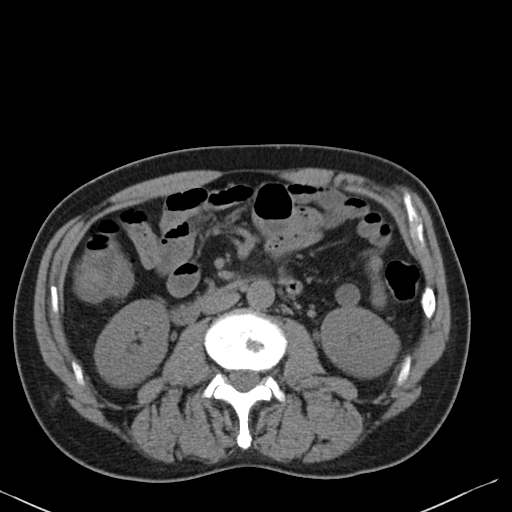
[im 62/98  soft-tissue]
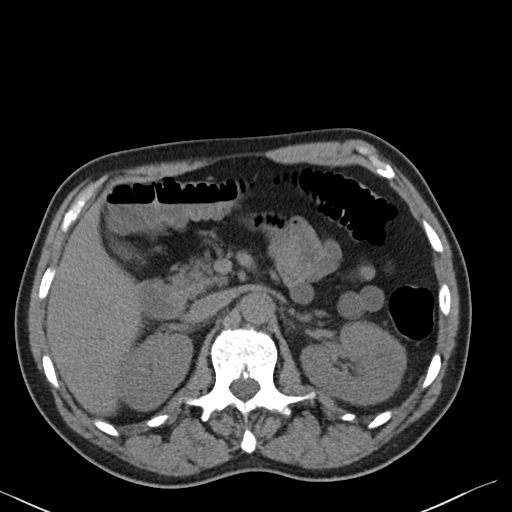
[im 67/98  soft-tissue]
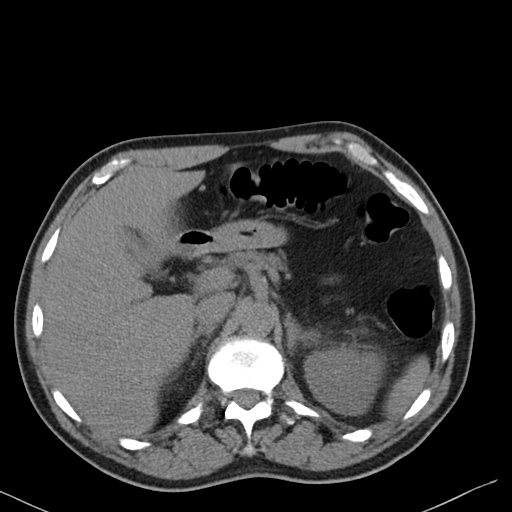
[im 67/98  bone]
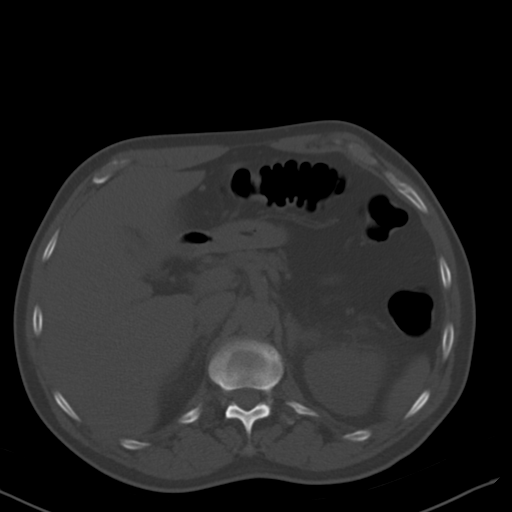
[im 75/98  soft-tissue]
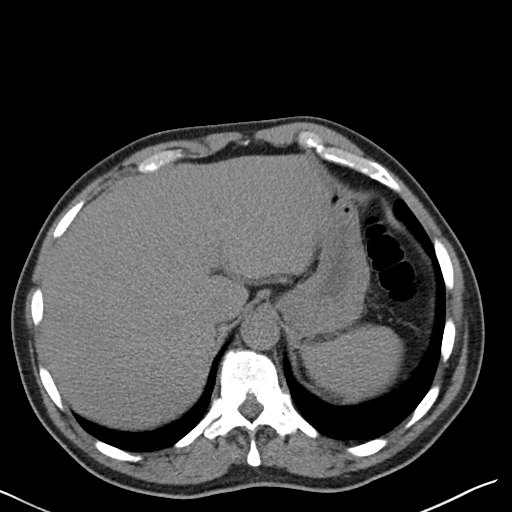
[im 84/98  soft-tissue]
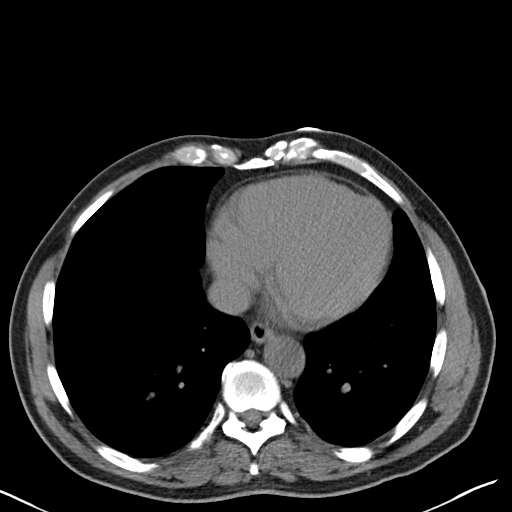
[im 93/98  soft-tissue]
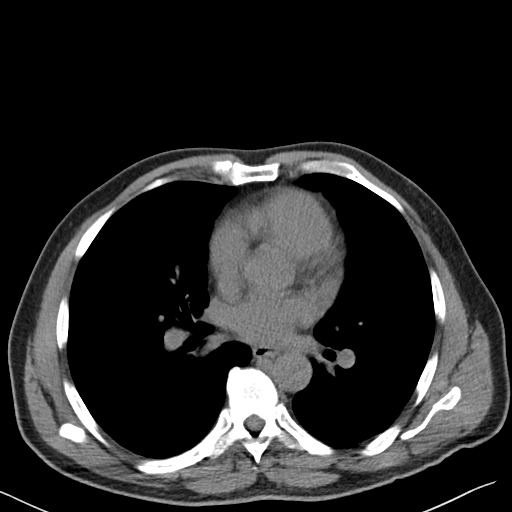

[Series 5: coronals · coronal · 0.68mm/px · 3 of 125 slices shown]
[im 42/125  soft-tissue]
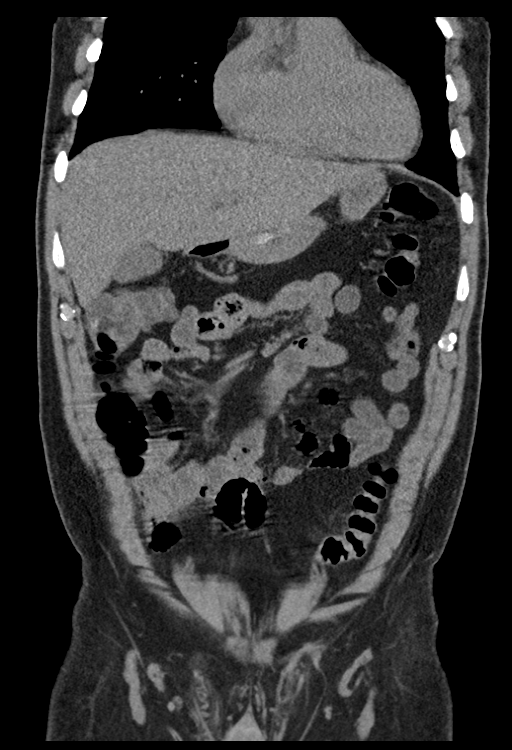
[im 56/125  soft-tissue]
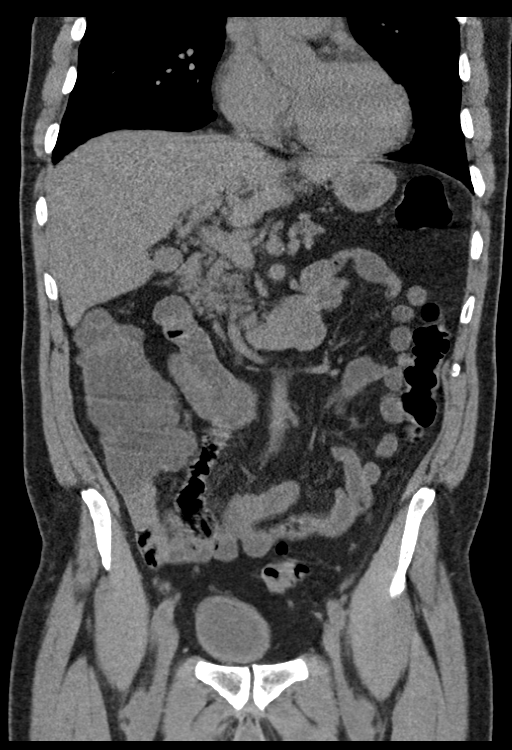
[im 69/125  soft-tissue]
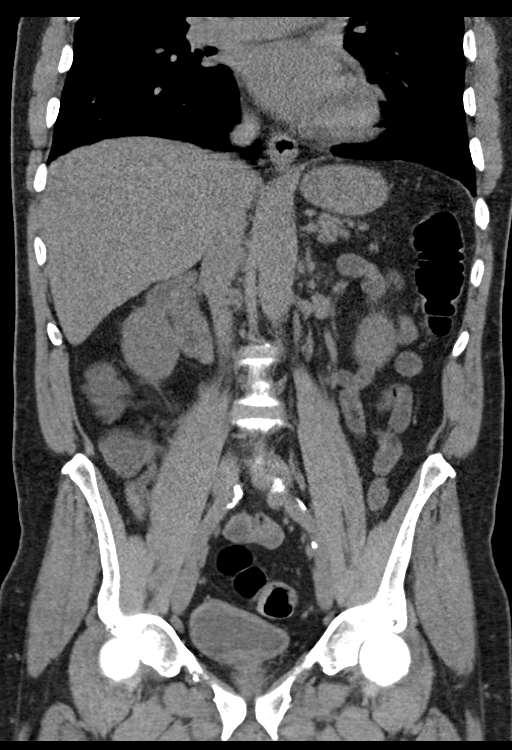

[15 of 46 positions shown; findings below may reference images not displayed]

FINDINGS: The caliber of the abdominal aorta is normal and exhibits a normal
tapered contour. There is mural calcification in the infrarenal
aorta and common iliac arteries. The maximal diameter of the right
common iliac artery is 1.5 cm. The maximal diameter of the left
common iliac artery is 1.3 cm. The periaortic and pericaval soft
tissues are normal in appearance.

The liver, gallbladder, pancreas, spleen, and adrenal glands are
normal in appearance. The kidneys exhibit no evidence of obstruction
or of calcified stones. In the lateral aspect of the upper pole of
the left kidney there is a hypodensity measuring approximately 1.8 x
2.1 cm which exhibits Hounsfield measurement of -4 and likely
reflects a cyst. The perinephric fat is normal in density. The
non-opacified loops of small and large bowel exhibit no evidence of
ileus or obstruction or acute inflammation. A normal calibered,
uninflamed-appearing appendix is demonstrated on images 64-70 of
series 2. Within the pelvis the urinary bladder, prostate gland, and
seminal vesicles exhibit no acute abnormalities. There is no
inguinal nor significant umbilical hernia. The psoas musculature is
normal in appearance. The lumbar vertebral bodies are preserved in
height. There are bilateral pars defects at L5 without evidence of
spondylolisthesis. The bony pelvis exhibits no acute abnormalities.
The lung bases are clear.
IMPRESSION: 1. There is no evidence of an abdominal aortic aneurysm. No iliac
artery aneurysm or splenic artery aneurysm is demonstrated. The
periaortic and pericaval regions appear normal.
2. There is no acute hepatobiliary abnormality nor acute urinary
tract abnormality. A hypodensity in the upper pole of the left
kidney likely reflects a cyst.
3. There is no acute bowel abnormality.
4. There are bilateral pars defects at L5 without evidence of
spondylolisthesis.

## 2015-08-12 ENCOUNTER — Telehealth: Payer: Self-pay

## 2015-08-12 NOTE — Telephone Encounter (Signed)
Patient LMOVM asking if we had received his records for Dr. Lorelei Pont.    [phone]

## 2015-08-14 NOTE — Telephone Encounter (Signed)
Do not see any records scanned into chart for this patient and there is nothing in Dr. Lillie Fragmin box on this patient did he say where the records were coming from? Did we send a request or did he do it himself?

## 2015-08-15 NOTE — Telephone Encounter (Signed)
Spoke with patient. He has a copy of his records for [facility] on [address] He has an appt scheduled with Dr. Lorelei Pont for a CPE on 09/18/15. I told him he can bring his records by for [place] to copy and place in her box or he can bring them on his appt day to review and we can copy them then. He understood.

## 2015-08-15 NOTE — Telephone Encounter (Signed)
Left message for patient to call back. Did not see any records for this patient. Not sure which office or provider records are coming from. Need more information. Hasn't been seen here in a long time.

## 2015-08-15 NOTE — Telephone Encounter (Signed)
Patient left voicemail today at 1151 requesting a call back. He states records were supposed to be sent to [place] [facility] [place] [address]. Patient call back # is [phone] (work # 7am-3:30pm) or [phone] (home #).

## 2015-09-18 ENCOUNTER — Ambulatory Visit (INDEPENDENT_AMBULATORY_CARE_PROVIDER_SITE_OTHER): Payer: 59 | Admitting: Family Medicine

## 2015-09-18 ENCOUNTER — Encounter: Payer: Self-pay | Admitting: Family Medicine

## 2015-09-18 VITALS — BP 148/85 | HR 62 | Temp 98.6°F | Resp 16 | Ht 71.25 in | Wt 198.0 lb

## 2015-09-18 DIAGNOSIS — Z0000 Encounter for general adult medical examination without abnormal findings: Secondary | ICD-10-CM | POA: Diagnosis not present

## 2015-09-18 DIAGNOSIS — Z130 Encounter for screening for diseases of the blood and blood-forming organs and certain disorders involving the immune mechanism: Secondary | ICD-10-CM

## 2015-09-18 DIAGNOSIS — I10 Essential (primary) hypertension: Secondary | ICD-10-CM | POA: Diagnosis not present

## 2015-09-18 DIAGNOSIS — D72819 Decreased white blood cell count, unspecified: Secondary | ICD-10-CM | POA: Diagnosis not present

## 2015-09-18 DIAGNOSIS — E119 Type 2 diabetes mellitus without complications: Secondary | ICD-10-CM | POA: Diagnosis not present

## 2015-09-18 DIAGNOSIS — E034 Atrophy of thyroid (acquired): Secondary | ICD-10-CM | POA: Diagnosis not present

## 2015-09-18 DIAGNOSIS — E038 Other specified hypothyroidism: Secondary | ICD-10-CM | POA: Diagnosis not present

## 2015-09-18 DIAGNOSIS — Z125 Encounter for screening for malignant neoplasm of prostate: Secondary | ICD-10-CM

## 2015-09-18 DIAGNOSIS — Z23 Encounter for immunization: Secondary | ICD-10-CM

## 2015-09-18 DIAGNOSIS — Z13220 Encounter for screening for lipoid disorders: Secondary | ICD-10-CM | POA: Diagnosis not present

## 2015-09-18 DIAGNOSIS — Z1211 Encounter for screening for malignant neoplasm of colon: Secondary | ICD-10-CM

## 2015-09-18 LAB — LIPID PANEL
CHOL/HDL RATIO: 4 ratio (ref <=5.0)
CHOLESTEROL: 183 mg/dL (ref 125–200)
HDL: 46 mg/dL (ref >=40)
LDL Cholesterol: 123 mg/dL (ref <130)
TRIGLYCERIDES: 72 mg/dL (ref <150)
VLDL: 14 mg/dL (ref <30)

## 2015-09-18 LAB — CBC
HCT: 42.2 % (ref 39.0–52.0)
HEMOGLOBIN: 14 g/dL (ref 13.0–17.0)
MCH: 29.7 pg (ref 26.0–34.0)
MCHC: 33.2 g/dL (ref 30.0–36.0)
MCV: 89.6 fL (ref 78.0–100.0)
MPV: 10.6 fL (ref 8.6–12.4)
Platelets: 204 10*3/uL (ref 150–400)
RBC: 4.71 MIL/uL (ref 4.22–5.81)
RDW: 14.5 % (ref 11.5–15.5)
WBC: 3.3 10*3/uL — ABNORMAL LOW (ref 4.0–10.5)

## 2015-09-18 LAB — COMPREHENSIVE METABOLIC PANEL
ALK PHOS: 44 U/L (ref 40–115)
ALT: 24 U/L (ref 9–46)
AST: 18 U/L (ref 10–35)
Albumin: 3.9 g/dL (ref 3.6–5.1)
BUN: 17 mg/dL (ref 7–25)
CHLORIDE: 105 mmol/L (ref 98–110)
CO2: 27 mmol/L (ref 20–31)
CREATININE: 0.87 mg/dL (ref 0.70–1.25)
Calcium: 9 mg/dL (ref 8.6–10.3)
Glucose, Bld: 95 mg/dL (ref 65–99)
Potassium: 3.7 mmol/L (ref 3.5–5.3)
SODIUM: 141 mmol/L (ref 135–146)
TOTAL PROTEIN: 6.7 g/dL (ref 6.1–8.1)
Total Bilirubin: 0.7 mg/dL (ref 0.2–1.2)

## 2015-09-18 LAB — TSH: TSH: 1.573 u[IU]/mL (ref 0.350–4.500)

## 2015-09-18 LAB — HEMOGLOBIN A1C
Hgb A1c MFr Bld: 6.2 % — ABNORMAL HIGH (ref <5.7)
Mean Plasma Glucose: 131 mg/dL — ABNORMAL HIGH (ref <117)

## 2015-09-18 MED ORDER — METFORMIN HCL 500 MG PO TABS: 500.0000 mg | ORAL_TABLET | Freq: Two times a day (BID) | ORAL | Status: DC

## 2015-09-18 MED ORDER — AMLODIPINE BESYLATE 5 MG PO TABS: 5.0000 mg | ORAL_TABLET | Freq: Every day | ORAL | Status: DC

## 2015-09-18 NOTE — Progress Notes (Signed)
[facility] [address] [place] [address] [phone]  Date:  09/18/2015   Name:  Jeffrey Guerrero   DOB:  August 30, 1951   MRN:  [id number]  PCP:  [name], MD    Chief Complaint: Annual Exam and Medication Refill   History of Present Illness:  Jeffrey Guerrero is a 64 y.o. very pleasant male patient who presents with the following:  Here today for a CPE.  Pt of Dr. [name] for his heart problems.   He had a human allograft valve early this year. Also with history of DM and hypothyroidism Last colonoscopy more than 10 years ago He has an eye appt in January  Flu shot is UTD He does not recall any pneumonia shot- would like to get one today  Lab Results  Component Value Date   HGBA1C 6.2* 10/29/2014   BP Readings from Last 3 Encounters:  09/18/15 148/90  01/03/15 129/72  11/26/14 117/70      Patient Active Problem List   Diagnosis Date Noted  . S/P pulmonic valve replacement with homograft 11/01/2014  . Preoperative cardiovascular examination   . Severe pulmonic insufficiency   . Adult hypothyroidism 06/12/2014  . Hyperglycemia 04/02/2014  . Pulmonic valve disease 04/02/2014  . Multiple skin nodules 04/02/2014  . Acute pulmonary embolism ([place]) 02/17/2014  . Other pulmonary embolism and infarction 02/17/2014  . Bacterial endocarditis - pulmonic valve vegetation with Streptococcus pneumoniae sepsis 02/16/2014  . Pulmonic valve insufficiency 02/16/2014  . Unspecified hypothyroidism 02/14/2014  . Essential hypertension, benign 02/14/2014  . Leukocytopenia, unspecified 02/14/2014  . Gram-positive bacteremia 02/14/2014  . ARF (acute renal failure) ([place]) 02/14/2014  . HTN (hypertension) 02/14/2014  . Benign essential HTN 02/14/2014  . Septic shock ([place]) 02/12/2014    Past Medical History  Diagnosis Date  . Hypertension   . Thyroid disease   . Hypothyroidism   . Arthritis     "knees" (02/13/2014)  . Bacterial endocarditis - pulmonic valve  vegetation with Streptococcus pneumoniae sepsis 02/16/2014    Pulmonic valve vegetations on TEE with blood cultures positive for Streptococcus pneumoniae, complicated by pulmonary emboli   . Pulmonic valve insufficiency 02/16/2014    severe  . Diabetes mellitus without complication ([place])     takes metformin  . S/P pulmonic valve replacement with homograft 11/01/2014    Past Surgical History  Procedure Laterality Date  . Tonsillectomy    . Appendectomy    . Tee without cardioversion [name] 02/16/2014    Procedure: TRANSESOPHAGEAL ECHOCARDIOGRAM (TEE);  Surgeon: [name], MD;  Location: [facility] ENDOSCOPY;  Service: Cardiovascular;  Laterality: [name];  . Tee without cardioversion [name] 04/12/2014    Procedure: TRANSESOPHAGEAL ECHOCARDIOGRAM (TEE);  Surgeon: [name], MD;  Location: [facility] ENDOSCOPY;  Service: Cardiovascular;  Laterality: [name];  . Left heart catheterization with coronary angiogram [name] 10/09/2014    Procedure: LEFT HEART CATHETERIZATION WITH CORONARY ANGIOGRAM;  Surgeon: Jettie Booze, MD;  Location: [facility] CATH LAB;  Service: Cardiovascular;  Laterality: [name];  . Colonoscopy    . Aortic valve replacement [name] 11/01/2014    Procedure: PULMONIC HOMOGRAFT VALVE REPLACEMENT ;  Surgeon: Rexene Alberts, MD;  Location: [place];  Service: Open Heart Surgery;  Laterality: [name];  . Tee without cardioversion [name] 11/01/2014    Procedure: TRANSESOPHAGEAL ECHOCARDIOGRAM (TEE);  Surgeon: Rexene Alberts, MD;  Location: [place];  Service: Open Heart Surgery;  Laterality: [name];    Social History  Substance Use Topics  . Smoking status:  Never Smoker   . Smokeless tobacco: Never Used  . Alcohol Use: Yes     Comment: occasional    Family History  Problem Relation Age of Onset  . Heart disease Neg Hx     No Known Allergies  Medication list has been reviewed and updated.  Current Outpatient Prescriptions on File Prior to Visit  Medication Sig Dispense Refill  . amLODipine (NORVASC) 10 MG tablet Take 1  tablet (10 mg total) by mouth daily.    Marland Kitchen levothyroxine (SYNTHROID, LEVOTHROID) 100 MCG tablet Take 100 mcg by mouth daily before breakfast.    . metFORMIN (GLUCOPHAGE) 500 MG tablet Take 1 tablet (500 mg total) by mouth 2 (two) times daily.    . metoprolol tartrate (LOPRESSOR) 25 MG tablet TAKE ONE-HALF TABLET (12.5 MG TOTAL) BY MOUTH 2 (TWO) TIMES DAILY 30 tablet 1  . metoprolol tartrate (LOPRESSOR) 25 MG tablet Take 0.5 tablets (12.5 mg total) by mouth 2 (two) times daily. 30 tablet 8  . oxyCODONE (OXY IR/ROXICODONE) 5 MG immediate release tablet Take 1-2 tablets (5-10 mg total) by mouth every 4 (four) hours as needed for severe pain. (Patient not taking: Reported on 01/03/2015) 30 tablet 0   No current facility-administered medications on file prior to visit.    Review of Systems:  As per HPI- otherwise negative.   Physical Examination: Filed Vitals:   09/18/15 1249  BP: 148/90  Pulse: 62  Temp: 98.6 F (37 C)  Resp: 16   Filed Vitals:   09/18/15 1249  Height: 5' 11.25" (1.81 m)  Weight: 198 lb (89.812 kg)   Body mass index is 27.41 kg/(m^2). Ideal Body Weight: Weight in (lb) to have BMI = 25: 180.1  GEN: WDWN, NAD, Non-toxic, A & O x 3, looks well HEENT: Atraumatic, Normocephalic. Neck supple. No masses, No LAD.  Bilateral TM wnl, oropharynx normal.  PEERL,EOMI.   Ears and Nose: No external deformity. CV: RRR, No M/G/R. No JVD. No thrill. No extra heart sounds. PULM: CTA B, no wheezes, crackles, rhonchi. No retractions. No resp. distress. No accessory muscle use. ABD: S, NT, ND. No rebound. No HSM. EXTR: No c/c/e NEURO Normal gait.  PSYCH: Normally interactive. Conversant. Not depressed or anxious appearing.  Calm demeanor.    Assessment and Plan: Physical exam - Plan: amLODipine (NORVASC) 5 MG tablet  Screening for prostate cancer - Plan: PSA  Essential hypertension - Plan: Comprehensive metabolic panel  Controlled type 2 diabetes mellitus without complication,  without long-term current use of insulin (HCC) - Plan: Hemoglobin A1c, Microalbumin, urine, metFORMIN (GLUCOPHAGE) 500 MG tablet  Screening for hyperlipidemia - Plan: Lipid panel  Screening for deficiency anemia - Plan: CBC  Hypothyroidism due to acquired atrophy of thyroid - Plan: TSH  Immunization due - Plan: Pneumococcal conjugate vaccine 13-valent IM  Screening for colon cancer - Plan: Ambulatory referral to Gastroenterology  Prevnar, labs and refills today Will plan further follow- up pending labs.  BP is slightly high- he is seeing Dr. [name] soon, and is generally ok.  Will continue current medication dose for now  Signed Lamar Blinks, MD

## 2015-09-18 NOTE — Patient Instructions (Signed)
It was very nice to see you today.  I have put in a referral for you to have a colonoscopy- I asked for them to schedule your visit in February I will be in touch with your labs asap  Please ask your eye doctor if they can send [place] a copy of your report after your next visit.

## 2015-09-19 ENCOUNTER — Encounter: Payer: Self-pay | Admitting: Family Medicine

## 2015-09-19 LAB — PSA: PSA: 0.66 ng/mL (ref <=4.00)

## 2015-09-19 LAB — MICROALBUMIN, URINE: Microalb, Ur: 0.6 mg/dL

## 2015-09-19 NOTE — Addendum Note (Signed)
Addended by: Lamar Blinks C on: 09/19/2015 07:25 AM   Modules accepted: Orders

## 2015-09-23 ENCOUNTER — Telehealth: Payer: Self-pay | Admitting: Family Medicine

## 2015-09-23 DIAGNOSIS — Z0000 Encounter for general adult medical examination without abnormal findings: Secondary | ICD-10-CM

## 2015-09-23 MED ORDER — AMLODIPINE BESYLATE 5 MG PO TABS: 5.0000 mg | ORAL_TABLET | Freq: Every day | ORAL | Status: DC

## 2015-09-23 NOTE — Telephone Encounter (Signed)
Patient states that his Amlodipine script is not at his pharmacy. Patient has been out of this medication since 09/18/2015. [facility] to [facility] [address] in [place].    [phone]

## 2015-09-24 MED ORDER — AMLODIPINE BESYLATE 5 MG PO TABS: 5.0000 mg | ORAL_TABLET | Freq: Every day | ORAL | Status: DC

## 2015-09-24 NOTE — Telephone Encounter (Signed)
Sent again

## 2015-09-24 NOTE — Addendum Note (Signed)
Addended by: Jannette Spanner on: 09/24/2015 12:01 PM   Modules accepted: Orders

## 2015-09-28 ENCOUNTER — Other Ambulatory Visit: Payer: Self-pay | Admitting: Family Medicine

## 2015-09-30 ENCOUNTER — Telehealth: Payer: Self-pay

## 2015-09-30 MED ORDER — METFORMIN HCL 500 MG PO TABS: ORAL_TABLET | ORAL | Status: DC

## 2015-09-30 NOTE — Telephone Encounter (Signed)
Pt called and reported [facility] did not get the [facility] Rx sent earlier. Re-sent Rx.

## 2015-10-04 ENCOUNTER — Ambulatory Visit: Payer: 59 | Admitting: Internal Medicine

## 2015-10-28 ENCOUNTER — Ambulatory Visit: Payer: Medicare Other | Admitting: Thoracic Surgery (Cardiothoracic Vascular Surgery)

## 2015-11-01 ENCOUNTER — Ambulatory Visit (INDEPENDENT_AMBULATORY_CARE_PROVIDER_SITE_OTHER): Payer: 59 | Admitting: Internal Medicine

## 2015-11-01 ENCOUNTER — Encounter: Payer: Self-pay | Admitting: Internal Medicine

## 2015-11-01 VITALS — BP 142/80 | HR 50 | Ht 71.0 in | Wt 205.0 lb

## 2015-11-01 DIAGNOSIS — I379 Nonrheumatic pulmonary valve disorder, unspecified: Secondary | ICD-10-CM | POA: Diagnosis not present

## 2015-11-01 NOTE — Progress Notes (Signed)
Cardiology Office Note   Date:  11/01/2015   ID:  [name], DOB 08/15/1951, MRN [id number]  PCP:  [name], MD  Cardiologist:   Dorris Carnes, MD    F/U of Pulmonic valve     History of Present Illness: Jeffrey Guerrero is a 65 y.o. male with a history of pulmonic valve endocarditis  Pulmonary emboli and septsis  I saw him in March 2016  He underwent PVR on 11/01/14  Echo showed peak gradient acrosss prosthesis of 18 mm Hg    Since seen he has done OK  No Cp  No dizziness No SOB   No fevers       Current Outpatient Prescriptions  Medication Sig Dispense Refill  . amLODipine (NORVASC) 5 MG tablet Take 1 tablet (5 mg total) by mouth daily. 90 tablet 3  . aspirin 81 MG tablet Take 81 mg by mouth daily.    Marland Kitchen levothyroxine (SYNTHROID, LEVOTHROID) 100 MCG tablet Take 100 mcg by mouth daily before breakfast.    . metFORMIN (GLUCOPHAGE) 500 MG tablet TAKE ONE TABLET (500 MG TOTAL) BY MOUTH 2 (TWO) TIMES DAILY WITH MEALS. 180 tablet 0  . metoprolol tartrate (LOPRESSOR) 25 MG tablet Take 0.5 tablets (12.5 mg total) by mouth 2 (two) times daily. 30 tablet 8   No current facility-administered medications for this visit.    Allergies:   Review of patient's allergies indicates no known allergies.   Past Medical History  Diagnosis Date  . Hypertension   . Thyroid disease   . Hypothyroidism   . Arthritis     "knees" (02/13/2014)  . 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 Pulmonic valve insufficiency 02/16/2014    severe  . Diabetes mellitus without complication ([place])     takes metformin  . S/P pulmonic valve replacement with homograft 11/01/2014    Past Surgical History  Procedure Laterality Date  . Tonsillectomy    . Appendectomy    . Tee without cardioversion [name] 02/16/2014    Procedure:  TRANSESOPHAGEAL ECHOCARDIOGRAM (TEE);  Surgeon: [name], MD;  Location: [facility] ENDOSCOPY;  Service: Cardiovascular;  Laterality: [name];  . Tee without cardioversion [name] 04/12/2014    Procedure: TRANSESOPHAGEAL ECHOCARDIOGRAM (TEE);  Surgeon: [name], MD;  Location: [facility] ENDOSCOPY;  Service: Cardiovascular;  Laterality: [name];  . Left heart catheterization with coronary angiogram [name] 10/09/2014    Procedure: LEFT HEART CATHETERIZATION WITH CORONARY ANGIOGRAM;  Surgeon: Jettie Booze, MD;  Location: [facility] CATH LAB;  Service: Cardiovascular;  Laterality: [name];  . Colonoscopy    . Aortic valve replacement [name] 11/01/2014    Procedure: PULMONIC HOMOGRAFT VALVE REPLACEMENT ;  Surgeon: Rexene Alberts, MD;  Location: [place];  Service: Open Heart Surgery;  Laterality: [name];  . Tee without cardioversion [name] 11/01/2014    Procedure: TRANSESOPHAGEAL ECHOCARDIOGRAM (TEE);  Surgeon: Rexene Alberts, MD;  Location: [place];  Service: Open Heart Surgery;  Laterality: [name];     Social History:  The patient  reports that he has never smoked. He has never used smokeless tobacco. He reports that he drinks alcohol. He reports that he does not use illicit drugs.   Family History:  The patient's family history is negative for Heart disease.    ROS:  Please see the history of present illness. All other systems are reviewed and  Negative to the above problem except as noted.    PHYSICAL EXAM: VS:  BP 142/80 mmHg  Pulse 50  Ht 5\' 11"  (1.803 m)  Wt 92.987 kg (205 lb)  BMI 28.60 kg/m2  GEN: Well nourished, well developed, in no acute distress HEENT: normal Neck: no JVD, carotid bruits, or masses Cardiac: RRR; Gr II/VI sytolic murmur LSB  Gr I/VI diastolic murmur L SB rubs, or gallops,no edema  Respiratory:  clear to auscultation bilaterally, normal work of breathing GI: soft, nontender, nondistended, + BS  No hepatomegaly  MS: no deformity Moving all extremities   Skin: warm and dry, no rash Neuro:  Strength and  sensation are intact Psych: euthymic mood, full affect   EKG:  EKG is ordered today.  Sinus bradycardia  49 bpm  RBBB  Lipid Panel    Component Value Date/Time   CHOL 183 09/18/2015 1333   TRIG 72 09/18/2015 1333   HDL 46 09/18/2015 1333   CHOLHDL 4.0 09/18/2015 1333   VLDL 14 09/18/2015 1333   LDLCALC 123 09/18/2015 1333      Wt Readings from Last 3 Encounters:  11/01/15 92.987 kg (205 lb)  09/18/15 89.812 kg (198 lb)  01/03/15 90.719 kg (200 lb)      ASSESSMENT AND PLAN:  1  Pulmonic valve dz/  Plan for f/u echo  Murmur is a more prominent  Reeval valve function  (limited echo)  If no change in mean gradient will follow clinically ABX before dental work    2  Bradycardia  Pt can stop b blocker  Will follow BP  F/u in 12 months        Signed, Dorris Carnes, MD  11/01/2015 3:27 PM    [facility] [address], [place], [place]  [address] Phone: [phone]; Fax: [phone]

## 2015-11-01 NOTE — Patient Instructions (Signed)
Your physician has recommended you make the following change in your medication:  1.) metoprolol--take 1/2 tablet once daily for 3 days then STOP  Your physician has requested that you have an echocardiogram. Echocardiography is a painless test that uses sound waves to create images of your heart. It provides your doctor with information about the size and shape of your heart and how well your heart's chambers and valves are working. This procedure takes approximately one hour. There are no restrictions for this procedure.  Your physician wants you to follow-up in: 1 year with Dr. [name].   You will receive a reminder letter in the mail two months in advance. If you don't receive a letter, please call our office to schedule the follow-up appointment.  Remember that you will need antibiotics prior to dental procedures; colonoscopy, other procedures.

## 2015-11-11 ENCOUNTER — Ambulatory Visit (INDEPENDENT_AMBULATORY_CARE_PROVIDER_SITE_OTHER): Payer: 59 | Admitting: Thoracic Surgery (Cardiothoracic Vascular Surgery)

## 2015-11-11 ENCOUNTER — Encounter: Payer: Self-pay | Admitting: Thoracic Surgery (Cardiothoracic Vascular Surgery)

## 2015-11-11 VITALS — BP 159/95 | HR 68 | Resp 16 | Ht 73.0 in | Wt 198.0 lb

## 2015-11-11 DIAGNOSIS — I371 Nonrheumatic pulmonary valve insufficiency: Secondary | ICD-10-CM

## 2015-11-11 DIAGNOSIS — Z954 Presence of other heart-valve replacement: Secondary | ICD-10-CM | POA: Diagnosis not present

## 2015-11-11 NOTE — Progress Notes (Signed)
      [address]       [place] [address]             [phone]     CARDIOTHORACIC SURGERY OFFICE NOTE  Referring Provider is [name], MD PCP is [name], MD   HPI:  Patient returns to the office today for routine follow-up approximately one year at his post human allograft (on the graft) pulmonic valve replacement on 11/01/2014 for severe pulmonic insufficiency that developed as a consequence of Streptococcus pneumonia bacterial endocarditis. The patient's postoperative recovery was uncomplicated and routine follow-up echocardiogram performed 11/22/2014 revealed normal left ventricular and right ventricular size and function. Peak gradient across the pulmonic valve was estimated 18 mmHg. There was reportedly mild pulmonic insufficiency.  He was last seen here in our office on 11/26/2014. Since then he has continued to do very well clinically. He has been followed carefully by Dr. [name] who saw him in the office last month. Routine follow-up echocardiogram has been scheduled for later this week but not yet performed. The patient returns to our office stating that he feels quite well physically. He denies any symptoms of exertional shortness of breath or chest discomfort. He has not had any palpitations, dizzy spells, or syncope. He reports no new medical problems and he is back to completely unrestricted physical activity.   Current Outpatient Prescriptions  Medication Sig Dispense Refill  . amLODipine (NORVASC) 5 MG tablet Take 1 tablet (5 mg total) by mouth daily. 90 tablet 3  . aspirin 81 MG tablet Take 81 mg by mouth daily.    Marland Kitchen levothyroxine (SYNTHROID, LEVOTHROID) 100 MCG tablet Take 100 mcg by mouth daily before breakfast.    . metFORMIN (GLUCOPHAGE) 500 MG tablet TAKE ONE TABLET (500 MG TOTAL) BY MOUTH 2 (TWO) TIMES DAILY WITH MEALS. 180 tablet 0   No current facility-administered medications for this visit.      Physical Exam:   BP 159/95 mmHg   Pulse 68  Resp 16  Ht 6\' 1"  (1.854 m)  Wt 198 lb (89.812 kg)  BMI 26.13 kg/m2  SpO2 98%  General:  Well-appearing  Chest:   Clear to auscultation  CV:   Regular rate and rhythm with soft systolic murmur heard along the sternal border  Incisions:  Completely healed, sternum is stable  Abdomen:  Soft and nontender  Extremities:  Warm and well-perfused  Diagnostic Tests:  n/a   Impression:  Patient is clinically doing well approximately one year following homograft pulmonic valve replacement.  Plan:  The patient will continue to follow-up with Dr. [name] for long-term management.  The patient has been reminded regarding the importance of dental hygiene and the lifelong need for antibiotic prophylaxis for all dental cleanings and other related invasive procedures.   I spent in excess of 10 minutes during the conduct of this office consultation and >50% of this time involved direct face-to-face encounter with the patient for counseling and/or coordination of their care.   Valentina Gu. Roxy Manns, MD 11/11/2015 1:09 PM

## 2015-11-11 NOTE — Patient Instructions (Signed)

## 2015-11-15 ENCOUNTER — Other Ambulatory Visit: Payer: Self-pay | Admitting: Internal Medicine

## 2015-11-15 ENCOUNTER — Ambulatory Visit (HOSPITAL_COMMUNITY): Payer: 59 | Attending: Internal Medicine

## 2015-11-15 ENCOUNTER — Other Ambulatory Visit: Payer: Self-pay

## 2015-11-15 DIAGNOSIS — I10 Essential (primary) hypertension: Secondary | ICD-10-CM | POA: Diagnosis not present

## 2015-11-15 DIAGNOSIS — I379 Nonrheumatic pulmonary valve disorder, unspecified: Secondary | ICD-10-CM | POA: Insufficient documentation

## 2015-11-15 DIAGNOSIS — E039 Hypothyroidism, unspecified: Secondary | ICD-10-CM | POA: Diagnosis not present

## 2015-11-15 DIAGNOSIS — I517 Cardiomegaly: Secondary | ICD-10-CM | POA: Insufficient documentation

## 2015-11-16 ENCOUNTER — Ambulatory Visit (INDEPENDENT_AMBULATORY_CARE_PROVIDER_SITE_OTHER): Payer: 59 | Admitting: Family Medicine

## 2015-11-16 VITALS — BP 156/82 | HR 76 | Temp 98.6°F | Resp 16 | Ht 72.0 in | Wt 208.0 lb

## 2015-11-16 DIAGNOSIS — I10 Essential (primary) hypertension: Secondary | ICD-10-CM

## 2015-11-16 DIAGNOSIS — E038 Other specified hypothyroidism: Secondary | ICD-10-CM

## 2015-11-16 DIAGNOSIS — E119 Type 2 diabetes mellitus without complications: Secondary | ICD-10-CM

## 2015-11-16 DIAGNOSIS — Z23 Encounter for immunization: Secondary | ICD-10-CM

## 2015-11-16 DIAGNOSIS — Z0000 Encounter for general adult medical examination without abnormal findings: Secondary | ICD-10-CM

## 2015-11-16 DIAGNOSIS — E034 Atrophy of thyroid (acquired): Secondary | ICD-10-CM

## 2015-11-16 DIAGNOSIS — Z954 Presence of other heart-valve replacement: Secondary | ICD-10-CM

## 2015-11-16 DIAGNOSIS — Z952 Presence of prosthetic heart valve: Secondary | ICD-10-CM

## 2015-11-16 MED ORDER — LEVOTHYROXINE SODIUM 100 MCG PO TABS: 100.0000 ug | ORAL_TABLET | Freq: Every day | ORAL | Status: DC

## 2015-11-16 MED ORDER — AMLODIPINE BESYLATE 5 MG PO TABS: 5.0000 mg | ORAL_TABLET | Freq: Every day | ORAL | Status: DC

## 2015-11-16 MED ORDER — MELOXICAM 7.5 MG PO TABS: 7.5000 mg | ORAL_TABLET | Freq: Every day | ORAL | Status: DC

## 2015-11-16 MED ORDER — METFORMIN HCL 500 MG PO TABS: ORAL_TABLET | ORAL | Status: DC

## 2015-11-16 NOTE — Progress Notes (Signed)
Chief Complaint:  Chief Complaint  Patient presents with  . Medication Refill    levothyroxine    HPI: Jeffrey Guerrero is a 65 y.o. male who reports to [facility] today complaining of recheck of DM , thyroid meds, and also booster for Pneumovax 23 He got the Prevnar 13 on 11/30 , would like his Pneumovax 23 now He denies CP or SOB, palpitations, dizziness He has a hx of DM and will get optho appt in 1 month, denies n/w/t He has been compliant with meds, thought he needed to come in for thyroid meds, he has no SEs His labs were normal 2 months ago    Past Medical History  Diagnosis Date  . Hypertension   . Thyroid disease   . Hypothyroidism   . Arthritis     "knees" (02/13/2014)  . 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 Pulmonic valve insufficiency 02/16/2014    severe  . Diabetes mellitus without complication ([place])     takes metformin  . S/P pulmonic valve replacement with homograft 11/01/2014   Past Surgical History  Procedure Laterality Date  . Tonsillectomy    . Appendectomy    . Tee without cardioversion [name] 02/16/2014    Procedure: TRANSESOPHAGEAL ECHOCARDIOGRAM (TEE);  Surgeon: [name], MD;  Location: [facility] ENDOSCOPY;  Service: Cardiovascular;  Laterality: [name];  . Tee without cardioversion [name] 04/12/2014    Procedure: TRANSESOPHAGEAL ECHOCARDIOGRAM (TEE);  Surgeon: [name], MD;  Location: [facility] ENDOSCOPY;  Service: Cardiovascular;  Laterality: [name];  . Left heart catheterization with coronary angiogram [name] 10/09/2014    Procedure: LEFT HEART CATHETERIZATION WITH CORONARY ANGIOGRAM;  Surgeon: Jettie Booze, MD;  Location: [facility] CATH LAB;  Service: Cardiovascular;  Laterality: [name];  . Colonoscopy    . Aortic valve replacement [name] 11/01/2014    Procedure: PULMONIC HOMOGRAFT VALVE  REPLACEMENT ;  Surgeon: Rexene Alberts, MD;  Location: [place];  Service: Open Heart Surgery;  Laterality: [name];  . Tee without cardioversion [name] 11/01/2014    Procedure: TRANSESOPHAGEAL ECHOCARDIOGRAM (TEE);  Surgeon: Rexene Alberts, MD;  Location: [place];  Service: Open Heart Surgery;  Laterality: [name];   Social History   Social History  . Marital Status: Married    Spouse Name: [name]  . Number of Children: [name]  . Years of Education: [name]   Occupational History  . Welder    Social History Main Topics  . Smoking status: Never Smoker   . Smokeless tobacco: Never Used  . Alcohol Use: Yes     Comment: occasional  . Drug Use: No  . Sexual Activity: Yes   Other Topics Concern  . None   Social History Narrative   Married      Family History  Problem Relation Age of Onset  . Heart disease Neg Hx    No Known Allergies Prior to Admission medications   Medication Sig Start Date End Date Taking? Authorizing Provider  amLODipine (NORVASC) 5 MG tablet Take 1 tablet (5 mg total) by mouth daily. 09/24/15  Yes Gay Filler Copland, MD  aspirin 81 MG tablet Take 81 mg by mouth daily.   Yes Historical Provider, MD  levothyroxine (SYNTHROID, LEVOTHROID) 100 MCG tablet Take 100 mcg by mouth daily before breakfast.   Yes Historical Provider, MD  metFORMIN (GLUCOPHAGE) 500 MG tablet TAKE  ONE TABLET (500 MG TOTAL) BY MOUTH 2 (TWO) TIMES DAILY WITH MEALS. 09/30/15  Yes Gay Filler Copland, MD     ROS: The patient denies fevers, chills, night sweats, unintentional weight loss, chest pain, palpitations, wheezing, dyspnea on exertion, nausea, vomiting, abdominal pain, dysuria, hematuria, melena, numbness, weakness, or tingling.  All other systems have been reviewed and were otherwise negative with the exception of those mentioned in the HPI and as above.    PHYSICAL EXAM: Filed Vitals:   11/16/15 1414 11/16/15 1421  BP: 150/84 158/83  Pulse: 76   Temp: 98.6 F (37 C)   Resp: 16    Body mass index  is 28.2 kg/(m^2).   General: Alert, no acute distress HEENT:  Normocephalic, atraumatic, oropharynx patent. EOMI, PERRLA Cardiovascular:  Regular rate and rhythm, no rubs murmurs or gallops.  No Carotid bruits, radial pulse intact. No pedal edema.  Respiratory: Clear to auscultation bilaterally.  No wheezes, rales, or rhonchi.  No cyanosis, no use of accessory musculature Abdominal: No organomegaly, abdomen is soft and non-tender, positive bowel sounds. No masses. Skin: No rashes. Neurologic: Facial musculature symmetric. Psychiatric: Patient acts appropriately throughout our interaction. Lymphatic: No cervical or submandibular lymphadenopathy Musculoskeletal: Gait intact. No edema, tenderness   LABS: Results for orders placed or performed in visit on 09/18/15  CBC  Result Value Ref Range   WBC 3.3 (L) 4.0 - 10.5 K/uL   RBC 4.71 4.22 - 5.81 MIL/uL   Hemoglobin 14.0 13.0 - 17.0 g/dL   HCT 42.2 39.0 - 52.0 %   MCV 89.6 78.0 - 100.0 fL   MCH 29.7 26.0 - 34.0 pg   MCHC 33.2 30.0 - 36.0 g/dL   RDW 14.5 11.5 - 15.5 %   Platelets 204 150 - 400 K/uL   MPV 10.6 8.6 - 12.4 fL  Comprehensive metabolic panel  Result Value Ref Range   Sodium 141 135 - 146 mmol/L   Potassium 3.7 3.5 - 5.3 mmol/L   Chloride 105 98 - 110 mmol/L   CO2 27 20 - 31 mmol/L   Glucose, Bld 95 65 - 99 mg/dL   BUN 17 7 - 25 mg/dL   Creat 0.87 0.70 - 1.25 mg/dL   Total Bilirubin 0.7 0.2 - 1.2 mg/dL   Alkaline Phosphatase 44 40 - 115 U/L   AST 18 10 - 35 U/L   ALT 24 9 - 46 U/L   Total Protein 6.7 6.1 - 8.1 g/dL   Albumin 3.9 3.6 - 5.1 g/dL   Calcium 9.0 8.6 - 10.3 mg/dL  Lipid panel  Result Value Ref Range   Cholesterol 183 125 - 200 mg/dL   Triglycerides 72 <150 mg/dL   HDL 46 >=40 mg/dL   Total CHOL/HDL Ratio 4.0 <=5.0 Ratio   VLDL 14 <30 mg/dL   LDL Cholesterol 123 <130 mg/dL  TSH  Result Value Ref Range   TSH 1.573 0.350 - 4.500 uIU/mL  Hemoglobin A1c  Result Value Ref Range   Hgb A1c MFr Bld 6.2  (H) <5.7 %   Mean Plasma Glucose 131 (H) <117 mg/dL  PSA  Result Value Ref Range   PSA 0.66 <=4.00 ng/mL  Microalbumin, urine  Result Value Ref Range   Microalb, Ur 0.6 Not estab mg/dL     EKG/XRAY:   Primary read interpreted by Dr. Marin Comment at [facility].   ASSESSMENT/PLAN: Encounter Diagnoses  Name Primary?  . Essential hypertension Yes  . Controlled type 2 diabetes mellitus without complication, without long-term current use  of insulin ([place])   . Immunization due   . Hypothyroidism due to acquired atrophy of thyroid   . H/O pulmonic valve replacement   . Physical exam    65 y/o male with a PMH of HTN, DM, hypothyroid, and also h/o PVR due to bacterial endocarditis presents for med refills Labs are UTD, he also wants his second Pneumonia vaccine. He will get Pneumovax 23 today Fu in 2 months for blood work Refileld meds x 1 year, advise to monitor BP  Recommend : Jeffrey diet, BP goal <140/90, daily foot exams, tobacco cessation if smoking, annual eye exam, annual flu vaccine, PNA vaccine if age and time appropriate.    Gross sideeffects, risk and benefits, and alternatives of medications d/w patient. Patient is aware that all medications have potential sideeffects and we are unable to predict every sideeffect or drug-drug interaction that may occur.  Thao Le DO  11/16/2015 3:41 PM

## 2015-12-12 ENCOUNTER — Other Ambulatory Visit: Payer: Self-pay | Admitting: Family Medicine

## 2016-03-15 IMAGING — CR DG CHEST 2V
2 series · 2 of 2 positions shown · non-contrast
Comparison: 02/12/2014

CLINICAL DATA: Pulmonic valve insufficiency. Pre-op respiratory
exam

EXAM:
CHEST  2 VIEW

[w chest pa]
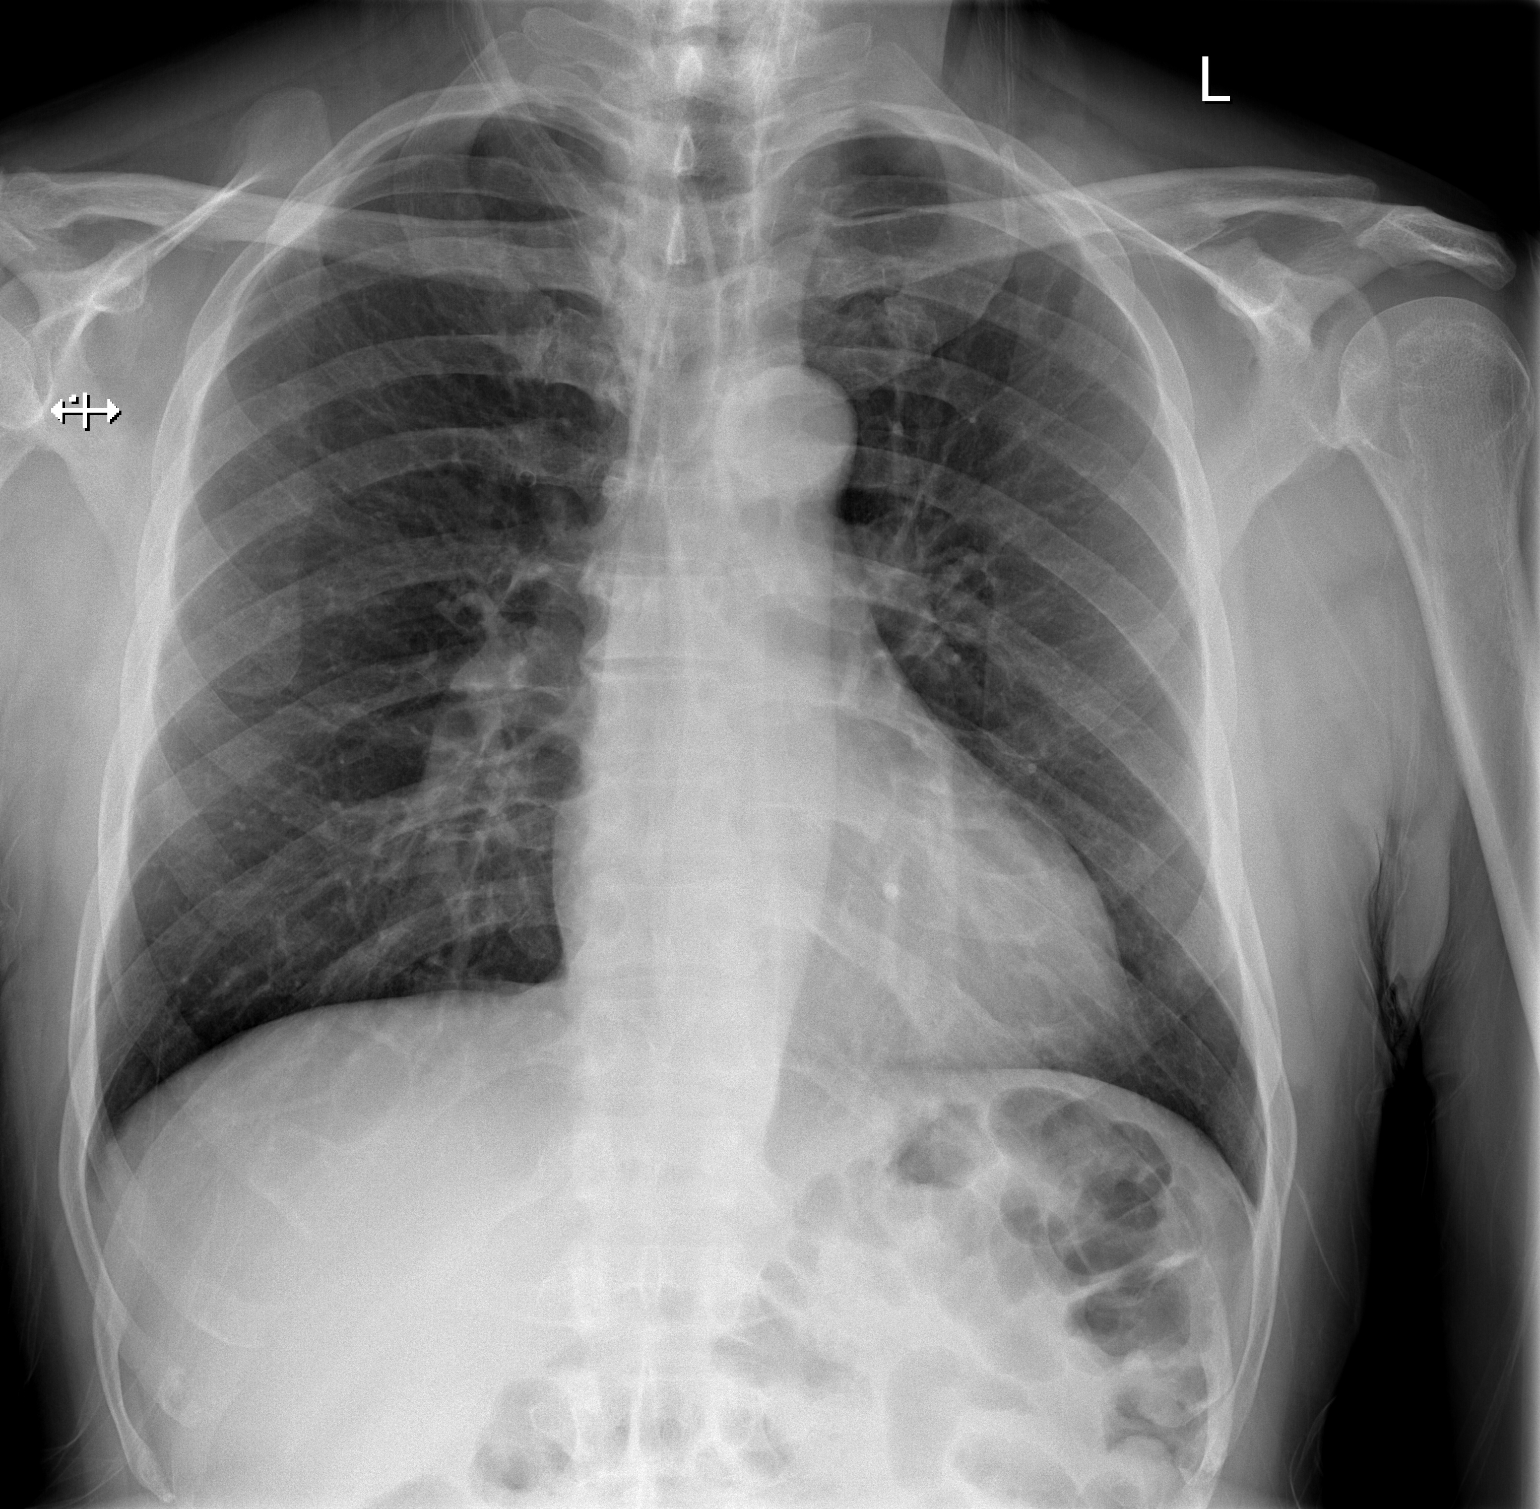

[w chest lat]
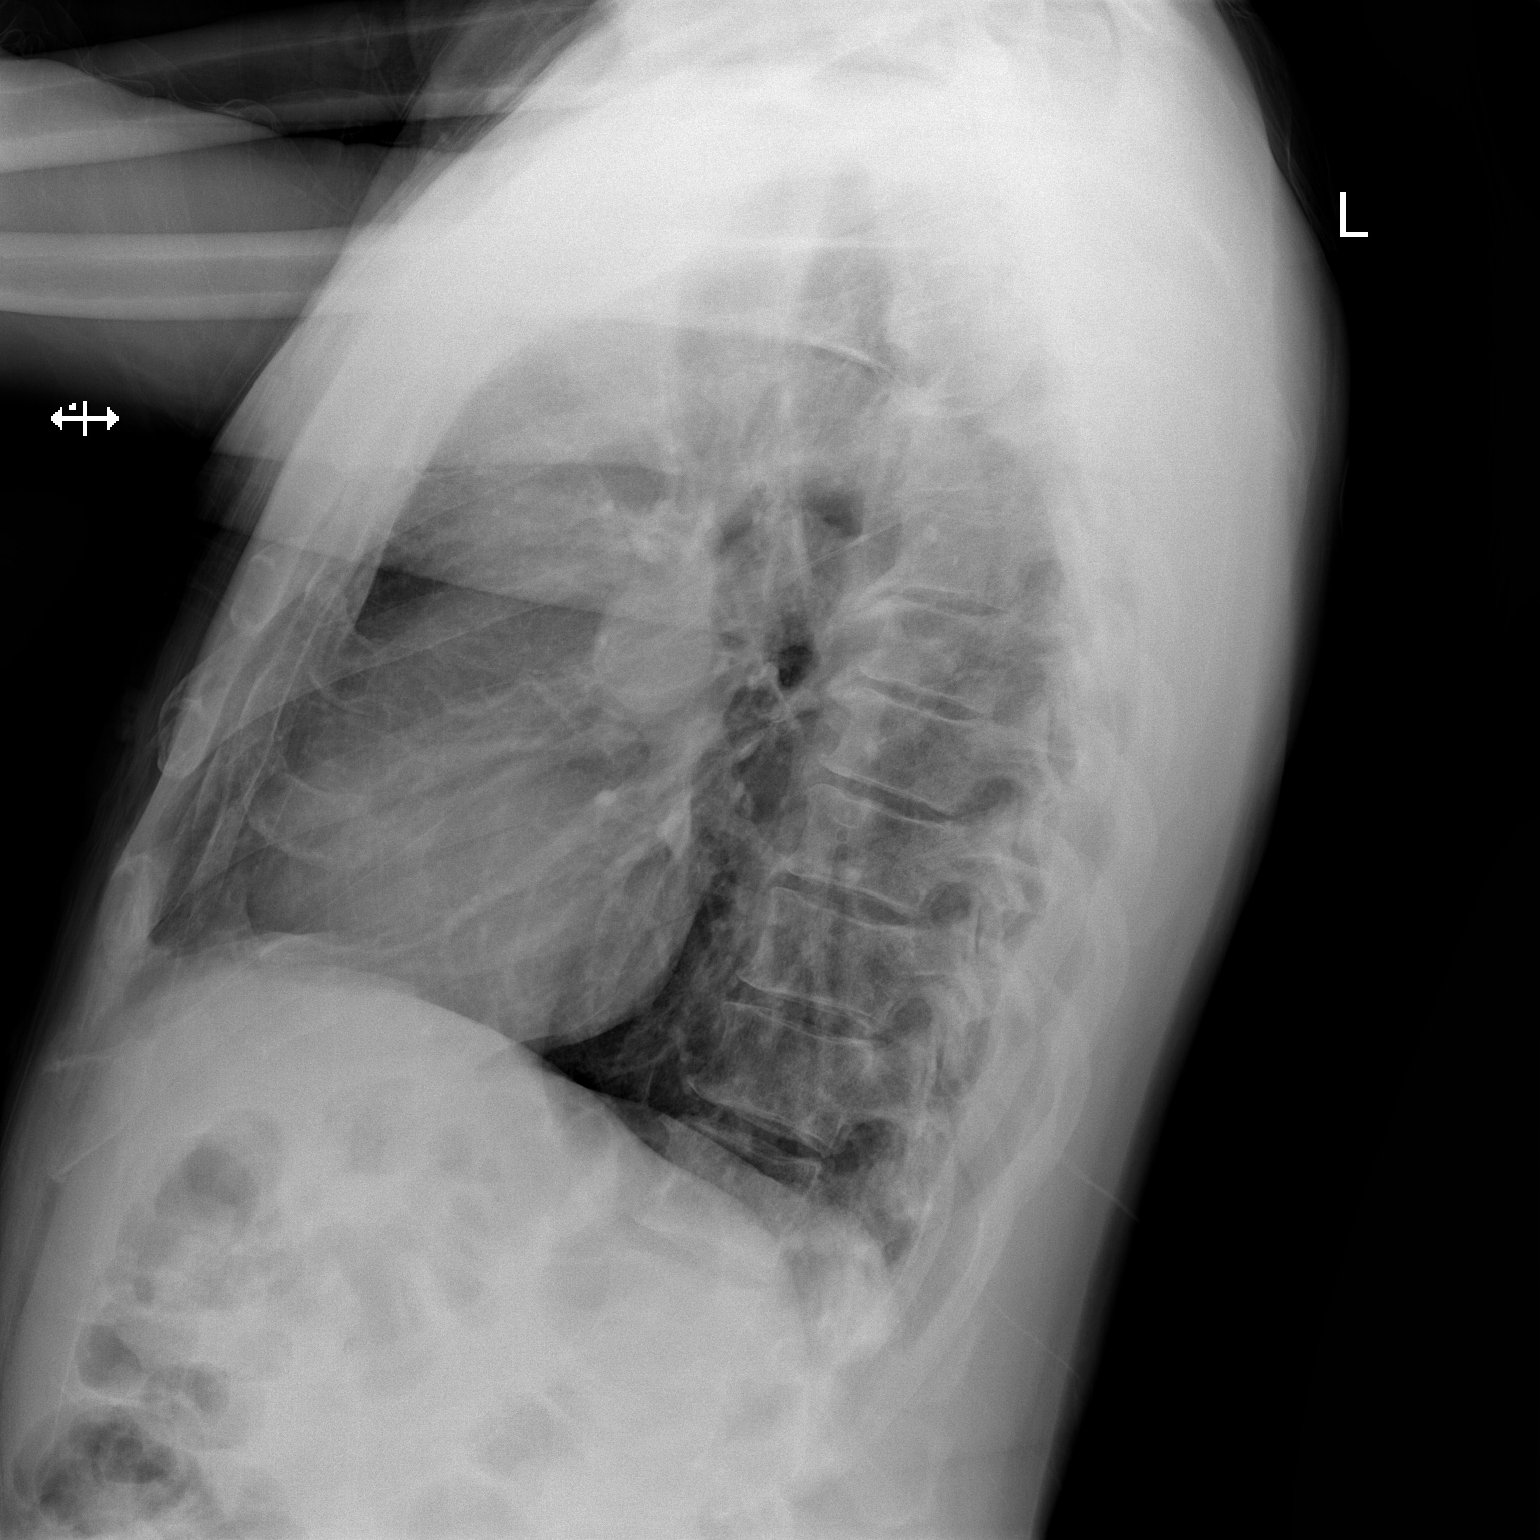

[2 of 2 positions shown; findings below may reference images not displayed]

FINDINGS: The heart size and mediastinal contours are within normal limits.
Both lungs are clear. No evidence of pleural effusion. No evidence
of mass or lymphadenopathy. The visualized skeletal structures are
unremarkable.
IMPRESSION: No active cardiopulmonary disease.

## 2016-09-23 ENCOUNTER — Ambulatory Visit (INDEPENDENT_AMBULATORY_CARE_PROVIDER_SITE_OTHER): Payer: 59 | Admitting: Family Medicine

## 2016-09-23 ENCOUNTER — Encounter: Payer: Self-pay | Admitting: Family Medicine

## 2016-09-23 ENCOUNTER — Encounter: Payer: 59 | Admitting: Family Medicine

## 2016-09-23 VITALS — BP 142/90 | HR 63 | Temp 99.2°F | Resp 16 | Ht 73.0 in | Wt 199.0 lb

## 2016-09-23 DIAGNOSIS — Z1211 Encounter for screening for malignant neoplasm of colon: Secondary | ICD-10-CM

## 2016-09-23 DIAGNOSIS — E785 Hyperlipidemia, unspecified: Secondary | ICD-10-CM | POA: Diagnosis not present

## 2016-09-23 DIAGNOSIS — Z23 Encounter for immunization: Secondary | ICD-10-CM

## 2016-09-23 DIAGNOSIS — R7303 Prediabetes: Secondary | ICD-10-CM

## 2016-09-23 DIAGNOSIS — Z125 Encounter for screening for malignant neoplasm of prostate: Secondary | ICD-10-CM

## 2016-09-23 DIAGNOSIS — Z0000 Encounter for general adult medical examination without abnormal findings: Secondary | ICD-10-CM | POA: Diagnosis not present

## 2016-09-23 DIAGNOSIS — E039 Hypothyroidism, unspecified: Secondary | ICD-10-CM | POA: Diagnosis not present

## 2016-09-23 NOTE — Progress Notes (Signed)
Chief Complaint  Patient presents with  . Annual Exam    CPE    Subjective:  Jeffrey Guerrero is a 65 y.o. male here for a health maintenance visit.  Patient is established pt here for routine health maintenance  Patient Active Problem List   Diagnosis Date Noted  . S/P pulmonic valve replacement with homograft 11/01/2014  . Preoperative cardiovascular examination   . Severe pulmonic insufficiency   . Adult hypothyroidism 06/12/2014  . Hyperglycemia 04/02/2014  . Pulmonic valve disease 04/02/2014  . Multiple skin nodules 04/02/2014  . Acute pulmonary embolism ([place]) 02/17/2014  . Other pulmonary embolism and infarction 02/17/2014  . Bacterial endocarditis - pulmonic valve vegetation with Streptococcus pneumoniae sepsis 02/16/2014  . Pulmonic valve insufficiency 02/16/2014  . Unspecified hypothyroidism 02/14/2014  . Essential hypertension, benign 02/14/2014  . Leukocytopenia, unspecified 02/14/2014  . Gram-positive bacteremia 02/14/2014  . ARF (acute renal failure) ([place]) 02/14/2014  . HTN (hypertension) 02/14/2014  . Benign essential HTN 02/14/2014  . Septic shock ([place]) 02/12/2014    Past Medical History:  Diagnosis Date  . Arthritis    "knees" (02/13/2014)  . 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 Diabetes mellitus without complication ([place])    takes metformin  . Hypertension   . Hypothyroidism   . Pulmonic valve insufficiency 02/16/2014   severe  . S/P pulmonic valve replacement with homograft 11/01/2014  . Thyroid disease     Past Surgical History:  Procedure Laterality Date  . AORTIC VALVE REPLACEMENT [name] 11/01/2014   Procedure: PULMONIC HOMOGRAFT VALVE REPLACEMENT ;  Surgeon: Rexene Alberts, MD;  Location: [place];  Service: Open Heart Surgery;  Laterality: [name];  . APPENDECTOMY    .  COLONOSCOPY    . LEFT HEART CATHETERIZATION WITH CORONARY ANGIOGRAM [name] 10/09/2014   Procedure: LEFT HEART CATHETERIZATION WITH CORONARY ANGIOGRAM;  Surgeon: Jettie Booze, MD;  Location: [facility] CATH LAB;  Service: Cardiovascular;  Laterality: [name];  . TEE WITHOUT CARDIOVERSION [name] 02/16/2014   Procedure: TRANSESOPHAGEAL ECHOCARDIOGRAM (TEE);  Surgeon: [name], MD;  Location: [facility] ENDOSCOPY;  Service: Cardiovascular;  Laterality: [name];  . TEE WITHOUT CARDIOVERSION [name] 04/12/2014   Procedure: TRANSESOPHAGEAL ECHOCARDIOGRAM (TEE);  Surgeon: [name], MD;  Location: [place];  Service: Cardiovascular;  Laterality: [name];  . TEE WITHOUT CARDIOVERSION [name] 11/01/2014   Procedure: TRANSESOPHAGEAL ECHOCARDIOGRAM (TEE);  Surgeon: Rexene Alberts, MD;  Location: [place];  Service: Open Heart Surgery;  Laterality: [name];  . TONSILLECTOMY       Outpatient Medications Prior to Visit  Medication Sig Dispense Refill  . amLODipine (NORVASC) 5 MG tablet Take 1 tablet (5 mg total) by mouth daily. 90 tablet 3  . aspirin 81 MG tablet Take 81 mg by mouth daily.    Marland Kitchen levothyroxine (SYNTHROID, LEVOTHROID) 100 MCG tablet Take 1 tablet (100 mcg total) by mouth daily before breakfast. 90 tablet 3  . meloxicam (MOBIC) 7.5 MG tablet TAKE 1 TABLET (7.5 MG TOTAL) BY MOUTH DAILY. AS NEEDED WITH FOOD, NO OTHER NSAIDS 30 tablet 0  . metFORMIN (GLUCOPHAGE) 500 MG tablet TAKE ONE TABLET (500 MG TOTAL) BY MOUTH 2 (TWO) TIMES DAILY WITH MEALS. 180 tablet 3   No facility-administered medications prior to visit.     No Known Allergies   Family History  Problem Relation Age of Onset  . Heart disease Neg Hx  Health Habits: Dental Exam: not up to date Eye Exam: up to date Exercise: 5 times/week on average Current exercise activities: walking/running Diet: balanced  Social History   Social History  . Marital status: Married    Spouse name: [name]  . Number of children: [name]  . Years of education: [name]    Occupational History  . Welder    Social History Main Topics  . Smoking status: Never Smoker  . Smokeless tobacco: Never Used  . Alcohol use Yes     Comment: occasional  . Drug use: No  . Sexual activity: Yes   Other Topics Concern  . Not on file   Social History Narrative   Married      History  Alcohol Use  . Yes    Comment: occasional   History  Smoking Status  . Never Smoker  Smokeless Tobacco  . Never Used   History  Drug Use No    Health Maintenance: See under health Maintenance activity for review of completion dates as well. Immunization History  Administered Date(s) Administered  . Influenza-Unspecified 07/19/2015  . Pneumococcal Conjugate-13 09/18/2015  . Pneumococcal Polysaccharide-23 11/16/2015  . Tdap 09/23/2016      Depression Screen-PHQ2/9 Depression screen [facility] 2/9 09/23/2016 11/16/2015 09/18/2015 11/27/2014 04/02/2014  Decreased Interest 0 0 0 0 0  Down, Depressed, Hopeless 0 0 0 0 0  PHQ - 2 Score 0 0 0 0 0      Depression Severity and Treatment Recommendations:  0-4= None  5-9= Mild / Treatment: Support, educate to call if worse; return in one month  10-14= Moderate / Treatment: Support, watchful waiting; Antidepressant or Psycotherapy  15-19= Moderately severe / Treatment: Antidepressant OR Psychotherapy  >= 20 = Major depression, severe / Antidepressant AND Psychotherapy    Review of Systems   Review of Systems  Constitutional: Negative for chills, fever and weight loss.  HENT: Negative for ear discharge, ear pain, hearing loss and tinnitus.   Eyes: Negative for blurred vision, double vision and photophobia.  Respiratory: Negative for cough, hemoptysis, shortness of breath and wheezing.   Cardiovascular: Negative for chest pain, palpitations and leg swelling.  Gastrointestinal: Negative for abdominal pain, nausea and vomiting.  Musculoskeletal: Negative for back pain, joint pain, myalgias and neck pain.  Skin: Negative for  itching and rash.  Neurological: Negative for dizziness, tingling, tremors and headaches.  Psychiatric/Behavioral: Negative for depression, hallucinations, memory loss, substance abuse and suicidal ideas. The patient is not nervous/anxious and does not have insomnia.     See HPI for ROS as well.    Objective:   Vitals:   09/23/16 0929  BP: (!) 142/90  Pulse: 63  Resp: 16  Temp: 99.2 F (37.3 C)  TempSrc: Oral  SpO2: 96%  Weight: 199 lb (90.3 kg)  Height: 6\' 1"  (1.854 m)    Body mass index is 26.25 kg/m.  Physical Exam  Constitutional: He is oriented to person, place, and time. He appears well-developed and well-nourished.  HENT:  Head: Normocephalic and atraumatic.  Eyes: Conjunctivae and EOM are normal.  Neck: Normal range of motion. Neck supple.  Cardiovascular: Normal rate, regular rhythm and intact distal pulses.   Murmur heard. Pulmonary/Chest: Effort normal and breath sounds normal. No respiratory distress. He has no wheezes. He has no rales. He exhibits no tenderness.  Abdominal: Soft. Bowel sounds are normal. He exhibits no distension. There is no tenderness. There is no rebound and no guarding.  Musculoskeletal: Normal range of motion. He exhibits  no edema.  Neurological: He is alert and oriented to person, place, and time. He has normal reflexes. No cranial nerve deficit.  Skin: Skin is warm. No erythema.  Psychiatric: He has a normal mood and affect. His behavior is normal. Judgment and thought content normal.     Assessment/Plan:   Patient was seen for a health maintenance exam.  Counseled the patient on health maintenance issues. Reviewed her health mainteance schedule and ordered appropriate tests (see orders.) Counseled on regular exercise and weight management. Recommend regular eye exams and dental cleaning.   The following issues were addressed today for health maintenance:   Jeffrey Guerrero was seen today for annual exam.  Diagnoses and all orders for  this visit:  Health maintenance examination -     Comprehensive metabolic panel -     PSA -     Lipid panel  Prediabetes -     Hemoglobin A1c  Dyslipidemia -     Lipid panel  Acquired hypothyroidism- compliant with thyroid meds Will check level -     TSH  Screening for prostate cancer  Screening for colon cancer- referral placed for colonoscopy -     [facility] COLONOSCOPY  Other orders -     Tdap vaccine greater than or equal to 7yo IM    No Follow-up on file.    Body mass index is 26.25 kg/m.:  Discussed the patient's BMI with patient. The BMI body mass index is 26.25 kg/m.     Future Appointments Date Time Provider [place]  09/29/2017 8:00 AM Forrest Moron, MD [facility]    Patient Instructions       IF you received an x-ray today, you will receive an invoice from [facility]. Please contact [facility] at [phone] with questions or concerns regarding your invoice.   IF you received labwork today, you will receive an invoice from [employer]. Please contact [facility] at [phone] with questions or concerns regarding your invoice.   Our billing staff will not be able to assist you with questions regarding bills from these companies.  You will be contacted with the lab results as soon as they are available. The fastest way to get your results is to activate your My Chart account. Instructions are located on the last page of this paperwork. If you have not heard from [place] regarding the results in 2 weeks, please contact this office.      Colorectal Cancer Screening Colorectal cancer screening is a group of tests used to check for colorectal cancer. Colorectal refers to your colon and rectum. Your colon and rectum are located at the end of your large intestine and carry your bowel movements out of your body. Why is colorectal cancer screening done? It is common for abnormal growths (polyps) to form in  the lining of your colon, especially as you get older. These polyps can be cancerous or become cancerous. If colorectal cancer is found at an early stage, it is treatable. Who should be screened for colorectal cancer? Screening is recommended for all adults at average risk starting at age 73. Tests may be recommended every 1 to 10 years. Your health care provider may recommend earlier or more frequent screening if you have:  A history of colorectal cancer or polyps.  A family member with a history of colorectal cancer or polyps.  Inflammatory bowel disease, such as ulcerative colitis or Crohn disease.  A type of hereditary colon cancer syndrome.  Colorectal cancer symptoms. Types  of screening tests There are several types of colorectal screening tests. They include:  Guaiac-based fecal occult blood testing.  Fecal immunochemical test (FIT).  Stool DNA test.  Barium enema.  Virtual colonoscopy.  Sigmoidoscopy. During this test, a sigmoidoscope is used to examine your rectum and lower colon. A sigmoidoscope is a flexible tube with a camera that is inserted through your anus into your rectum and lower colon.  Colonoscopy. During this test, a colonoscope is used to examine your entire colon. A colonoscope is a long, thin, flexible tube with a camera. This test examines your entire colon and rectum. This information is not intended to replace advice given to you by your health care provider. Make sure you discuss any questions you have with your health care provider. Document Released: 03/25/2010 Document Revised: 05/14/2016 Document Reviewed: 01/11/2014 Elsevier Interactive Patient Education  2017 [employer].

## 2016-09-23 NOTE — Patient Instructions (Addendum)
     IF you received an x-ray today, you will receive an invoice from [facility]. Please contact [facility] at [phone] with questions or concerns regarding your invoice.   IF you received labwork today, you will receive an invoice from [employer]. Please contact [facility] at [phone] with questions or concerns regarding your invoice.   Our billing staff will not be able to assist you with questions regarding bills from these companies.  You will be contacted with the lab results as soon as they are available. The fastest way to get your results is to activate your My Chart account. Instructions are located on the last page of this paperwork. If you have not heard from [place] regarding the results in 2 weeks, please contact this office.      Colorectal Cancer Screening Colorectal cancer screening is a group of tests used to check for colorectal cancer. Colorectal refers to your colon and rectum. Your colon and rectum are located at the end of your large intestine and carry your bowel movements out of your body. Why is colorectal cancer screening done? It is common for abnormal growths (polyps) to form in the lining of your colon, especially as you get older. These polyps can be cancerous or become cancerous. If colorectal cancer is found at an early stage, it is treatable. Who should be screened for colorectal cancer? Screening is recommended for all adults at average risk starting at age 52. Tests may be recommended every 1 to 10 years. Your health care provider may recommend earlier or more frequent screening if you have:  A history of colorectal cancer or polyps.  A family member with a history of colorectal cancer or polyps.  Inflammatory bowel disease, such as ulcerative colitis or Crohn disease.  A type of hereditary colon cancer syndrome.  Colorectal cancer symptoms. Types of screening tests There are several types of colorectal  screening tests. They include:  Guaiac-based fecal occult blood testing.  Fecal immunochemical test (FIT).  Stool DNA test.  Barium enema.  Virtual colonoscopy.  Sigmoidoscopy. During this test, a sigmoidoscope is used to examine your rectum and lower colon. A sigmoidoscope is a flexible tube with a camera that is inserted through your anus into your rectum and lower colon.  Colonoscopy. During this test, a colonoscope is used to examine your entire colon. A colonoscope is a long, thin, flexible tube with a camera. This test examines your entire colon and rectum. This information is not intended to replace advice given to you by your health care provider. Make sure you discuss any questions you have with your health care provider. Document Released: 03/25/2010 Document Revised: 05/14/2016 Document Reviewed: 01/11/2014 Elsevier Interactive Patient Education  2017 [employer].

## 2016-09-24 LAB — COMPREHENSIVE METABOLIC PANEL
ALBUMIN: 4.4 g/dL (ref 3.6–4.8)
ALT: 26 IU/L (ref 0–44)
AST: 22 IU/L (ref 0–40)
Albumin/Globulin Ratio: 1.6 (ref 1.2–2.2)
Alkaline Phosphatase: 52 IU/L (ref 39–117)
BUN / CREAT RATIO: 18 (ref 10–24)
BUN: 18 mg/dL (ref 8–27)
Bilirubin Total: 0.4 mg/dL (ref 0.0–1.2)
CALCIUM: 9.5 mg/dL (ref 8.6–10.2)
CO2: 26 mmol/L (ref 18–29)
CREATININE: 0.98 mg/dL (ref 0.76–1.27)
Chloride: 102 mmol/L (ref 96–106)
GFR calc Af Amer: 92 mL/min/{1.73_m2} (ref >59)
GFR, EST NON AFRICAN AMERICAN: 79 mL/min/{1.73_m2} (ref >59)
GLOBULIN, TOTAL: 2.7 g/dL (ref 1.5–4.5)
Glucose: 106 mg/dL — ABNORMAL HIGH (ref 65–99)
Potassium: 4.3 mmol/L (ref 3.5–5.2)
Sodium: 143 mmol/L (ref 134–144)
TOTAL PROTEIN: 7.1 g/dL (ref 6.0–8.5)

## 2016-09-24 LAB — LIPID PANEL
CHOLESTEROL TOTAL: 228 mg/dL — AB (ref 100–199)
Chol/HDL Ratio: 4.1 ratio units (ref 0.0–5.0)
HDL: 56 mg/dL (ref >39)
LDL CALC: 158 mg/dL — AB (ref 0–99)
TRIGLYCERIDES: 69 mg/dL (ref 0–149)
VLDL CHOLESTEROL CAL: 14 mg/dL (ref 5–40)

## 2016-09-24 LAB — HEMOGLOBIN A1C
Est. average glucose Bld gHb Est-mCnc: 120 mg/dL
Hgb A1c MFr Bld: 5.8 % — ABNORMAL HIGH (ref 4.8–5.6)

## 2016-09-24 LAB — TSH: TSH: 3.74 u[IU]/mL (ref 0.450–4.500)

## 2016-09-24 LAB — PSA: Prostate Specific Ag, Serum: 0.9 ng/mL (ref 0.0–4.0)

## 2016-09-25 ENCOUNTER — Other Ambulatory Visit: Payer: Self-pay | Admitting: Family Medicine

## 2016-09-25 DIAGNOSIS — Z0000 Encounter for general adult medical examination without abnormal findings: Secondary | ICD-10-CM

## 2016-09-25 MED ORDER — LEVOTHYROXINE SODIUM 100 MCG PO TABS: 100.0000 ug | ORAL_TABLET | Freq: Every day | ORAL | 3 refills | Status: DC

## 2016-09-25 MED ORDER — METFORMIN HCL 500 MG PO TABS
ORAL_TABLET | ORAL | 3 refills | Status: DC
Start: 2016-09-25 — End: 2017-10-07

## 2016-09-25 MED ORDER — AMLODIPINE BESYLATE 5 MG PO TABS: 5.0000 mg | ORAL_TABLET | Freq: Every day | ORAL | 3 refills | Status: DC

## 2016-09-25 MED ORDER — MELOXICAM 7.5 MG PO TABS
ORAL_TABLET | ORAL | 3 refills | Status: DC
Start: 2016-09-25 — End: 2018-04-04

## 2016-09-25 NOTE — Progress Notes (Signed)
Result note 

## 2016-09-26 ENCOUNTER — Encounter: Payer: Self-pay | Admitting: *Deleted

## 2016-12-10 ENCOUNTER — Encounter: Payer: Self-pay | Admitting: Internal Medicine

## 2016-12-20 NOTE — Progress Notes (Signed)
Cardiology Office Note   Date:  12/21/2016   ID:  [name], DOB 12/31/1950, MRN [id number]  PCP:  Venora Maples, MD  Cardiologist:   Dorris Carnes, MD   F/U of PV dz      History of Present Illness: Jeffrey Guerrero is a 66 y.o. male with a history of pulmonic valve endocarditis  Pulmonary emboli and septsis  I saw him in March 2016  He underwent PVR on 11/01/14  Echo showed peak gradient acrosss prosthesis of 18 mm Hg   I saw the pt in Jan 2017  Doing good  No CP  No SOB  No palpitaions  Rare dizziness     Current Meds  Medication Sig  . amLODipine (NORVASC) 5 MG tablet Take 1 tablet (5 mg total) by mouth daily.  Marland Kitchen aspirin 81 MG tablet Take 81 mg by mouth daily.  Marland Kitchen levothyroxine (SYNTHROID, LEVOTHROID) 100 MCG tablet Take 1 tablet (100 mcg total) by mouth daily before breakfast.  . meloxicam (MOBIC) 7.5 MG tablet Take 1 tablet (7.5mg ) by mouth daily as needed with food. No other NSAIDs.  . metFORMIN (GLUCOPHAGE) 500 MG tablet TAKE ONE TABLET (500 MG TOTAL) BY MOUTH 2 (TWO) TIMES DAILY WITH MEALS.     Allergies:   Patient has no known allergies.   Past Medical History:  Diagnosis Date  . Arthritis    "knees" (02/13/2014)  . 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 Diabetes mellitus without complication ([place])    takes metformin  . Hypertension   . Hypothyroidism   . Pulmonic valve insufficiency 02/16/2014   severe  . S/P pulmonic valve replacement with homograft 11/01/2014  . Thyroid disease     Past Surgical History:  Procedure Laterality Date  . AORTIC VALVE REPLACEMENT [name] 11/01/2014   Procedure: PULMONIC HOMOGRAFT VALVE REPLACEMENT ;  Surgeon: Rexene Alberts, MD;  Location: [place];  Service: Open Heart Surgery;  Laterality: [name];  . APPENDECTOMY    . COLONOSCOPY    . LEFT HEART  CATHETERIZATION WITH CORONARY ANGIOGRAM [name] 10/09/2014   Procedure: LEFT HEART CATHETERIZATION WITH CORONARY ANGIOGRAM;  Surgeon: Jettie Booze, MD;  Location: [facility] CATH LAB;  Service: Cardiovascular;  Laterality: [name];  . TEE WITHOUT CARDIOVERSION [name] 02/16/2014   Procedure: TRANSESOPHAGEAL ECHOCARDIOGRAM (TEE);  Surgeon: [name], MD;  Location: [facility] ENDOSCOPY;  Service: Cardiovascular;  Laterality: [name];  . TEE WITHOUT CARDIOVERSION [name] 04/12/2014   Procedure: TRANSESOPHAGEAL ECHOCARDIOGRAM (TEE);  Surgeon: [name], MD;  Location: [place];  Service: Cardiovascular;  Laterality: [name];  . TEE WITHOUT CARDIOVERSION [name] 11/01/2014   Procedure: TRANSESOPHAGEAL ECHOCARDIOGRAM (TEE);  Surgeon: Rexene Alberts, MD;  Location: [place];  Service: Open Heart Surgery;  Laterality: [name];  . TONSILLECTOMY       Social History:  The patient  reports that he has never smoked. He has never used smokeless tobacco. He reports that he drinks alcohol. He reports that he does not use drugs.   Family History:  The patient's family history is not on file.    ROS:  Please see the history of present illness. All other systems are reviewed and  Negative to the above problem except as noted.    PHYSICAL EXAM: VS:  BP (!) 150/94 (BP Location: Left Arm)   Pulse 61   Ht 6\' 1"  (1.854  m)   Wt 201 lb 3.2 oz (91.3 kg)   BMI 26.55 kg/m   GEN: Well nourished, well developed, in no acute distress  HEENT: normal  Neck: no JVD, carotid bruits, or masses Cardiac: RRR;  II/VI systolic mrmur LSB  , rubs, or gallops,no edema  Respiratory:  clear to auscultation bilaterally, normal work of breathing GI: soft, nontender, nondistended, + BS  No hepatomegaly  MS: no deformity Moving all extremities   Skin: warm and dry, no rash Neuro:  Strength and sensation are intact Psych: euthymic mood, full affect   EKG:  EKG is ordered today.SB 55  RBBB  Frst degree AV block  PR 204     Lipid Panel    Component Value  Date/Time   CHOL 228 (H) 09/23/2016 1002   TRIG 69 09/23/2016 1002   HDL 56 09/23/2016 1002   CHOLHDL 4.1 09/23/2016 1002   CHOLHDL 4.0 09/18/2015 1333   VLDL 14 09/18/2015 1333   LDLCALC 158 (H) 09/23/2016 1002      Wt Readings from Last 3 Encounters:  12/21/16 201 lb 3.2 oz (91.3 kg)  09/23/16 199 lb (90.3 kg)  11/16/15 208 lb (94.3 kg)      ASSESSMENT AND PLAN:  1  PV replacement  Doing good  Echo done 1 year ago  Will not plan for now  2  HL  Lipids higher  Would rx Crestor 5 mg  F/U in 2 months  [place]ah in 2015 with minimal CAD    3  HTN  BP is up today  He says usually better at home  Needs to bring cuff in to doctors office on next visit to confirm accuracy  Will not make changes now.   Current medicines are reviewed at length with the patient today.  The patient does not have concerns regarding medicines.  Signed, Dorris Carnes, MD  12/21/2016 8:23 AM    [address], [place], [place]  [address] Phone: [phone]; Fax: [phone]

## 2016-12-21 ENCOUNTER — Ambulatory Visit (INDEPENDENT_AMBULATORY_CARE_PROVIDER_SITE_OTHER): Payer: 59 | Admitting: Internal Medicine

## 2016-12-21 ENCOUNTER — Encounter: Payer: Self-pay | Admitting: Internal Medicine

## 2016-12-21 VITALS — BP 150/94 | HR 55 | Ht 73.0 in | Wt 201.2 lb

## 2016-12-21 DIAGNOSIS — I379 Nonrheumatic pulmonary valve disorder, unspecified: Secondary | ICD-10-CM | POA: Diagnosis not present

## 2016-12-21 DIAGNOSIS — E785 Hyperlipidemia, unspecified: Secondary | ICD-10-CM | POA: Diagnosis not present

## 2016-12-21 DIAGNOSIS — I10 Essential (primary) hypertension: Secondary | ICD-10-CM

## 2016-12-21 MED ORDER — ROSUVASTATIN CALCIUM 10 MG PO TABS: 10.0000 mg | ORAL_TABLET | Freq: Every day | ORAL | 3 refills | Status: DC

## 2016-12-21 NOTE — Patient Instructions (Signed)
Your physician has recommended you make the following change in your medication:  1.) start Crestor 10 mg ---take 1/2 tablet (5 mg) every evening for cholesterol (LDL).  Your physician recommends that you return for lab work in: 2 months (AST, LIPIDS)  Your physician wants you to follow-up in: 1 year with Dr. [name].  You will receive a reminder letter in the mail two months in advance. If you don't receive a letter, please call our office to schedule the follow-up appointment.

## 2017-02-23 ENCOUNTER — Other Ambulatory Visit: Payer: 59

## 2017-02-23 ENCOUNTER — Other Ambulatory Visit: Payer: 59 | Admitting: *Deleted

## 2017-02-23 DIAGNOSIS — I10 Essential (primary) hypertension: Secondary | ICD-10-CM

## 2017-02-23 DIAGNOSIS — E785 Hyperlipidemia, unspecified: Secondary | ICD-10-CM

## 2017-02-23 DIAGNOSIS — I379 Nonrheumatic pulmonary valve disorder, unspecified: Secondary | ICD-10-CM

## 2017-02-23 LAB — LIPID PANEL
CHOLESTEROL TOTAL: 133 mg/dL (ref 100–199)
Chol/HDL Ratio: 2.7 ratio (ref 0.0–5.0)
HDL: 50 mg/dL (ref >39)
LDL CALC: 70 mg/dL (ref 0–99)
TRIGLYCERIDES: 64 mg/dL (ref 0–149)
VLDL Cholesterol Cal: 13 mg/dL (ref 5–40)

## 2017-02-23 LAB — AST: AST: 28 IU/L (ref 0–40)

## 2017-04-07 ENCOUNTER — Encounter: Payer: Self-pay | Admitting: Family Medicine

## 2017-04-07 ENCOUNTER — Ambulatory Visit (INDEPENDENT_AMBULATORY_CARE_PROVIDER_SITE_OTHER): Payer: 59 | Admitting: Family Medicine

## 2017-04-07 VITALS — BP 136/81 | HR 57 | Temp 98.3°F | Resp 18 | Ht 71.26 in | Wt 180.0 lb

## 2017-04-07 DIAGNOSIS — I10 Essential (primary) hypertension: Secondary | ICD-10-CM

## 2017-04-07 DIAGNOSIS — E119 Type 2 diabetes mellitus without complications: Secondary | ICD-10-CM | POA: Diagnosis not present

## 2017-04-07 DIAGNOSIS — E785 Hyperlipidemia, unspecified: Secondary | ICD-10-CM | POA: Diagnosis not present

## 2017-04-07 LAB — BASIC METABOLIC PANEL
BUN / CREAT RATIO: 14 (ref 10–24)
BUN: 15 mg/dL (ref 8–27)
CHLORIDE: 104 mmol/L (ref 96–106)
CO2: 25 mmol/L (ref 20–29)
Calcium: 9.6 mg/dL (ref 8.6–10.2)
Creatinine, Ser: 1.05 mg/dL (ref 0.76–1.27)
GFR calc Af Amer: 84 mL/min/{1.73_m2} (ref >59)
GFR calc non Af Amer: 73 mL/min/{1.73_m2} (ref >59)
GLUCOSE: 98 mg/dL (ref 65–99)
Potassium: 4.1 mmol/L (ref 3.5–5.2)
SODIUM: 143 mmol/L (ref 134–144)

## 2017-04-07 LAB — POCT GLYCOSYLATED HEMOGLOBIN (HGB A1C): HEMOGLOBIN A1C: 5.8

## 2017-04-07 NOTE — Progress Notes (Signed)
Chief Complaint  Patient presents with  . Diabetes    6 mth- f/u    HPI   Diabetes Mellitus: Patient presents for follow up of diabetes. Symptoms: numbness of the fingers. Symptoms have been well-controlled. Patient denies hyperglycemia, hypoglycemia , increase appetite, nausea and polyuria.  Evaluation to date has been included: hemoglobin A1C.  Home sugars: BGs consistently in an acceptable range. Treatment to date: low cholesterol diet which has been effective and Continued metformin which has been effective Lab Results  Component Value Date   HGBA1C 5.8 04/07/2017    Lab Results  Component Value Date   HGBA1C 5.8 04/07/2017   Wt Readings from Last 3 Encounters:  04/07/17 180 lb (81.6 kg)  12/21/16 201 lb 3.2 oz (91.3 kg)  09/23/16 199 lb (90.3 kg)    Hypertension: Patient here for follow-up of elevated blood pressure. He is exercising and is adherent to low salt diet.  Blood pressure is well controlled at home. Cardiac symptoms none. Patient denies chest pain, dyspnea, fatigue and near-syncope.  Cardiovascular risk factors: diabetes mellitus, dyslipidemia, hypertension and male gender. Use of agents associated with hypertension: none.  BP Readings from Last 3 Encounters:  04/07/17 136/81  12/21/16 (!) 150/94  09/23/16 (!) 142/90    Hyperlipidemia: Patient presents with hyperlipidemia.  He was tested because heart disease.  His last labs see below He sticks to a low fat diet. Lab Results  Component Value Date   CHOL 133 02/23/2017   HDL 50 02/23/2017   LDLCALC 70 02/23/2017   TRIG 64 02/23/2017   CHOLHDL 2.7 02/23/2017       Past Medical History:  Diagnosis Date  . Arthritis    "knees" (02/13/2014)  . 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 Diabetes mellitus without  complication ([place])    takes metformin  . Hypertension   . Hypothyroidism   . Pulmonic valve insufficiency 02/16/2014   severe  . S/P pulmonic valve replacement with homograft 11/01/2014  . Thyroid disease     Current Outpatient Prescriptions  Medication Sig Dispense Refill  . amLODipine (NORVASC) 5 MG tablet Take 1 tablet (5 mg total) by mouth daily. 90 tablet 3  . aspirin 81 MG tablet Take 81 mg by mouth daily.    Marland Kitchen levothyroxine (SYNTHROID, LEVOTHROID) 100 MCG tablet Take 1 tablet (100 mcg total) by mouth daily before breakfast. 90 tablet 3  . meloxicam (MOBIC) 7.5 MG tablet Take 1 tablet (7.5mg ) by mouth daily as needed with food. No other NSAIDs. 30 tablet 3  . metFORMIN (GLUCOPHAGE) 500 MG tablet TAKE ONE TABLET (500 MG TOTAL) BY MOUTH 2 (TWO) TIMES DAILY WITH MEALS. 180 tablet 3  . rosuvastatin (CRESTOR) 10 MG tablet Take 1 tablet (10 mg total) by mouth daily. 90 tablet 3   No current facility-administered medications for this visit.     Allergies: No Known Allergies  Past Surgical History:  Procedure Laterality Date  . AORTIC VALVE REPLACEMENT [name] 11/01/2014   Procedure: PULMONIC HOMOGRAFT VALVE REPLACEMENT ;  Surgeon: Rexene Alberts, MD;  Location: [place];  Service: Open Heart Surgery;  Laterality: [name];  . APPENDECTOMY    . COLONOSCOPY    . LEFT HEART CATHETERIZATION WITH CORONARY ANGIOGRAM [name] 10/09/2014   Procedure: LEFT HEART CATHETERIZATION WITH CORONARY ANGIOGRAM;  Surgeon: Jettie Booze, MD;  Location[facility] CATH LAB;  Service: Cardiovascular;  Laterality: [name];  . TEE WITHOUT CARDIOVERSION [name] 02/16/2014   Procedure: TRANSESOPHAGEAL ECHOCARDIOGRAM (TEE);  Surgeon: [name], MD;  Location: [facility] ENDOSCOPY;  Service: Cardiovascular;  Laterality: [name];  . TEE WITHOUT CARDIOVERSION [name] 04/12/2014   Procedure: TRANSESOPHAGEAL ECHOCARDIOGRAM (TEE);  Surgeon: [name], MD;  Location: [place];  Service: Cardiovascular;  Laterality: [name];  . TEE WITHOUT CARDIOVERSION [name]  11/01/2014   Procedure: TRANSESOPHAGEAL ECHOCARDIOGRAM (TEE);  Surgeon: Rexene Alberts, MD;  Location: [place];  Service: Open Heart Surgery;  Laterality: [name];  . TONSILLECTOMY      Social History   Social History  . Marital status: Married    Spouse name: [name]  . Number of children: [name]  . Years of education: [name]   Occupational History  . Welder    Social History Main Topics  . Smoking status: Never Smoker  . Smokeless tobacco: Never Used  . Alcohol use Yes     Comment: occasional  . Drug use: No  . Sexual activity: Yes   Other Topics Concern  . None   Social History Narrative   Married       Review of Systems  Constitutional: Negative for chills, fever and weight loss.  Cardiovascular: Negative for chest pain and palpitations.  Gastrointestinal: Negative for abdominal pain, diarrhea, nausea and vomiting.  Neurological: Negative for dizziness, tingling and headaches.    Objective: Vitals:   04/07/17 0810  BP: 136/81  Pulse: (!) 57  Resp: 18  Temp: 98.3 F (36.8 C)  TempSrc: Oral  SpO2: 98%  Weight: 180 lb (81.6 kg)  Height: 5' 11.26" (1.81 m)    Physical Exam  Constitutional: He is oriented to person, place, and time. He appears well-developed and well-nourished.  HENT:  Head: Normocephalic and atraumatic.  Right Ear: External ear normal.  Eyes: Conjunctivae and EOM are normal.  Cardiovascular: Normal rate and regular rhythm.   Murmur heard. Pulmonary/Chest: Effort normal and breath sounds normal. No respiratory distress.  Abdominal: Soft. Bowel sounds are normal. He exhibits no distension. There is no tenderness. There is no guarding.  Neurological: He is alert and oriented to person, place, and time.    Assessment and Plan Dayle was seen today for diabetes.  Diagnoses and all orders for this visit:  Controlled type 2 diabetes mellitus without complication, without long-term current use of insulin (HCC) -     HM Diabetes Foot Exam -     POCT  glycosylated hemoglobin (Hb A1C) -     Basic metabolic panel -     Microalbumin, urine  Essential hypertension  Dyslipidemia   Problem List Items Addressed This Visit      Cardiovascular and Mediastinum   Essential hypertension, benign (Chronic)    At goal for bp, continue current meds. Will check urine microalbumin today      HTN (hypertension) (Chronic)     Endocrine   Controlled type 2 diabetes mellitus without complication, without long-term current use of insulin ([place]) - Primary    a1c at goal  Well controlled on metformin Will check creatinine today Continue ada diet      Relevant Orders   HM Diabetes Foot Exam (Completed)   POCT glycosylated hemoglobin (Hb A1C) (Completed)   Basic metabolic panel   Microalbumin, urine     Other   Dyslipidemia    Pt at goal for lipid panel, ast at goal Follow cardiology recs Answered pt questions about saturated fats  [place]

## 2017-04-07 NOTE — Assessment & Plan Note (Signed)
Pt at goal for lipid panel, ast at goal Follow cardiology recs Answered pt questions about saturated fats

## 2017-04-07 NOTE — Patient Instructions (Addendum)
   IF you received an x-ray today, you will receive an invoice from [facility]. Please contact [facility] at [phone] with questions or concerns regarding your invoice.   IF you received labwork today, you will receive an invoice from [facility]. Please contact [facility] at [phone] with questions or concerns regarding your invoice.   Our billing staff will not be able to assist you with questions regarding bills from these companies.  You will be contacted with the lab results as soon as they are available. The fastest way to get your results is to activate your My Chart account. Instructions are located on the last page of this paperwork. If you have not heard from us regarding the results in 2 weeks, please contact this office.      Heart-Healthy Eating Plan Heart-healthy meal planning includes:  Limiting unhealthy fats.  Increasing healthy fats.  Making other small dietary changes.  You may need to talk with your doctor or a diet specialist (dietitian) to create an eating plan that is right for you. What types of fat should I choose?  Choose healthy fats. These include olive oil and canola oil, flaxseeds, walnuts, almonds, and seeds.  Eat more omega-3 fats. These include salmon, mackerel, sardines, tuna, flaxseed oil, and ground flaxseeds. Try to eat fish at least twice each week.  Limit saturated fats. ? Saturated fats are often found in animal products, such as meats, butter, and cream. ? Plant sources of saturated fats include palm oil, palm kernel oil, and coconut oil.  Avoid foods with partially hydrogenated oils in them. These include stick margarine, some tub margarines, cookies, crackers, and other baked goods. These contain trans fats. What general guidelines do I need to follow?  Check food labels carefully. Identify foods with trans fats or high amounts of saturated fat.  Fill one half of your plate with vegetables and green salads. Eat  4-5 servings of vegetables per day. A serving of vegetables is: ? 1 cup of raw leafy vegetables. ?  cup of raw or cooked cut-up vegetables. ?  cup of vegetable juice.  Fill one fourth of your plate with whole grains. Look for the word "whole" as the first word in the ingredient list.  Fill one fourth of your plate with lean protein foods.  Eat 4-5 servings of fruit per day. A serving of fruit is: ? One medium whole fruit. ?  cup of dried fruit. ?  cup of fresh, frozen, or canned fruit. ?  cup of 100% fruit juice.  Eat more foods that contain soluble fiber. These include apples, broccoli, carrots, beans, peas, and barley. Try to get 20-30 g of fiber per day.  Eat more home-cooked food. Eat less restaurant, buffet, and fast food.  Limit or avoid alcohol.  Limit foods high in starch and sugar.  Avoid fried foods.  Avoid frying your food. Try baking, boiling, grilling, or broiling it instead. You can also reduce fat by: ? Removing the skin from poultry. ? Removing all visible fats from meats. ? Skimming the fat off of stews, soups, and gravies before serving them. ? Steaming vegetables in water or broth.  Lose weight if you are overweight.  Eat 4-5 servings of nuts, legumes, and seeds per week: ? One serving of dried beans or legumes equals  cup after being cooked. ? One serving of nuts equals 1 ounces. ? One serving of seeds equals  ounce or one tablespoon.  You may need to keep track of   how much salt or sodium you eat. This is especially true if you have high blood pressure. Talk with your doctor or dietitian to get more information. What foods can I eat? Grains Breads, including French, white, pita, wheat, raisin, rye, oatmeal, and Italian. Tortillas that are neither fried nor made with lard or trans fat. Low-fat rolls, including hotdog and hamburger buns and English muffins. Biscuits. Muffins. Waffles. Pancakes. Light popcorn. Whole-grain cereals. Flatbread. Melba  toast. Pretzels. Breadsticks. Rusks. Low-fat snacks. Low-fat crackers, including oyster, saltine, matzo, graham, animal, and rye. Rice and pasta, including brown rice and pastas that are made with whole wheat. Vegetables All vegetables. Fruits All fruits, but limit coconut. Meats and Other Protein Sources Lean, well-trimmed beef, veal, pork, and lamb. Chicken and turkey without skin. All fish and shellfish. Wild duck, rabbit, pheasant, and venison. Egg whites or low-cholesterol egg substitutes. Dried beans, peas, lentils, and tofu. Seeds and most nuts. Dairy Low-fat or nonfat cheeses, including ricotta, string, and mozzarella. Skim or 1% milk that is liquid, powdered, or evaporated. Buttermilk that is made with low-fat milk. Nonfat or low-fat yogurt. Beverages Mineral water. Diet carbonated beverages. Sweets and Desserts Sherbets and fruit ices. Honey, jam, marmalade, jelly, and syrups. Meringues and gelatins. Pure sugar candy, such as hard candy, jelly beans, gumdrops, mints, marshmallows, and small amounts of dark chocolate. Angel food cake. Eat all sweets and desserts in moderation. Fats and Oils Nonhydrogenated (trans-free) margarines. Vegetable oils, including soybean, sesame, sunflower, olive, peanut, safflower, corn, canola, and cottonseed. Salad dressings or mayonnaise made with a vegetable oil. Limit added fats and oils that you use for cooking, baking, salads, and as spreads. Other Cocoa powder. Coffee and tea. All seasonings and condiments. The items listed above may not be a complete list of recommended foods or beverages. Contact your dietitian for more options. What foods are not recommended? Grains Breads that are made with saturated or trans fats, oils, or whole milk. Croissants. Butter rolls. Cheese breads. Sweet rolls. Donuts. Buttered popcorn. Chow mein noodles. High-fat crackers, such as cheese or butter crackers. Meats and Other Protein Sources Fatty meats, such as  hotdogs, short ribs, sausage, spareribs, bacon, rib eye roast or steak, and mutton. High-fat deli meats, such as salami and bologna. Caviar. Domestic duck and goose. Organ meats, such as kidney, liver, sweetbreads, and heart. Dairy Cream, sour cream, cream cheese, and creamed cottage cheese. Whole-milk cheeses, including blue (bleu), Monterey Jack, Brie, [place], American, Havarti, Swiss, cheddar, Camembert, and [place]. Whole or 2% milk that is liquid, evaporated, or condensed. Whole buttermilk. Cream sauce or high-fat cheese sauce. Yogurt that is made from whole milk. Beverages Regular sodas and juice drinks with added sugar. Sweets and Desserts Frosting. Pudding. Cookies. Cakes other than angel food cake. Candy that has milk chocolate or white chocolate, hydrogenated fat, butter, coconut, or unknown ingredients. Buttered syrups. Full-fat ice cream or ice cream drinks. Fats and Oils Gravy that has suet, meat fat, or shortening. Cocoa butter, hydrogenated oils, palm oil, coconut oil, palm kernel oil. These can often be found in baked products, candy, fried foods, nondairy creamers, and whipped toppings. Solid fats and shortenings, including bacon fat, salt pork, lard, and butter. Nondairy cream substitutes, such as coffee creamers and sour cream substitutes. Salad dressings that are made of unknown oils, cheese, or sour cream. The items listed above may not be a complete list of foods and beverages to avoid. Contact your dietitian for more information. This information is not intended to replace advice given to   you by your health care provider. Make sure you discuss any questions you have with your health care provider. Document Released: 04/05/2012 Document Revised: 03/12/2016 Document Reviewed: 03/29/2014 Elsevier Interactive Patient Education  2018 [employer]  

## 2017-04-07 NOTE — Assessment & Plan Note (Signed)
At goal for bp, continue current meds. Will check urine microalbumin today

## 2017-04-07 NOTE — Assessment & Plan Note (Signed)
a1c at goal  Well controlled on metformin Will check creatinine today Continue ada diet

## 2017-04-08 LAB — MICROALBUMIN, URINE: Microalbumin, Urine: 9.9 ug/mL

## 2017-07-13 ENCOUNTER — Telehealth: Payer: Self-pay | Admitting: *Deleted

## 2017-07-13 MED ORDER — ATORVASTATIN CALCIUM 20 MG PO TABS: 20.0000 mg | ORAL_TABLET | Freq: Every day | ORAL | 3 refills | Status: DC

## 2017-07-13 NOTE — Telephone Encounter (Signed)
Received request from [facility] pharmacy to change rosuvastatin to different agent due to cost.  Per Dr. [name] patient should switch to Lipitor 20 mg once daily.  Called patient to inform.  Left detailed message (DPR) to inform of change due to cost and to call back if there are any concerns or questions.

## 2017-09-21 ENCOUNTER — Telehealth: Payer: Self-pay | Admitting: Family Medicine

## 2017-09-21 NOTE — Telephone Encounter (Signed)
Copied from [place] [id number]. Topic: Quick Communication - See Telephone Encounter >> Sep 21, 2017  9:44 AM Corie Chiquito, NT wrote: CRM for notification. See Telephone encounter for: Patient called because he went to the pharmacy pick up his Levothyroxine medication and was told that it is on a recall so he would like to know if there is a different medication that his doctor could put him on. If someone could please give him a call back.  09/21/17.

## 2017-09-21 NOTE — Telephone Encounter (Signed)
Left message for pt. To call pharmacy about recall. If needed ,have pharmacy call for refills.

## 2017-09-22 ENCOUNTER — Telehealth: Payer: Self-pay | Admitting: Family Medicine

## 2017-09-22 NOTE — Telephone Encounter (Signed)
Copied from [place]. Topic: Quick Communication - See Telephone Encounter >> Sep 22, 2017  9:54 AM Corie Chiquito, NT wrote: CRM for notification. See Telephone encounter for: [facility] called because they would like to know if it is ok to change the manufacturer for the Synthroid medication that this patient is on. If someone could please give them a call back at [phone]  09/22/17.

## 2017-09-24 NOTE — Telephone Encounter (Signed)
Ok to change manu factor for patient med.

## 2017-09-27 ENCOUNTER — Other Ambulatory Visit: Payer: Self-pay | Admitting: Family Medicine

## 2017-09-29 ENCOUNTER — Encounter: Payer: Self-pay | Admitting: Family Medicine

## 2017-09-29 ENCOUNTER — Ambulatory Visit (INDEPENDENT_AMBULATORY_CARE_PROVIDER_SITE_OTHER): Payer: 59 | Admitting: Family Medicine

## 2017-09-29 ENCOUNTER — Other Ambulatory Visit: Payer: Self-pay

## 2017-09-29 ENCOUNTER — Encounter: Payer: 59 | Admitting: Family Medicine

## 2017-09-29 VITALS — BP 140/78 | HR 72 | Temp 97.6°F | Resp 17 | Ht 71.26 in | Wt 191.8 lb

## 2017-09-29 DIAGNOSIS — Z0000 Encounter for general adult medical examination without abnormal findings: Secondary | ICD-10-CM | POA: Diagnosis not present

## 2017-09-29 DIAGNOSIS — Z1211 Encounter for screening for malignant neoplasm of colon: Secondary | ICD-10-CM

## 2017-09-29 DIAGNOSIS — E785 Hyperlipidemia, unspecified: Secondary | ICD-10-CM

## 2017-09-29 DIAGNOSIS — I10 Essential (primary) hypertension: Secondary | ICD-10-CM | POA: Diagnosis not present

## 2017-09-29 DIAGNOSIS — E119 Type 2 diabetes mellitus without complications: Secondary | ICD-10-CM

## 2017-09-29 DIAGNOSIS — K4090 Unilateral inguinal hernia, without obstruction or gangrene, not specified as recurrent: Secondary | ICD-10-CM

## 2017-09-29 DIAGNOSIS — Z125 Encounter for screening for malignant neoplasm of prostate: Secondary | ICD-10-CM

## 2017-09-29 MED ORDER — LEVOTHYROXINE SODIUM 100 MCG PO TABS: 100.0000 ug | ORAL_TABLET | Freq: Every day | ORAL | 1 refills | Status: DC

## 2017-09-29 NOTE — Telephone Encounter (Signed)
Phone call to pharmacy, spoke with PharmD Vicente Males. Relayed message that it is okay to change medication manufacturer for patient. Anna verbalizes understanding, closing note.

## 2017-09-29 NOTE — Progress Notes (Signed)
Chief Complaint  Patient presents with  . Annual Exam    Subjective:  Jeffrey Guerrero is a 66 y.o. male here for a health maintenance visit.  Patient is established pt  Patient Active Problem List   Diagnosis Date Noted  . Controlled type 2 diabetes mellitus without complication, without long-term current use of insulin ([place]) 04/07/2017  . Dyslipidemia 04/07/2017  . S/P pulmonic valve replacement with homograft 11/01/2014  . Preoperative cardiovascular examination   . Severe pulmonic insufficiency   . Adult hypothyroidism 06/12/2014  . Hyperglycemia 04/02/2014  . Pulmonic valve disease 04/02/2014  . Multiple skin nodules 04/02/2014  . Acute pulmonary embolism ([place]) 02/17/2014  . Other pulmonary embolism and infarction 02/17/2014  . Bacterial endocarditis - pulmonic valve vegetation with Streptococcus pneumoniae sepsis 02/16/2014  . Pulmonic valve insufficiency 02/16/2014  . Unspecified hypothyroidism 02/14/2014  . Essential hypertension, benign 02/14/2014  . Leukocytopenia, unspecified 02/14/2014  . Gram-positive bacteremia 02/14/2014  . ARF (acute renal failure) ([place]) 02/14/2014  . HTN (hypertension) 02/14/2014  . Benign essential HTN 02/14/2014  . Septic shock ([place]) 02/12/2014    Past Medical History:  Diagnosis Date  . Arthritis    "knees" (02/13/2014)  . 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 Diabetes mellitus without complication ([place])    takes metformin  . Hypertension   . Hypothyroidism   . Pulmonic valve insufficiency 02/16/2014   severe  . S/P pulmonic valve replacement with homograft 11/01/2014  . Thyroid disease     Past Surgical History:  Procedure Laterality Date  . AORTIC VALVE REPLACEMENT [name] 11/01/2014   Procedure: PULMONIC HOMOGRAFT VALVE REPLACEMENT ;  Surgeon:  Rexene Alberts, MD;  Location: [place];  Service: Open Heart Surgery;  Laterality: [name];  . APPENDECTOMY    . COLONOSCOPY    . LEFT HEART CATHETERIZATION WITH CORONARY ANGIOGRAM [name] 10/09/2014   Procedure: LEFT HEART CATHETERIZATION WITH CORONARY ANGIOGRAM;  Surgeon: Jettie Booze, MD;  Location: [facility] CATH LAB;  Service: Cardiovascular;  Laterality: [name];  . TEE WITHOUT CARDIOVERSION [name] 02/16/2014   Procedure: TRANSESOPHAGEAL ECHOCARDIOGRAM (TEE);  Surgeon: [name], MD;  Location: [facility] ENDOSCOPY;  Service: Cardiovascular;  Laterality: [name];  . TEE WITHOUT CARDIOVERSION [name] 04/12/2014   Procedure: TRANSESOPHAGEAL ECHOCARDIOGRAM (TEE);  Surgeon: [name], MD;  Location: [place];  Service: Cardiovascular;  Laterality: [name];  . TEE WITHOUT CARDIOVERSION [name] 11/01/2014   Procedure: TRANSESOPHAGEAL ECHOCARDIOGRAM (TEE);  Surgeon: Rexene Alberts, MD;  Location: [place];  Service: Open Heart Surgery;  Laterality: [name];  . TONSILLECTOMY       Outpatient Medications Prior to Visit  Medication Sig Dispense Refill  . amLODipine (NORVASC) 5 MG tablet Take 1 tablet (5 mg total) by mouth daily. 90 tablet 3  . aspirin 81 MG tablet Take 81 mg by mouth daily.    Marland Kitchen atorvastatin (LIPITOR) 20 MG tablet Take 1 tablet (20 mg total) by mouth daily. 90 tablet 3  . meloxicam (MOBIC) 7.5 MG tablet Take 1 tablet (7.5mg ) by mouth daily as needed with food. No other NSAIDs. 30 tablet 3  . metFORMIN (GLUCOPHAGE) 500 MG tablet TAKE ONE TABLET (500 MG TOTAL) BY MOUTH 2 (TWO) TIMES DAILY WITH MEALS. 180 tablet 3  . levothyroxine (SYNTHROID, LEVOTHROID) 100 MCG tablet TAKE 1 TABLET (100 MCG TOTAL) BY MOUTH DAILY BEFORE BREAKFAST. (Patient not taking: Reported on  09/29/2017) 90 tablet 0   No facility-administered medications prior to visit.     No Known Allergies   Family History  Problem Relation Age of Onset  . Heart disease Neg Hx      Health Habits: Dental Exam: up to date Eye Exam: up to date Exercise:  0 times/week on average Current exercise activities: walking/running   Social History   Socioeconomic History  . Marital status: Married    Spouse name: Not on file  . Number of children: Not on file  . Years of education: Not on file  . Highest education level: Not on file  Social Needs  . Financial resource strain: Not on file  . Food insecurity - worry: Not on file  . Food insecurity - inability: Not on file  . Transportation needs - medical: Not on file  . Transportation needs - non-medical: Not on file  Occupational History  . Occupation: Welder  Tobacco Use  . Smoking status: Never Smoker  . Smokeless tobacco: Never Used  Substance and Sexual Activity  . Alcohol use: Yes    Comment: occasional  . Drug use: No  . Sexual activity: Yes  Other Topics Concern  . Not on file  Social History Narrative   Married   Social History   Substance and Sexual Activity  Alcohol Use Yes   Comment: occasional   Social History   Tobacco Use  Smoking Status Never Smoker  Smokeless Tobacco Never Used   Social History   Substance and Sexual Activity  Drug Use No     Health Maintenance: See under health Maintenance activity for review of completion dates as well. Immunization History  Administered Date(s) Administered  . Influenza-Unspecified 07/19/2015, 07/18/2017  . Pneumococcal Conjugate-13 09/18/2015  . Pneumococcal Polysaccharide-23 11/16/2015  . Tdap 09/23/2016      Depression Screen-PHQ2/9 Depression screen [facility] 2/9 09/29/2017 04/07/2017 09/23/2016 11/16/2015 09/18/2015  Decreased Interest 0 0 0 0 0  Down, Depressed, Hopeless 0 0 0 0 0  PHQ - 2 Score 0 0 0 0 0       Depression Severity and Treatment Recommendations:  0-4= None  5-9= Mild / Treatment: Support, educate to call if worse; return in one month  10-14= Moderate / Treatment: Support, watchful waiting; Antidepressant or Psycotherapy  15-19= Moderately severe / Treatment: Antidepressant OR  Psychotherapy  >= 20 = Major depression, severe / Antidepressant AND Psychotherapy    Review of Systems   Review of Systems  Constitutional: Negative for chills and fever.  HENT: Negative for ear discharge, ear pain and tinnitus.   Respiratory: Negative for cough, shortness of breath and wheezing.   Cardiovascular: Negative for chest pain, palpitations and orthopnea.  Gastrointestinal: Negative for abdominal pain, diarrhea, nausea and vomiting.  Genitourinary: Negative for dysuria, frequency and urgency.  Skin: Negative for itching and rash.  Neurological: Negative for dizziness and headaches.  Psychiatric/Behavioral: Negative for depression. The patient is not nervous/anxious.     See HPI for ROS as well.    Objective:   Vitals:   09/29/17 1219  BP: 140/78  Pulse: 72  Resp: 17  Temp: 97.6 F (36.4 C)  TempSrc: Oral  SpO2: 96%  Weight: 191 lb 12.8 oz (87 kg)  Height: 5' 11.26" (1.81 m)    Body mass index is 26.56 kg/m.  Physical Exam  Constitutional: He is oriented to person, place, and time. He appears well-developed and well-nourished.  HENT:  Head: Normocephalic and atraumatic.  Right Ear: External ear  normal.  Left Ear: External ear normal.  Eyes: Conjunctivae and EOM are normal.  Neck: Normal range of motion. No thyromegaly present.  Cardiovascular: Normal rate, regular rhythm, normal heart sounds and intact distal pulses.  No murmur heard. Pulmonary/Chest: Effort normal and breath sounds normal. No respiratory distress. He has no wheezes. He has no rales.  Abdominal: Soft. Bowel sounds are normal. He exhibits no distension. There is no tenderness. There is no rebound.  Genitourinary: Rectum normal and prostate normal.  Musculoskeletal: Normal range of motion. He exhibits no edema.  Neurological: He is alert and oriented to person, place, and time. He has normal reflexes.  Skin: Skin is warm. No erythema.  Psychiatric: He has a normal mood and affect. His  behavior is normal. Judgment and thought content normal.       Assessment/Plan:   Patient was seen for a health maintenance exam.  Counseled the patient on health maintenance issues. Reviewed her health mainteance schedule and ordered appropriate tests (see orders.) Counseled on regular exercise and weight management. Recommend regular eye exams and dental cleaning.   The following issues were addressed today for health maintenance:   [place] was seen today for annual exam.  Diagnoses and all orders for this visit:  Health maintenance examination- discussed age appropriate screenings  Screen for colon cancer -     Ambulatory referral to Gastroenterology  Screening for prostate cancer -     PSA  Essential hypertension, benign  Non-recurrent unilateral inguinal hernia without obstruction or gangrene -     Ambulatory referral to General Surgery  Dyslipidemia -     Comprehensive metabolic panel -     Lipid panel  Controlled type 2 diabetes mellitus without complication, without long-term current use of insulin (HCC) -     Hemoglobin A1c  Other orders -     levothyroxine (SYNTHROID, LEVOTHROID) 100 MCG tablet; Take 1 tablet (100 mcg total) by mouth daily before breakfast.    No Follow-up on file.    Body mass index is 26.56 kg/m.:  Discussed the patient's BMI with patient. The BMI body mass index is 26.56 kg/m.     Future Appointments  Date Time Provider [place]  04/04/2018  8:20 AM Forrest Moron, MD PCP-PCP [facility]    Patient Instructions       IF you received an x-ray today, you will receive an invoice from [facility]. Please contact [facility] at [phone] with questions or concerns regarding your invoice.   IF you received labwork today, you will receive an invoice from [place]. Please contact [facility] at [phone] with questions or concerns regarding your invoice.   Our billing staff will not be able to assist you  with questions regarding bills from these companies.  You will be contacted with the lab results as soon as they are available. The fastest way to get your results is to activate your My Chart account. Instructions are located on the last page of this paperwork. If you have not heard from [place] regarding the results in 2 weeks, please contact this office.     Inguinal Hernia, Adult An inguinal hernia is when fat or the intestines push through the area where the leg meets the lower abdomen (groin) and create a rounded lump (bulge). This condition develops over time. There are three types of inguinal hernias. These types include:  Hernias that can be pushed back into the belly (are reducible).  Hernias that are not reducible (are incarcerated).  Hernias that  are not reducible and lose their blood supply (are strangulated). This type of hernia requires emergency surgery.  What are the causes? This condition is caused by having a weak spot in the muscles or tissue. This weakness lets the hernia poke through. This condition can be triggered by:  Suddenly straining the muscles of the lower abdomen.  Lifting heavy objects.  Straining to have a bowel movement. Difficult bowel movements (constipation) can lead to this.  Coughing.  What increases the risk? This condition is more likely to develop in:  Men.  Pregnant women.  People who: ? Are overweight. ? Work in jobs that require long periods of standing or heavy lifting. ? Have had an inguinal hernia before. ? Smoke or have lung disease. These factors can lead to long-lasting (chronic) coughing.  What are the signs or symptoms? Symptoms can depend on the size of the hernia. Often, a small inguinal hernia has no symptoms. Symptoms of a larger hernia include:  A lump in the groin. This is easier to see when the person is standing. It might not be visible when he or she is lying down.  Pain or burning in the groin. This occurs  especially when lifting, straining, or coughing.  A dull ache or a feeling of pressure in the groin.  A lump in the scrotum in men.  Symptoms of a strangulated inguinal hernia can include:  A bulge in the groin that is very painful and tender to the touch.  A bulge that turns red or purple.  Fever, nausea, and vomiting.  The inability to have a bowel movement or to pass gas.  How is this diagnosed? This condition is diagnosed with a medical history and physical exam. Your health care provider may feel your groin area and ask you to cough. How is this treated? Treatment for this condition varies depending on the size of your hernia and whether you have symptoms. If you do not have symptoms, your health care provider may have you watch your hernia carefully and come in for follow-up visits. If your hernia is larger or if you have symptoms, your treatment will include surgery. Follow these instructions at home: Lifestyle  Drink enough fluid to keep your urine clear or pale yellow.  Eat a diet that includes a lot of fiber. Eat plenty of fruits, vegetables, and whole grains. Talk with your health care provider if you have questions.  Avoid lifting heavy objects.  Avoid standing for long periods of time.  Do not use tobacco products, including cigarettes, chewing tobacco, or e-cigarettes. If you need help quitting, ask your health care provider.  Maintain a healthy weight. General instructions  Do not try to force the hernia back in.  Watch your hernia for any changes in color or size. Let your health care provider know if any changes occur.  Take over-the-counter and prescription medicines only as told by your health care provider.  Keep all follow-up visits as told by your health care provider. This is important. Contact a health care provider if:  You have a fever.  You have new symptoms.  Your symptoms get worse. Get help right away if:  You have pain in the groin  that suddenly gets worse.  A bulge in the groin gets bigger suddenly and does not go down.  You are a man and you have a sudden pain in the scrotum, or the size of your scrotum suddenly changes.  A bulge in the groin area becomes red  or purple and is painful to the touch.  You have nausea or vomiting that does not go away.  You feel your heart beating a lot more quickly than normal.  You cannot have a bowel movement or pass gas. This information is not intended to replace advice given to you by your health care provider. Make sure you discuss any questions you have with your health care provider. Document Released: 02/21/2009 Document Revised: 03/12/2016 Document Reviewed: 08/15/2014 Elsevier Interactive Patient Education  2018 [employer].

## 2017-09-29 NOTE — Patient Instructions (Addendum)
IF you received an x-ray today, you will receive an invoice from [facility] Rad[facility]. Please contact [facility] Radiology at [phone] with questions or concerns regarding your invoice.   IF you received labwork today, you will receive an invoice from [place]. Please contact [facility] at [phone] with questions or concerns regarding your invoice.   Our billing staff will not be able to assist you with questions regarding bills from these companies.  You will be contacted with the lab results as soon as they are available. The fastest way to get your results is to activate your My Chart account. Instructions are located on the last page of this paperwork. If you have not heard from [place] regarding the results in 2 weeks, please contact this office.     Inguinal Hernia, Adult An inguinal hernia is when fat or the intestines push through the area where the leg meets the lower abdomen (groin) and create a rounded lump (bulge). This condition develops over time. There are three types of inguinal hernias. These types include:  Hernias that can be pushed back into the belly (are reducible).  Hernias that are not reducible (are incarcerated).  Hernias that are not reducible and lose their blood supply (are strangulated). This type of hernia requires emergency surgery.  What are the causes? This condition is caused by having a weak spot in the muscles or tissue. This weakness lets the hernia poke through. This condition can be triggered by:  Suddenly straining the muscles of the lower abdomen.  Lifting heavy objects.  Straining to have a bowel movement. Difficult bowel movements (constipation) can lead to this.  Coughing.  What increases the risk? This condition is more likely to develop in:  Men.  Pregnant women.  People who: ? Are overweight. ? Work in jobs that require long periods of standing or heavy lifting. ? Have had an inguinal hernia before. ? Smoke or have lung  disease. These factors can lead to long-lasting (chronic) coughing.  What are the signs or symptoms? Symptoms can depend on the size of the hernia. Often, a small inguinal hernia has no symptoms. Symptoms of a larger hernia include:  A lump in the groin. This is easier to see when the person is standing. It might not be visible when he or she is lying down.  Pain or burning in the groin. This occurs especially when lifting, straining, or coughing.  A dull ache or a feeling of pressure in the groin.  A lump in the scrotum in men.  Symptoms of a strangulated inguinal hernia can include:  A bulge in the groin that is very painful and tender to the touch.  A bulge that turns red or purple.  Fever, nausea, and vomiting.  The inability to have a bowel movement or to pass gas.  How is this diagnosed? This condition is diagnosed with a medical history and physical exam. Your health care provider may feel your groin area and ask you to cough. How is this treated? Treatment for this condition varies depending on the size of your hernia and whether you have symptoms. If you do not have symptoms, your health care provider may have you watch your hernia carefully and come in for follow-up visits. If your hernia is larger or if you have symptoms, your treatment will include surgery. Follow these instructions at home: Lifestyle  Drink enough fluid to keep your urine clear or pale yellow.  Eat a diet that includes a lot of fiber.  Eat plenty of fruits, vegetables, and whole grains. Talk with your health care provider if you have questions.  Avoid lifting heavy objects.  Avoid standing for long periods of time.  Do not use tobacco products, including cigarettes, chewing tobacco, or e-cigarettes. If you need help quitting, ask your health care provider.  Maintain a healthy weight. General instructions  Do not try to force the hernia back in.  Watch your hernia for any changes in color or  size. Let your health care provider know if any changes occur.  Take over-the-counter and prescription medicines only as told by your health care provider.  Keep all follow-up visits as told by your health care provider. This is important. Contact a health care provider if:  You have a fever.  You have new symptoms.  Your symptoms get worse. Get help right away if:  You have pain in the groin that suddenly gets worse.  A bulge in the groin gets bigger suddenly and does not go down.  You are a man and you have a sudden pain in the scrotum, or the size of your scrotum suddenly changes.  A bulge in the groin area becomes red or purple and is painful to the touch.  You have nausea or vomiting that does not go away.  You feel your heart beating a lot more quickly than normal.  You cannot have a bowel movement or pass gas. This information is not intended to replace advice given to you by your health care provider. Make sure you discuss any questions you have with your health care provider. Document Released: 02/21/2009 Document Revised: 03/12/2016 Document Reviewed: 08/15/2014 Elsevier Interactive Patient Education  2018 [employer].

## 2017-09-30 LAB — COMPREHENSIVE METABOLIC PANEL
A/G RATIO: 1.5 (ref 1.2–2.2)
ALBUMIN: 4.8 g/dL (ref 3.6–4.8)
ALK PHOS: 64 IU/L (ref 39–117)
ALT: 29 IU/L (ref 0–44)
AST: 28 IU/L (ref 0–40)
BUN / CREAT RATIO: 15 (ref 10–24)
BUN: 14 mg/dL (ref 8–27)
Bilirubin Total: 0.5 mg/dL (ref 0.0–1.2)
CALCIUM: 9.6 mg/dL (ref 8.6–10.2)
CO2: 28 mmol/L (ref 20–29)
CREATININE: 0.92 mg/dL (ref 0.76–1.27)
Chloride: 102 mmol/L (ref 96–106)
GFR calc Af Amer: 98 mL/min/{1.73_m2} (ref >59)
GFR, EST NON AFRICAN AMERICAN: 85 mL/min/{1.73_m2} (ref >59)
GLOBULIN, TOTAL: 3.1 g/dL (ref 1.5–4.5)
Glucose: 94 mg/dL (ref 65–99)
Potassium: 4.6 mmol/L (ref 3.5–5.2)
SODIUM: 142 mmol/L (ref 134–144)
Total Protein: 7.9 g/dL (ref 6.0–8.5)

## 2017-09-30 LAB — LIPID PANEL
CHOL/HDL RATIO: 3.4 ratio (ref 0.0–5.0)
Cholesterol, Total: 188 mg/dL (ref 100–199)
HDL: 55 mg/dL (ref >39)
LDL CALC: 119 mg/dL — AB (ref 0–99)
Triglycerides: 71 mg/dL (ref 0–149)
VLDL Cholesterol Cal: 14 mg/dL (ref 5–40)

## 2017-09-30 LAB — HEMOGLOBIN A1C
ESTIMATED AVERAGE GLUCOSE: 123 mg/dL
Hgb A1c MFr Bld: 5.9 % — ABNORMAL HIGH (ref 4.8–5.6)

## 2017-09-30 LAB — PSA: Prostate Specific Ag, Serum: 4.5 ng/mL — ABNORMAL HIGH (ref 0.0–4.0)

## 2017-10-05 ENCOUNTER — Other Ambulatory Visit: Payer: Self-pay

## 2017-10-05 DIAGNOSIS — E039 Hypothyroidism, unspecified: Secondary | ICD-10-CM | POA: Insufficient documentation

## 2017-10-07 ENCOUNTER — Encounter: Payer: Self-pay | Admitting: Surgery

## 2017-10-07 ENCOUNTER — Telehealth: Payer: Self-pay | Admitting: Family Medicine

## 2017-10-07 ENCOUNTER — Ambulatory Visit (INDEPENDENT_AMBULATORY_CARE_PROVIDER_SITE_OTHER): Payer: 59 | Admitting: Surgery

## 2017-10-07 VITALS — BP 167/88 | HR 74 | Temp 98.5°F | Ht 73.0 in | Wt 200.0 lb

## 2017-10-07 DIAGNOSIS — K4090 Unilateral inguinal hernia, without obstruction or gangrene, not specified as recurrent: Secondary | ICD-10-CM

## 2017-10-07 MED ORDER — METFORMIN HCL 500 MG PO TABS: ORAL_TABLET | ORAL | 3 refills | Status: DC

## 2017-10-07 NOTE — Patient Instructions (Signed)
Inguinal Hernia, Adult An inguinal hernia is when fat or the intestines push through the area where the leg meets the lower belly (groin) and make a rounded lump (bulge). This condition happens over time. There are three types of inguinal hernias. These types include:  Hernias that can be pushed back into the belly (are reducible).  Hernias that cannot be pushed back into the belly (are incarcerated).  Hernias that cannot be pushed back into the belly and lose their blood supply (get strangulated). This type needs emergency surgery.  Follow these instructions at home: Lifestyle  Drink enough fluid to keep your urine (pee) clear or pale yellow.  Eat plenty of fruits, vegetables, and whole grains. These have a lot of fiber. Talk with your doctor if you have questions.  Avoid lifting heavy objects.  Avoid standing for long periods of time.  Do not use tobacco products. These include cigarettes, chewing tobacco, or e-cigarettes. If you need help quitting, ask your doctor.  Try to stay at a healthy weight. General instructions  Do not try to force the hernia back in.  Watch your hernia for any changes in color or size. Let your doctor know if there are any changes.  Take over-the-counter and prescription medicines only as told by your doctor.  Keep all follow-up visits as told by your doctor. This is important. Contact a doctor if:  You have a fever.  You have new symptoms.  Your symptoms get worse. Get help right away if:  The area where the legs meets the lower belly has: ? Pain that gets worse suddenly. ? A bulge that gets bigger suddenly and does not go down. ? A bulge that turns red or purple. ? A bulge that is painful to the touch.  You are a man and your scrotum: ? Suddenly feels painful. ? Suddenly changes in size.  You feel sick to your stomach (nauseous) and this feeling does not go away.  You throw up (vomit) and this keeps happening.  You feel your heart  beating a lot more quickly than normal.  You cannot poop (have a bowel movement) or pass gas. This information is not intended to replace advice given to you by your health care provider. Make sure you discuss any questions you have with your health care provider. Document Released: 11/05/2006 Document Revised: 03/12/2016 Document Reviewed: 08/15/2014 Elsevier Interactive Patient Education  2018 [employer]  

## 2017-10-07 NOTE — Telephone Encounter (Signed)
Spoke with pt's wife, Rosalyn Gess, and notified her that the pt's refill request for metformin has been sent to the [place], [place]; she verifes understanding.

## 2017-10-07 NOTE — Progress Notes (Signed)
Surgical Clinic History and Physical  Referring provider:  Venora Maples, MD [address], [place] [address]  HISTORY OF PRESENT ILLNESS (HPI):  Very pleasant 66 y.o. male presents for evaluation of Left inguinal hernia. Patient reports he's had it for "many years", but has noticed it "popping out" more over the past 6 months or so. He specifically and consistently denies any Left groin, scrotal, or abdominal pain, and says he's never had to manually reduce his hernia. While his hernia may remain "out" throughout the day, it has always reduced spontaneously when he lays down at night. 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 Patient otherwise denies regular constipation, straining with urination, frequent cough or smoking, bloating/distention, N/V, CP, or SOB.  PAST MEDICAL HISTORY (PMH):  Past Medical History:  Diagnosis Date  . Arthritis    "knees" (02/13/2014)  . 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 Diabetes mellitus without complication ([place])    takes metformin  . Hypertension   . Hypothyroidism   . Pulmonic valve insufficiency 02/16/2014   severe  . S/P pulmonic valve replacement with homograft 11/01/2014  . Thyroid disease      PAST SURGICAL HISTORY ([place]):  Past Surgical History:  Procedure Laterality Date  . AORTIC VALVE REPLACEMENT [name] 11/01/2014   Procedure: PULMONIC HOMOGRAFT VALVE REPLACEMENT ;  Surgeon: Rexene Alberts, MD;  Location: [place];  Service: Open Heart Surgery;  Laterality: [name];  . APPENDECTOMY    . COLONOSCOPY    . LEFT HEART CATHETERIZATION WITH CORONARY ANGIOGRAM [name] 10/09/2014   Procedure: LEFT HEART CATHETERIZATION WITH CORONARY ANGIOGRAM;   Surgeon: Jettie Booze, MD;  [facility] C[facility] LAB;  Service: Cardiovascular;  Laterality: [name];  . TEE WITHOUT CARDIOVERSION [name] 02/16/2014   Procedure: TRANSESOPHAGEAL ECHOCARDIOGRAM (TEE);  Surgeon: [name], MD;  Location: [facility] ENDOSCOPY;  Service: Cardiovascular;  Laterality: [name];  . TEE WITHOUT CARDIOVERSION [name] 04/12/2014   Procedure: TRANSESOPHAGEAL ECHOCARDIOGRAM (TEE);  Surgeon: [name], MD;  Location: [place];  Service: Cardiovascular;  Laterality: [name];  . TEE WITHOUT CARDIOVERSION [name] 11/01/2014   Procedure: TRANSESOPHAGEAL ECHOCARDIOGRAM (TEE);  Surgeon: Rexene Alberts, MD;  Location: [place];  Service: Open Heart Surgery;  Laterality: [name];  . TONSILLECTOMY       MEDICATIONS:  Prior to Admission medications   Medication Sig Start Date End Date Taking? Authorizing Provider  amLODipine (NORVASC) 5 MG tablet Take 1 tablet (5 mg total) by mouth daily. 09/25/16  Yes Delia Chimes A, MD  aspirin 81 MG tablet Take 81 mg by mouth daily.   Yes [provider]  atorvastatin (LIPITOR) 20 MG tablet Take 1 tablet (20 mg total) by mouth daily. 07/13/17  Yes [name], MD  levothyroxine (SYNTHROID, LEVOTHROID) 100 MCG tablet Take 1 tablet (100 mcg total) by mouth daily before breakfast. 09/29/17  Yes Stallings, Zoe A, MD  meloxicam (MOBIC) 7.5 MG tablet Take 1 tablet (7.5mg ) by mouth daily as needed with food. No other NSAIDs. 09/25/16  Yes Stallings, Zoe A, MD  metFORMIN (GLUCOPHAGE) 500 MG tablet TAKE ONE TABLET (500 MG TOTAL) BY MOUTH 2 (TWO) TIMES DAILY WITH MEALS. 10/07/17  Yes Forrest Moron, MD  rivaroxaban (XARELTO) 20 MG  TABS tablet Take 1 tablet by mouth 1 day or 1 dose. 04/19/14  Yes [provider]     ALLERGIES:  No Known Allergies   SOCIAL HISTORY:  Social History   Socioeconomic History  . Marital status: Married    Spouse name: Not on file  . Number of children: Not on file  . Years of education: Not on file  . Highest education level:  Not on file  Social Needs  . Financial resource strain: Not on file  . Food insecurity - worry: Not on file  . Food insecurity - inability: Not on file  . Transportation needs - medical: Not on file  . Transportation needs - non-medical: Not on file  Occupational History  . Occupation: Welder  Tobacco Use  . Smoking status: Never Smoker  . Smokeless tobacco: Never Used  Substance and Sexual Activity  . Alcohol use: Yes    Comment: occasional  . Drug use: No  . Sexual activity: Yes  Other Topics Concern  . Not on file  Social History Narrative   Married    The patient currently resides (home / rehab facility / nursing home): Home The patient normally is (ambulatory / bedbound): Ambulatory  FAMILY HISTORY:  Family History  Problem Relation Age of Onset  . Heart disease Neg Hx     Otherwise negative/non-contributory.  REVIEW OF SYSTEMS:  Constitutional: denies any other weight loss, fever, chills, or sweats  Eyes: denies any other vision changes, history of eye injury  ENT: denies sore throat, hearing problems  Respiratory: denies shortness of breath, wheezing  Cardiovascular: denies chest pain, palpitations  Gastrointestinal: abdominal pain, N/V, and bowel function as per HPI Musculoskeletal: denies any other joint pains or cramps  Skin: Denies any other rashes or skin discolorations Neurological: denies any other headache, dizziness, weakness  Psychiatric: Denies any other depression, anxiety   All other review of systems were otherwise negative   VITAL SIGNS:  BP (!) 167/88   Pulse 74   Temp 98.5 F (36.9 C) (Oral)   Ht 6\' 1"  (1.854 m)   Wt 200 lb (90.7 kg)   BMI 26.39 kg/m    PHYSICAL EXAM:  Constitutional:  -- Normal body habitus  -- Awake, alert, and oriented x3  Eyes:  -- Pupils equally round and reactive to light  -- No scleral icterus  Ear, nose, throat:  -- No jugular venous distension -- No nasal drainage, bleeding Pulmonary:  -- No crackles   -- Equal breath sounds bilaterally -- Breathing non-labored at rest Cardiovascular:  -- S1, S2 present  -- No pericardial rubs  Gastrointestinal:  -- Abdomen soft, nontender, non-distended, no guarding/rebound -- Non-tender moderately large and reducible Left inguinal hernia; no Right inguinal hernia appreciated -- No other abdominal masses appreciated, pulsatile or otherwise  Musculoskeletal and Integumentary:  -- Wounds or skin discoloration: None appreciated -- Extremities: B/L UE and LE FROM, hands and feet warm, no edema  Neurologic:  -- Motor function: Intact and symmetric -- Sensation: Intact and symmetric  Labs:  CBC:  Lab Results  Component Value Date   WBC 3.3 (L) 09/18/2015   RBC 4.71 09/18/2015   BMP:  Lab Results  Component Value Date   GLUCOSE 94 09/29/2017   GLUCOSE 95 09/18/2015   CO2 28 09/29/2017   BUN 14 09/29/2017   CREATININE 0.92 09/29/2017   CREATININE 0.87 09/18/2015   CALCIUM 9.6 09/29/2017     Imaging studies: No pertinent imaging studies available for review  Assessment/Plan:  66 y.o. male with an essentially asymptomatic or minimally symptomatic reducible moderately large Left inguinal hernia without obstruction or gangrene, complicated by co-morbidities including DM, HTN, HLD, hypothyroidism, pulmonic valve replacement with vegetations on TEE (strep bacterial endocarditis), history of anticoagulation for PE, and osteoarthritis.   - maintain hydration and high fiber diet to minimize constipation  - discussed with patient indications for surgical repair of hernia and signs/symptoms of incarcerated hernia   - strategies/techniques for manual self-reduction of patient's inguinal hernia were also reviewed and discussed  - instructed to call surgery office or present to ED if bloating/abdominal distention/abdominal pain or N/V with unable to reduce hernia >4 hours  - if hernia becomes more symptomatic, will require pre-surgical medical risk  stratification and optimization prior to surgery, though likely not prohibitive considering patient's current activity tolerance  - return to clinic as needed, instructed to call if any questions or concerns  - agree with routine screening colonoscopy  All of the above recommendations were discussed with the patient, and all of patient's questions were answered to his expressed satisfaction.  Thank you for the opportunity to participate in this patient's care.  -- Marilynne Drivers Rosana Hoes, MD, [place]: [facility] - Partnering for exceptional care. Office: [phone]

## 2017-10-07 NOTE — Telephone Encounter (Signed)
Copied from [place] [id number]. Topic: Quick Communication - Rx Refill/Question >> Oct 07, 2017  8:30 AM Antonieta Iba C wrote: Self.   Pt called in to request a refill on his Metformin Rx. Pt says that he has been waiting for day, per pharmacy several request has been sent. Not showing in system. Pt would like as soon as possible.   Pharmacy: [id number] - [place], [place] - [address]   Agent: Please be advised that RX refills may take up to 3 business days. We ask that you follow-up with your pharmacy.

## 2017-10-22 ENCOUNTER — Telehealth: Payer: Self-pay | Admitting: Family Medicine

## 2017-10-22 DIAGNOSIS — Z0000 Encounter for general adult medical examination without abnormal findings: Secondary | ICD-10-CM

## 2017-10-22 MED ORDER — AMLODIPINE BESYLATE 5 MG PO TABS: 5.0000 mg | ORAL_TABLET | Freq: Every day | ORAL | 3 refills | Status: DC

## 2017-10-22 NOTE — Telephone Encounter (Signed)
Copied from [place]. Topic: Quick Communication - See Telephone Encounter >> Oct 22, 2017  2:01 PM Burnis Medin, NT wrote: CRM for notification. See Telephone encounter for: Pt said the pharmacy has sent over several request for a refill for amLODipine (NORVASC) 5 MG tablet. Pt uses [id number] - [place], [place] [address]  10/22/17.

## 2017-10-25 ENCOUNTER — Telehealth: Payer: Self-pay

## 2017-10-25 ENCOUNTER — Other Ambulatory Visit: Payer: Self-pay

## 2017-10-25 DIAGNOSIS — Z1211 Encounter for screening for malignant neoplasm of colon: Secondary | ICD-10-CM

## 2017-10-25 NOTE — Telephone Encounter (Signed)
Gastroenterology Pre-Procedure Review  Request Date: 11/05/17 Requesting Physician: Dr. Vicente Males  PATIENT REVIEW QUESTIONS: The patient responded to the following health history questions as indicated:    1. Are you having any GI issues? no 2. Do you have a personal history of Polyps? no 3. Do you have a family history of Colon Cancer or Polyps? no 4. Diabetes Mellitus? yes (Oral Metformin) 5. Joint replacements in the past 12 months?no 6. Major health problems in the past 3 months?no 7. Any artificial heart valves, MVP, or defibrillator?no    MEDICATIONS & ALLERGIES:    Patient reports the following regarding taking any anticoagulation/antiplatelet therapy:   Plavix, Coumadin, Eliquis, Xarelto, Lovenox, Pradaxa, Brilinta, or Effient? no Aspirin? no  Patient confirms/reports the following medications:  Current Outpatient Medications  Medication Sig Dispense Refill  . amLODipine (NORVASC) 5 MG tablet Take 1 tablet (5 mg total) by mouth daily. 90 tablet 3  . aspirin 81 MG tablet Take 81 mg by mouth daily.    Marland Kitchen atorvastatin (LIPITOR) 20 MG tablet Take 1 tablet (20 mg total) by mouth daily. 90 tablet 3  . levothyroxine (SYNTHROID, LEVOTHROID) 100 MCG tablet Take 1 tablet (100 mcg total) by mouth daily before breakfast. 90 tablet 1  . meloxicam (MOBIC) 7.5 MG tablet Take 1 tablet (7.5mg ) by mouth daily as needed with food. No other NSAIDs. 30 tablet 3  . metFORMIN (GLUCOPHAGE) 500 MG tablet TAKE ONE TABLET (500 MG TOTAL) BY MOUTH 2 (TWO) TIMES DAILY WITH MEALS. 180 tablet 3   No current facility-administered medications for this visit.     Patient confirms/reports the following allergies:  No Known Allergies  No orders of the defined types were placed in this encounter.   AUTHORIZATION INFORMATION Primary Insurance: 1D#: Group #:  Secondary Insurance: 1D#: Group #:  SCHEDULE INFORMATION: Date: 11/05/17 Time: Location:[facility]

## 2017-11-05 ENCOUNTER — Ambulatory Visit
Admission: RE | Admit: 2017-11-05 | Discharge: 2017-11-05 | Disposition: A | Discharge status: Home / Self Care | Payer: 59 | Source: Ambulatory Visit | Attending: Gastroenterology | Admitting: Gastroenterology

## 2017-11-05 ENCOUNTER — Encounter
Admission: RE | Disposition: A | Discharge status: Home / Self Care | Payer: Self-pay | Source: Ambulatory Visit | Attending: Gastroenterology

## 2017-11-05 ENCOUNTER — Ambulatory Visit: Payer: 59 | Admitting: Anesthesiology

## 2017-11-05 DIAGNOSIS — Z8679 Personal history of other diseases of the circulatory system: Secondary | ICD-10-CM | POA: Insufficient documentation

## 2017-11-05 DIAGNOSIS — Z7982 Long term (current) use of aspirin: Secondary | ICD-10-CM | POA: Insufficient documentation

## 2017-11-05 DIAGNOSIS — M170 Bilateral primary osteoarthritis of knee: Secondary | ICD-10-CM | POA: Insufficient documentation

## 2017-11-05 DIAGNOSIS — D124 Benign neoplasm of descending colon: Secondary | ICD-10-CM

## 2017-11-05 DIAGNOSIS — Z79899 Other long term (current) drug therapy: Secondary | ICD-10-CM | POA: Insufficient documentation

## 2017-11-05 DIAGNOSIS — Z7984 Long term (current) use of oral hypoglycemic drugs: Secondary | ICD-10-CM | POA: Diagnosis not present

## 2017-11-05 DIAGNOSIS — E039 Hypothyroidism, unspecified: Secondary | ICD-10-CM | POA: Insufficient documentation

## 2017-11-05 DIAGNOSIS — Z952 Presence of prosthetic heart valve: Secondary | ICD-10-CM | POA: Insufficient documentation

## 2017-11-05 DIAGNOSIS — Z1211 Encounter for screening for malignant neoplasm of colon: Secondary | ICD-10-CM | POA: Diagnosis present

## 2017-11-05 DIAGNOSIS — E119 Type 2 diabetes mellitus without complications: Secondary | ICD-10-CM | POA: Insufficient documentation

## 2017-11-05 DIAGNOSIS — K5730 Diverticulosis of large intestine without perforation or abscess without bleeding: Secondary | ICD-10-CM | POA: Insufficient documentation

## 2017-11-05 DIAGNOSIS — Z86711 Personal history of pulmonary embolism: Secondary | ICD-10-CM | POA: Diagnosis not present

## 2017-11-05 DIAGNOSIS — I10 Essential (primary) hypertension: Secondary | ICD-10-CM | POA: Insufficient documentation

## 2017-11-05 HISTORY — PX: COLONOSCOPY WITH PROPOFOL: SHX5780

## 2017-11-05 LAB — GLUCOSE, CAPILLARY: Glucose-Capillary: 90 mg/dL (ref 65–99)

## 2017-11-05 SURGERY — COLONOSCOPY WITH PROPOFOL
Anesthesia: General

## 2017-11-05 MED ORDER — SODIUM CHLORIDE 0.9 % IV SOLN
INTRAVENOUS | Status: DC
  Administered 2017-11-05: 1000 mL via INTRAVENOUS

## 2017-11-05 MED ORDER — PROPOFOL 10 MG/ML IV BOLUS
INTRAVENOUS | Status: DC | PRN
  Administered 2017-11-05: 70 mg via INTRAVENOUS
  Administered 2017-11-05: 30 mg via INTRAVENOUS

## 2017-11-05 MED ORDER — LIDOCAINE HCL (PF) 1 % IJ SOLN
INTRAMUSCULAR | Status: AC
  Administered 2017-11-05: 0.3 mL via INTRADERMAL
  Filled 2017-11-05: qty 2

## 2017-11-05 MED ORDER — PROPOFOL 500 MG/50ML IV EMUL
INTRAVENOUS | Status: DC | PRN
  Administered 2017-11-05: 120 ug/kg/min via INTRAVENOUS

## 2017-11-05 MED ORDER — LIDOCAINE HCL (PF) 1 % IJ SOLN
2.0000 mL | Freq: Once | INTRAMUSCULAR | Status: AC
  Administered 2017-11-05: 0.3 mL via INTRADERMAL

## 2017-11-05 MED ORDER — LIDOCAINE 2% (20 MG/ML) 5 ML SYRINGE
INTRAMUSCULAR | Status: DC | PRN
  Administered 2017-11-05: 25 mg via INTRAVENOUS

## 2017-11-05 NOTE — Anesthesia Postprocedure Evaluation (Signed)
Anesthesia Post Note  Patient: Gustabo Gordillo  Procedure(s) Performed: COLONOSCOPY WITH PROPOFOL ([name] )  Patient location during evaluation: Endoscopy Anesthesia Type: General Level of consciousness: awake and alert Pain management: pain level controlled Vital Signs Assessment: post-procedure vital signs reviewed and stable Respiratory status: spontaneous breathing, nonlabored ventilation, respiratory function stable and patient connected to nasal cannula oxygen Cardiovascular status: blood pressure returned to baseline and stable Postop Assessment: no apparent nausea or vomiting Anesthetic complications: no     Last Vitals:  Vitals:   11/05/17 0917 11/05/17 0937  BP: 123/83 (!) 141/82  Pulse:    Resp:    Temp:    SpO2:      Last Pain:  Vitals:   11/05/17 3779  TempSrc: Tympanic                 Precious Haws Shykeria Sakamoto

## 2017-11-05 NOTE — Transfer of Care (Signed)
Immediate Anesthesia Transfer of Care Note  Patient: Jeffrey Guerrero  Procedure(s) Performed: COLONOSCOPY WITH PROPOFOL ([name] )  Patient Location: Endoscopy Unit  Anesthesia Type:General  Level of Consciousness: awake and alert   Airway & Oxygen Therapy: Patient Spontanous Breathing and Patient connected to nasal cannula oxygen  Post-op Assessment: Report given to RN and Post -op Vital signs reviewed and stable  Post vital signs: Reviewed  Last Vitals:  Vitals:   11/05/17 0712 11/05/17 0907  BP: 132/82 107/77  Pulse: (!) 58 60  Resp: 17 18  Temp: (!) 35.7 C (!) 36.2 C  SpO2: 100% 100%    Last Pain:  Vitals:   11/05/17 0712  TempSrc: Tympanic         Complications: No apparent anesthesia complications

## 2017-11-05 NOTE — H&P (Signed)
[name], MD [address] [place], [place], [place], [address] [address], [place], [place], [place], [address] Phone: [phone]  Fax: [phone]  Primary Care Physician:  Forrest Moron, MD   Pre-Procedure History & Physical: HPI:  Jeffrey Guerrero is a 67 y.o. male is here for an colonoscopy.   Past Medical History:  Diagnosis Date  . Arthritis    "knees" (02/13/2014)  . 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 Diabetes mellitus without complication ([place])    takes metformin  . Hypertension   . Hypothyroidism   . Pulmonic valve insufficiency 02/16/2014   severe  . S/P pulmonic valve replacement with homograft 11/01/2014  . Thyroid disease     Past Surgical History:  Procedure Laterality Date  . AORTIC VALVE REPLACEMENT [name] 11/01/2014   Procedure: PULMONIC HOMOGRAFT VALVE REPLACEMENT ;  Surgeon: Rexene Alberts, MD;  Location: [place];  Service: Open Heart Surgery;  Laterality: [name];  . APPENDECTOMY    . COLONOSCOPY    . LEFT HEART CATHETERIZATION WITH CORONARY ANGIOGRAM [name] 10/09/2014   Procedure: LEFT HEART CATHETERIZATION WITH CORONARY ANGIOGRAM;  Surgeon: Jettie Booze, MD;  Location: [facility] CATH LAB;  Service: Cardiovascular;  Laterality: [name];  . TEE WITHOUT CARDIOVERSION [name] 02/16/2014   Procedure: TRANSESOPHAGEAL ECHOCARDIOGRAM (TEE);  Surgeon: [name], MD;  Location: [facility] ENDOSCOPY;  Service: Cardiovascular;  Laterality: [name];  . TEE WITHOUT CARDIOVERSION [name] 04/12/2014   Procedure: TRANSESOPHAGEAL ECHOCARDIOGRAM (TEE);  Surgeon: [name], MD;  Location: [place];  Service: Cardiovascular;  Laterality: [name];  . TEE WITHOUT CARDIOVERSION [name] 11/01/2014   Procedure: TRANSESOPHAGEAL ECHOCARDIOGRAM (TEE);  Surgeon: Rexene Alberts, MD;  Location: [place];  Service: Open  Heart Surgery;  Laterality: [name];  . TONSILLECTOMY      Prior to Admission medications   Medication Sig Start Date End Date Taking? Authorizing Provider  amLODipine (NORVASC) 5 MG tablet Take 1 tablet (5 mg total) by mouth daily. 10/22/17   Forrest Moron, MD  aspirin 81 MG tablet Take 81 mg by mouth daily.    [provider]  atorvastatin (LIPITOR) 20 MG tablet Take 1 tablet (20 mg total) by mouth daily. 07/13/17   [name], MD  levothyroxine (SYNTHROID, LEVOTHROID) 100 MCG tablet Take 1 tablet (100 mcg total) by mouth daily before breakfast. 09/29/17   Forrest Moron, MD  meloxicam (MOBIC) 7.5 MG tablet Take 1 tablet (7.5mg ) by mouth daily as needed with food. No other NSAIDs. 09/25/16   Forrest Moron, MD  metFORMIN (GLUCOPHAGE) 500 MG tablet TAKE ONE TABLET (500 MG TOTAL) BY MOUTH 2 (TWO) TIMES DAILY WITH MEALS. 10/07/17   Forrest Moron, MD    Allergies as of 10/26/2017  . (No Known Allergies)    Family History  Problem Relation Age of Onset  . Heart disease Neg Hx     Social History   Socioeconomic History  . Marital status: Married    Spouse name: Not on file  . Number of children: Not on file  . Years of education: Not on file  . Highest education level: Not on file  Social Needs  . Financial resource strain: Not on file  . Food insecurity - worry: Not on file  . Food insecurity - inability: Not on file  . Transportation needs -  medical: Not on file  . Transportation needs - non-medical: Not on file  Occupational History  . Occupation: Welder  Tobacco Use  . Smoking status: Never Smoker  . Smokeless tobacco: Never Used  Substance and Sexual Activity  . Alcohol use: Yes    Comment: occasional  . Drug use: No  . Sexual activity: Yes  Other Topics Concern  . Not on file  Social History Narrative   Married    Review of Systems: See HPI, otherwise negative ROS  Physical Exam: There were no vitals taken for this visit. General:   Alert,   pleasant and cooperative in NAD Head:  Normocephalic and atraumatic. Neck:  Supple; no masses or thyromegaly. Lungs:  Clear throughout to auscultation, normal respiratory effort.    Heart:  +S1, +S2, Regular rate and rhythm, No edema. Abdomen:  Soft, nontender and nondistended. Normal bowel sounds, without guarding, and without rebound.   Neurologic:  Alert and  oriented x4;  grossly normal neurologically.  Impression/Plan: Jeffrey Guerrero is here for an colonoscopy to be performed for Screening colonoscopy average risk   Risks, benefits, limitations, and alternatives regarding  colonoscopy have been reviewed with the patient.  Questions have been answered.  All parties agreeable.   [name], MD  11/05/2017, 7:35 AM

## 2017-11-05 NOTE — Op Note (Signed)
[facility] Gastroenterology Patient Name: Jeffrey Guerrero Procedure Date: 11/05/2017 8:16 AM MRN: [id number] Account #: [id number] Date of Birth: [birth year] Admit Type: Outpatient Age: 67 Room: [facility] ENDO ROOM 4 Gender: Male Note Status: Finalized Procedure:            Colonoscopy Indications:          Screening for colorectal malignant neoplasm Providers:            [name] MD, MD Referring MD:         Arlie Solomons. Nolon Rod (Referring MD) Medicines:            Monitored Anesthesia Care Complications:        No immediate complications. Procedure:            Pre-Anesthesia Assessment:                       - Prior to the procedure, a History and Physical was                        performed, and patient medications, allergies and                        sensitivities were reviewed. The patient's tolerance of                        previous anesthesia was reviewed.                       - The risks and benefits of the procedure and the                        sedation options and risks were discussed with the                        patient. All questions were answered and informed                        consent was obtained.                       - ASA Grade Assessment: III - A patient with severe                        systemic disease.                       After obtaining informed consent, the colonoscope was                        passed under direct vision. Throughout the procedure,                        the patient's blood pressure, pulse, and oxygen                        saturations were monitored continuously. The                        Colonoscope was introduced through the anus and  advanced to the the cecum, identified by the                        appendiceal orifice, IC valve and transillumination.                        The colonoscopy was performed with moderate difficulty                        due to a tortuous colon. Successful  completion of the                        procedure was aided by withdrawing the scope and                        replacing with the adult endoscope. The quality of the                        bowel preparation was good. Findings:      The perianal and digital rectal examinations were normal.      Multiple small-mouthed diverticula were found in the sigmoid colon.      Four sessile polyps were found in the descending colon. The polyps were       5 to 7 mm in size. These polyps were removed with a cold snare.       Resection and retrieval were complete.      A 8 mm polyp was found in the descending colon. The polyp was sessile.       The polyp was removed with a hot snare. Resection and retrieval were       complete.      A 3 mm polyp was found in the descending colon. The polyp was sessile.       The polyp was removed with a cold biopsy forceps. Resection and       retrieval were complete.      The exam was otherwise without abnormality on direct and retroflexion       views. Impression:           - Diverticulosis in the sigmoid colon.                       - Four 5 to 7 mm polyps in the descending colon,                        removed with a cold snare. Resected and retrieved.                       - One 8 mm polyp in the descending colon, removed with                        a hot snare. Resected and retrieved.                       - One 3 mm polyp in the descending colon, removed with                        a cold biopsy forceps. Resected and retrieved.                       -  The examination was otherwise normal on direct and                        retroflexion views. Recommendation:       - Discharge patient to home (with escort).                       - Resume previous diet.                       - Continue present medications.                       - Await pathology results.                       - Repeat colonoscopy in 3 years for surveillance. Procedure Code(s):    ---  Professional ---                       [phone], Colonoscopy, flexible; with removal of tumor(s),                        polyp(s), or other lesion(s) by snare technique                       45380, 18, Colonoscopy, flexible; with biopsy, single                        or multiple Diagnosis Code(s):    --- Professional ---                       D12.4, Benign neoplasm of descending colon                       Z12.11, Encounter for screening for malignant neoplasm                        of colon                       K57.30, Diverticulosis of large intestine without                        perforation or abscess without bleeding CPT copyright 2016 American Medical Association. All rights reserved. The codes documented in this report are preliminary and upon coder review may  be revised to meet current compliance requirements. [name], MD [name] MD, MD 11/05/2017 9:05:30 AM This report has been signed electronically. Number of Addenda: 0 Note Initiated On: 11/05/2017 8:16 AM Scope Withdrawal Time: 0 hours 27 minutes 46 seconds  Total Procedure Duration: 0 hours 40 minutes 50 seconds       [facility]

## 2017-11-05 NOTE — Anesthesia Post-op Follow-up Note (Signed)
Anesthesia QCDR form completed.        

## 2017-11-05 NOTE — Anesthesia Preprocedure Evaluation (Signed)
Anesthesia Evaluation  Patient identified by MRN, date of birth, ID band Patient awake    Reviewed: Allergy & Precautions, H&P , NPO status , Patient's Chart, lab work & pertinent test results  History of Anesthesia Complications Negative for: history of anesthetic complications  Airway Mallampati: III  TM Distance: >3 FB Neck ROM: limited    Dental  (+) Poor Dentition, Missing   Pulmonary neg pulmonary ROS, neg shortness of breath,           Cardiovascular Exercise Tolerance: Good hypertension, (-) angina(-) Past MI      Neuro/Psych negative neurological ROS  negative psych ROS   GI/Hepatic negative GI ROS, Neg liver ROS, neg GERD  ,  Endo/Other  diabetes, Type 2Hypothyroidism   Renal/GU Renal disease  negative genitourinary   Musculoskeletal  (+) Arthritis ,   Abdominal   Peds  Hematology negative hematology ROS (+)   Anesthesia Other Findings Past Medical History: No date: Arthritis     Comment:  "knees" (02/13/2014) 02/16/2014: Bacterial endocarditis - pulmonic valve vegetation with  Streptococcus pneumoniae sepsis     Comment:  Pulmonic valve vegetations on TEE with blood cultures               positive for Streptococcus pneumoniae, complicated by               pulmonary emboli  No date: Diabetes mellitus without complication (HCC)     Comment:  takes metformin No date: Hypertension No date: Hypothyroidism 02/16/2014: Pulmonic valve insufficiency     Comment:  severe 11/01/2014: S/P pulmonic valve replacement with homograft No date: Thyroid disease  Past Surgical History: 11/01/2014: AORTIC VALVE REPLACEMENT; [name]     Comment:  Procedure: PULMONIC HOMOGRAFT VALVE REPLACEMENT ;                Surgeon: Rexene Alberts, MD;  Location: [place];  Service:              Open Heart Surgery;  Laterality: [name]; No date: APPENDECTOMY No date: COLONOSCOPY 10/09/2014: LEFT HEART CATHETERIZATION WITH CORONARY ANGIOGRAM;  [name]     Comment:  Procedure: LEFT HEART CATHETERIZATION WITH CORONARY               ANGIOGRAM;  Surgeon: Jettie Booze, MD;  Location:               [facility] CATH LAB;  Service: Cardiovascular;  Laterality: [name]; 02/16/2014: TEE WITHOUT CARDIOVERSION; [name]     Comment:  Procedure: TRANSESOPHAGEAL ECHOCARDIOGRAM (TEE);                Surgeon: [name], MD;  Location: [facility] ENDOSCOPY;                Service: Cardiovascular;  Laterality: [name]; 04/12/2014: TEE WITHOUT CARDIOVERSION; [name]     Comment:  Procedure: TRANSESOPHAGEAL ECHOCARDIOGRAM (TEE);                Surgeon: [name], MD;  Location: [facility] ENDOSCOPY;                Service: Cardiovascular;  Laterality: [name]; 11/01/2014: TEE WITHOUT CARDIOVERSION; [name]     Comment:  Procedure: TRANSESOPHAGEAL ECHOCARDIOGRAM (TEE);                Surgeon: Rexene Alberts, MD;  Location: [place];  Service:              Open Heart Surgery;  Laterality: [name]; No date: TONSILLECTOMY  Reproductive/Obstetrics negative OB ROS                             Anesthesia Physical Anesthesia Plan  ASA: III  Anesthesia Plan: General   Post-op Pain Management:    Induction: Intravenous  PONV Risk Score and Plan: Propofol infusion  Airway Management Planned: Natural Airway and Nasal Cannula  Additional Equipment:   Intra-op Plan:   Post-operative Plan:   Informed Consent: I have reviewed the patients History and Physical, chart, labs and discussed the procedure including the risks, benefits and alternatives for the proposed anesthesia with the patient or authorized representative who has indicated his/her understanding and acceptance.   Dental Advisory Given  Plan Discussed with: Anesthesiologist, CRNA and Surgeon  Anesthesia Plan Comments: (Patient consented for risks of anesthesia including but not limited to:  - adverse reactions to medications - risk of intubation if required - damage to teeth, lips or other oral  mucosa - sore throat or hoarseness - Damage to heart, brain, lungs or loss of life  Patient voiced understanding.)        Anesthesia Quick Evaluation

## 2017-11-06 ENCOUNTER — Encounter: Payer: Self-pay | Admitting: Gastroenterology

## 2017-11-08 LAB — SURGICAL PATHOLOGY

## 2017-11-10 ENCOUNTER — Encounter: Payer: Self-pay | Admitting: Gastroenterology

## 2017-12-27 ENCOUNTER — Other Ambulatory Visit: Payer: Self-pay | Admitting: Family Medicine

## 2017-12-27 DIAGNOSIS — R972 Elevated prostate specific antigen [PSA]: Secondary | ICD-10-CM

## 2017-12-31 ENCOUNTER — Telehealth: Payer: Self-pay | Admitting: Family Medicine

## 2017-12-31 ENCOUNTER — Encounter: Payer: Self-pay | Admitting: Internal Medicine

## 2017-12-31 NOTE — Telephone Encounter (Signed)
Pt given psa lab results and recommendations per notes of Dr. Nolon Rod on 12/27/17. Pt verbalized understanding.

## 2018-01-10 ENCOUNTER — Ambulatory Visit: Payer: 59 | Admitting: Internal Medicine

## 2018-01-10 ENCOUNTER — Encounter: Payer: Self-pay | Admitting: Internal Medicine

## 2018-01-10 VITALS — BP 134/84 | HR 63 | Ht 73.0 in | Wt 192.4 lb

## 2018-01-10 DIAGNOSIS — E782 Mixed hyperlipidemia: Secondary | ICD-10-CM

## 2018-01-10 DIAGNOSIS — I10 Essential (primary) hypertension: Secondary | ICD-10-CM

## 2018-01-10 DIAGNOSIS — I379 Nonrheumatic pulmonary valve disorder, unspecified: Secondary | ICD-10-CM | POA: Diagnosis not present

## 2018-01-10 NOTE — Patient Instructions (Signed)
Your physician recommends that you continue on your current medications as directed. Please refer to the Current Medication list given to you today. Your physician wants you to follow-up in: 1 year with Dr. Ross.  You will receive a reminder letter in the mail two months in advance. If you don't receive a letter, please call our office to schedule the follow-up appointment.  

## 2018-01-10 NOTE — Progress Notes (Signed)
Cardiology Office Note   Date:  01/10/2018   ID:  Jeffrey Guerrero, DOB 02-22-1951, MRN [id number]  PCP:  Forrest Moron, MD  Cardiologist:   Dorris Carnes, MD   F/U of PV dz      History of Present Illness: Jeffrey Guerrero is a 67 y.o. male with a history of pulmonic valve endocarditis  Pulmonary emboli and septsis  I saw him in March 2016  He underwent PVR on 11/01/14  Echo showed peak gradient acrosss prosthesis of 18 mm Hg   I saw the pt in March 2018 Since seen he says his breating is OK   He denies CP   No dizziness  No fevers      Current Meds  Medication Sig  . amLODipine (NORVASC) 5 MG tablet Take 1 tablet (5 mg total) by mouth daily.  Marland Kitchen aspirin 81 MG tablet Take 81 mg by mouth daily.  Marland Kitchen atorvastatin (LIPITOR) 20 MG tablet Take 1 tablet (20 mg total) by mouth daily.  Marland Kitchen levothyroxine (SYNTHROID, LEVOTHROID) 100 MCG tablet Take 1 tablet (100 mcg total) by mouth daily before breakfast.  . meloxicam (MOBIC) 7.5 MG tablet Take 1 tablet (7.5mg ) by mouth daily as needed with food. No other NSAIDs.  . metFORMIN (GLUCOPHAGE) 500 MG tablet TAKE ONE TABLET (500 MG TOTAL) BY MOUTH 2 (TWO) TIMES DAILY WITH MEALS.     Allergies:   Patient has no known allergies.   Past Medical History:  Diagnosis Date  . Arthritis    "knees" (02/13/2014)  . 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 Diabetes mellitus without complication ([place])    takes metformin  . Hypertension   . Hypothyroidism   . Pulmonic valve insufficiency 02/16/2014   severe  . S/P pulmonic valve replacement with homograft 11/01/2014  . Thyroid disease     Past Surgical History:  Procedure Laterality Date  . AORTIC VALVE REPLACEMENT [name] 11/01/2014   Procedure: PULMONIC HOMOGRAFT VALVE REPLACEMENT ;  Surgeon: Rexene Alberts, MD;  Location: [place];  Service: Open Heart Surgery;  Laterality: [name];  . APPENDECTOMY    . COLONOSCOPY    . COLONOSCOPY WITH PROPOFOL [name] 11/05/2017   Procedure: COLONOSCOPY WITH PROPOFOL;  Surgeon: [name], MD;  Location: [facility] ENDOSCOPY;  Service: Gastroenterology;  Laterality: [name];  . LEFT HEART CATHETERIZATION WITH CORONARY ANGIOGRAM [name] 10/09/2014   Procedure: LEFT HEART CATHETERIZATION WITH CORONARY ANGIOGRAM;  Surgeon: Jettie Booze, MD;  Location: [facility] CATH LAB;  Service: Cardiovascular;  Laterality: [name];  . TEE WITHOUT CARDIOVERSION [name] 02/16/2014   Procedure: TRANSESOPHAGEAL ECHOCARDIOGRAM (TEE);  Surgeon: [name], MD;  Location: [facility] ENDOSCOPY;  Service: Cardiovascular;  Laterality: [name];  . TEE WITHOUT CARDIOVERSION [name] 04/12/2014   Procedure: TRANSESOPHAGEAL ECHOCARDIOGRAM (TEE);  Surgeon: [name], MD;  Location: [place];  Service: Cardiovascular;  Laterality: [name];  . TEE WITHOUT CARDIOVERSION [name] 11/01/2014   Procedure: TRANSESOPHAGEAL ECHOCARDIOGRAM (TEE);  Surgeon: Rexene Alberts, MD;  Location: [place];  Service: Open Heart Surgery;  Laterality: [name];  . TONSILLECTOMY       Social History:  The patient  reports that he has never smoked. He has never used smokeless tobacco. He reports that he drinks alcohol. He reports that he does not use drugs.   Family History:  The patient's family history is not on file.  ROS:  Please see the history of present illness. All other systems are reviewed and  Negative to the above problem except as noted.    PHYSICAL EXAM: VS:  BP 134/84   Pulse 63   Ht 6\' 1"  (1.854 m)   Wt 192 lb 6.4 oz (87.3 kg)   BMI 25.38 kg/m   GEN: Well nourished, well developed, in no acute distress  HEENT: normal  Neck: no JVD, carotid bruits, or masses Cardiac: RRR;  II/VI systolic mrmur LLSB    No rubs, or gallops,no edema  Respiratory:  clear to auscultation bilaterally, normal work of breathing GI: soft, nontender, nondistended, + BS  No hepatomegaly    MS: no deformity Moving all extremities   Skin: warm and dry, no rash Neuro:  Strength and sensation are intact Psych: euthymic mood, full affect   EKG:  EKG is ordered today.SB 63  RBBB  Frst degree AV block  PR 210 msec     Lipid Panel    Component Value Date/Time   CHOL 188 09/29/2017 1338   TRIG 71 09/29/2017 1338   HDL 55 09/29/2017 1338   CHOLHDL 3.4 09/29/2017 1338   CHOLHDL 4.0 09/18/2015 1333   VLDL 14 09/18/2015 1333   LDLCALC 119 (H) 09/29/2017 1338      Wt Readings from Last 3 Encounters:  01/10/18 192 lb 6.4 oz (87.3 kg)  11/05/17 198 lb (89.8 kg)  10/07/17 200 lb (90.7 kg)      ASSESSMENT AND PLAN:  1  PV replacement  Doing good  Echo done Jan 2017   Follow for now    2  HL  Pt on 10 mg lipitor  (cheaper for him than crestor)   LDL is higher than  I think it should be  Will increase to 20 mg  3  HTN  BP is OK   He says is 120s to 130s at home   COntinue meds   F/U in 1 year    Current medicines are reviewed at length with the patient today.  The patient does not have concerns regarding medicines.  Signed, Dorris Carnes, MD  01/10/2018 4:47 PM    [address], [place], [place]  [address] Phone: [phone]; Fax: [phone]

## 2018-04-03 NOTE — Progress Notes (Signed)
Chief Complaint  Patient presents with  . hypertension/bloodwork    6 month f/u    HPI   Hypertension: Patient here for follow-up of elevated blood pressure. He saw  Dr. [name] in March 2019 who continued all his meds and discussed that his last Echo was good.  He is exercising by walking and is adherent to low salt diet.  Blood pressure is well controlled at home. Cardiac symptoms none. Patient denies chest pain, chest pressure/discomfort, claudication, exertional chest pressure/discomfort, fatigue, irregular heart beat, lower extremity edema, orthopnea and palpitations.  BP Readings from Last 3 Encounters:  04/04/18 124/77  01/10/18 134/84  11/05/17 (!) 141/82    Arthritis Pt reports that he does not get much flare ups from his arthritis His last refill of his meloxicam was a year ago  He stands long hours   Inguinal hernia - left He reports that his hernia has been bulging out more  He states that he is standing long hours He states that his hernia is not causing any pain He has no constipation He states that if he is standing for along period he will see the bulge It usually goes away after BM and also after resting for a while He works at the [employer] and is doing lots of extra hours standing and walking  Dyslipidemia: Patient presents for evaluation of lipids.  Compliance with treatment thus far has been excellent.  A repeat fasting lipid profile was done.  The patient does not use medications that may worsen dyslipidemias (corticosteroids, progestins, anabolic steroids, diuretics, beta-blockers, amiodarone, cyclosporine, olanzapine). The patient exercises daily.  The patient is not known to have coexisting coronary artery disease.   Lab Results  Component Value Date   CHOL 188 09/29/2017   CHOL 133 02/23/2017   CHOL 228 (H) 09/23/2016   Lab Results  Component Value Date   HDL 55 09/29/2017   HDL 50 02/23/2017   HDL 56 09/23/2016   Lab Results  Component Value  Date   LDLCALC 119 (H) 09/29/2017   LDLCALC 70 02/23/2017   LDLCALC 158 (H) 09/23/2016   Lab Results  Component Value Date   TRIG 71 09/29/2017   TRIG 64 02/23/2017   TRIG 69 09/23/2016   Lab Results  Component Value Date   CHOLHDL 3.4 09/29/2017   CHOLHDL 2.7 02/23/2017   CHOLHDL 4.1 09/23/2016   No results found for: LDLDIRECT    Past Medical History:  Diagnosis Date  . Arthritis    "knees" (02/13/2014)  . 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 Diabetes mellitus without complication ([place])    takes metformin  . Hypertension   . Hypothyroidism   . Pulmonic valve insufficiency 02/16/2014   severe  . S/P pulmonic valve replacement with homograft 11/01/2014  . Thyroid disease     Current Outpatient Medications  Medication Sig Dispense Refill  . amLODipine (NORVASC) 5 MG tablet Take 1 tablet (5 mg total) by mouth daily. 90 tablet 3  . aspirin 81 MG tablet Take 81 mg by mouth daily.    Jeffrey Guerrero atorvastatin (LIPITOR) 20 MG tablet Take 1 tablet (20 mg total) by mouth daily. 90 tablet 3  . levothyroxine (SYNTHROID, LEVOTHROID) 100 MCG tablet Take 1 tablet (100 mcg total) by mouth daily before breakfast. 90 tablet 1  . meloxicam (MOBIC) 7.5 MG tablet Take 1  tablet (7.5mg ) by mouth daily as needed with food. No other NSAIDs. 30 tablet 3  . metFORMIN (GLUCOPHAGE) 500 MG tablet TAKE ONE TABLET (500 MG TOTAL) BY MOUTH 2 (TWO) TIMES DAILY WITH MEALS. 180 tablet 3   No current facility-administered medications for this visit.     Allergies: No Known Allergies  Past Surgical History:  Procedure Laterality Date  . AORTIC VALVE REPLACEMENT [name] 11/01/2014   Procedure: PULMONIC HOMOGRAFT VALVE REPLACEMENT ;  Surgeon: Rexene Alberts, MD;  Location: [place];  Service: Open Heart Surgery;  Laterality: [name];  .  APPENDECTOMY    . COLONOSCOPY    . COLONOSCOPY WITH PROPOFOL [name] 11/05/2017   Procedure: COLONOSCOPY WITH PROPOFOL;  Surgeon: [name], MD;  Location: [facility] ENDOSCOPY;  Service: Gastroenterology;  Laterality: [name];  . LEFT HEART CATHETERIZATION WITH CORONARY ANGIOGRAM [name] 10/09/2014   Procedure: LEFT HEART CATHETERIZATION WITH CORONARY ANGIOGRAM;  Surgeon: Jettie Booze, MD;  Location[facility] CATH LAB;  Service: Cardiovascular;  Laterality: [name];  . TEE WITHOUT CARDIOVERSION [name] 02/16/2014   Procedure: TRANSESOPHAGEAL ECHOCARDIOGRAM (TEE);  Surgeon: [name], MD;  Location: [facility] ENDOSCOPY;  Service: Cardiovascular;  Laterality: [name];  . TEE WITHOUT CARDIOVERSION [name] 04/12/2014   Procedure: TRANSESOPHAGEAL ECHOCARDIOGRAM (TEE);  Surgeon: [name], MD;  Location: [place];  Service: Cardiovascular;  Laterality: [name];  . TEE WITHOUT CARDIOVERSION [name] 11/01/2014   Procedure: TRANSESOPHAGEAL ECHOCARDIOGRAM (TEE);  Surgeon: Rexene Alberts, MD;  Location: [place];  Service: Open Heart Surgery;  Laterality: [name];  . TONSILLECTOMY      Social History   Socioeconomic History  . Marital status: Married    Spouse name: Not on file  . Number of children: Not on file  . Years of education: Not on file  . Highest education level: Not on file  Occupational History  . Occupation: Archivist  . Financial resource strain: Not on file  . Food insecurity:    Worry: Not on file    Inability: Not on file  . Transportation needs:    Medical: Not on file    Non-medical: Not on file  Tobacco Use  . Smoking status: Never Smoker  . Smokeless tobacco: Never Used  Substance and Sexual Activity  . Alcohol use: Yes    Comment: occasional  . Drug use: No  . Sexual activity: Yes  Lifestyle  . Physical activity:    Days per week: Not on file    Minutes per session: Not on file  . Stress: Not on file  Relationships  . Social connections:    Talks on phone: Not on file    Gets together: Not  on file    Attends religious service: Not on file    Active member of club or organization: Not on file    Attends meetings of clubs or organizations: Not on file    Relationship status: Not on file  Other Topics Concern  . Not on file  Social History Narrative   Married    Family History  Problem Relation Age of Onset  . Heart disease Neg Hx      ROS Review of Systems See HPI Constitution: No fevers or chills No malaise No diaphoresis Skin: No rash or itching Eyes: no blurry vision, no double vision GU: no dysuria or hematuria Neuro: no dizziness or headaches  all others reviewed and negative   Objective: Vitals:   04/04/18 0820  BP: 124/77  Pulse: 74  Resp: 16  Temp: 99.5 F (37.5 C)  TempSrc: Oral  SpO2: 98%  Weight: 191 lb 9.6 oz (86.9 kg)  Height: 6\' 1"  (1.854 m)    Physical Exam Physical Exam  Constitutional: She is oriented to person, place, and time. She appears well-developed and well-nourished.  HENT:  Head: Normocephalic and atraumatic.  Eyes: Conjunctivae and EOM are normal.  Cardiovascular: Normal rate, regular rhythm and normal heart sounds.   Pulmonary/Chest: Effort normal and breath sounds normal. No respiratory distress. She has no wheezes.  Abdominal: Normal appearance and bowel sounds are normal. There is no tenderness. There is no CVA tenderness. Left inguinal hernia quite large measuring about 10 cm with large bulge that is not reducible even with patient in trendelenburg Neurological: She is alert and oriented to person, place, and time.   Assessment and Plan Merel was seen today for hypertension/bloodwork.  Diagnoses and all orders for this visit:  Essential hypertension, benign -  continue current medications - DASH diet reviewed - discussed monitoring schedule for renal function - advised ways to prevent cardiovascular disease  -     Comprehensive metabolic panel  Non-recurrent unilateral inguinal hernia without obstruction  or gangrene- pt will continue mobic His renal function is good He takes it sparingly  Controlled type 2 diabetes mellitus without complication, without long-term current use of insulin (HCC) -     Comprehensive metabolic panel -     Hemoglobin A1c  Dyslipidemia Discussed medications that affect lipids Reminded patient to avoid grapefruits Reviewed last 3 lipids Discussed current meds: statin, aspirin Advised dietary fiber and fish oil and ways to keep HDL high CAD prevention and reviewed side effects of statins Patient is a nonsmoker -     Lipid panel -     Comprehensive metabolic panel  Acquired hypothyroidism-  Thyroid stable Will monitor tsh Continue current dose of levothyroxine Discussed that any changes would require a earlier check Discussed that thyroid medication should always be taken on an empty stomach -     TSH  Arthritis- discussed mobic -     meloxicam (MOBIC) 7.5 MG tablet; Take 1 tablet (7.5mg ) by mouth daily as needed with food. No other NSAIDs.     [place]

## 2018-04-04 ENCOUNTER — Encounter: Payer: Self-pay | Admitting: Family Medicine

## 2018-04-04 ENCOUNTER — Ambulatory Visit: Payer: 59 | Admitting: Family Medicine

## 2018-04-04 VITALS — BP 124/77 | HR 74 | Temp 99.5°F | Resp 16 | Ht 73.0 in | Wt 191.6 lb

## 2018-04-04 DIAGNOSIS — Z1211 Encounter for screening for malignant neoplasm of colon: Secondary | ICD-10-CM

## 2018-04-04 DIAGNOSIS — K4090 Unilateral inguinal hernia, without obstruction or gangrene, not specified as recurrent: Secondary | ICD-10-CM | POA: Diagnosis not present

## 2018-04-04 DIAGNOSIS — E039 Hypothyroidism, unspecified: Secondary | ICD-10-CM | POA: Diagnosis not present

## 2018-04-04 DIAGNOSIS — E119 Type 2 diabetes mellitus without complications: Secondary | ICD-10-CM | POA: Diagnosis not present

## 2018-04-04 DIAGNOSIS — I10 Essential (primary) hypertension: Secondary | ICD-10-CM | POA: Diagnosis not present

## 2018-04-04 DIAGNOSIS — E785 Hyperlipidemia, unspecified: Secondary | ICD-10-CM

## 2018-04-04 DIAGNOSIS — M1990 Unspecified osteoarthritis, unspecified site: Secondary | ICD-10-CM

## 2018-04-04 MED ORDER — MELOXICAM 7.5 MG PO TABS: ORAL_TABLET | ORAL | 3 refills | Status: DC

## 2018-04-04 NOTE — Addendum Note (Signed)
Addended by: Delia Chimes A on: 04/04/2018 01:29 PM   Modules accepted: Orders

## 2018-04-04 NOTE — Patient Instructions (Signed)
Hernia, Adult A hernia is the bulging of an organ or tissue through a weak spot in the muscles of the abdomen (abdominal wall). Hernias develop most often near the navel or groin. There are many kinds of hernias. Common kinds include:  Femoral hernia. This kind of hernia develops under the groin in the upper thigh area.  Inguinal hernia. This kind of hernia develops in the groin or scrotum.  Umbilical hernia. This kind of hernia develops near the navel.  Hiatal hernia. This kind of hernia causes part of the stomach to be pushed up into the chest.  Incisional hernia. This kind of hernia bulges through a scar from an abdominal surgery.  What are the causes? This condition may be caused by:  Heavy lifting.  Coughing over a long period of time.  Straining to have a bowel movement.  An incision made during an abdominal surgery.  A birth defect (congenital defect).  Excess weight or obesity.  Smoking.  Poor nutrition.  Cystic fibrosis.  Excess fluid in the abdomen.  Undescended testicles.  What are the signs or symptoms? Symptoms of a hernia include:  A lump on the abdomen. This is the first sign of a hernia. The lump may become more obvious with standing, straining, or coughing. It may get bigger over time if it is not treated or if the condition causing it is not treated.  Pain. A hernia is usually painless, but it may become painful over time if treatment is delayed. The pain is usually dull and may get worse with standing or lifting heavy objects.  Sometimes a hernia gets tightly squeezed in the weak spot (strangulated) or stuck there (incarcerated) and causes additional symptoms. These symptoms may include:  Vomiting.  Nausea.  Constipation.  Irritability.  How is this diagnosed? A hernia may be diagnosed with:  A physical exam. During the exam your health care provider may ask you to cough or to make a specific movement, because a hernia is usually more  visible when you move.  Imaging tests. These can include: ? X-rays. ? Ultrasound. ? CT scan.  How is this treated? A hernia that is small and painless may not need to be treated. A hernia that is large or painful may be treated with surgery. Inguinal hernias may be treated with surgery to prevent incarceration or strangulation. Strangulated hernias are always treated with surgery, because lack of blood to the trapped organ or tissue can cause it to die. Surgery to treat a hernia involves pushing the bulge back into place and repairing the weak part of the abdomen. Follow these instructions at home:  Avoid straining.  Do not lift anything heavier than 10 lb (4.5 kg).  Lift with your leg muscles, not your back muscles. This helps avoid strain.  When coughing, try to cough gently.  Prevent constipation. Constipation leads to straining with bowel movements, which can make a hernia worse or cause a hernia repair to break down. You can prevent constipation by: ? Eating a high-fiber diet that includes plenty of fruits and vegetables. ? Drinking enough fluids to keep your urine clear or pale yellow. Aim to drink 6-8 glasses of water per day. ? Using a stool softener as directed by your health care provider.  Lose weight, if you are overweight.  Do not use any tobacco products, including cigarettes, chewing tobacco, or electronic cigarettes. If you need help quitting, ask your health care provider.  Keep all follow-up visits as directed by your health care   provider. This is important. Your health care provider may need to monitor your condition. Contact a health care provider if:  You have swelling, redness, and pain in the affected area.  Your bowel habits change. Get help right away if:  You have a fever.  You have abdominal pain that is getting worse.  You feel nauseous or you vomit.  You cannot push the hernia back in place by gently pressing on it while you are lying  down.  The hernia: ? Changes in shape or size. ? Is stuck outside the abdomen. ? Becomes discolored. ? Feels hard or tender. This information is not intended to replace advice given to you by your health care provider. Make sure you discuss any questions you have with your health care provider. Document Released: 10/05/2005 Document Revised: 03/04/2016 Document Reviewed: 08/15/2014 Elsevier Interactive Patient Education  2017 [employer].  Constipation, Adult Constipation is when a person has fewer bowel movements in a week than normal, has difficulty having a bowel movement, or has stools that are dry, hard, or larger than normal. Constipation may be caused by an underlying condition. It may become worse with age if a person takes certain medicines and does not take in enough fluids. Follow these instructions at home: Eating and drinking   Eat foods that have a lot of fiber, such as fresh fruits and vegetables, whole grains, and beans.  Limit foods that are high in fat, low in fiber, or overly processed, such as french fries, hamburgers, cookies, candies, and soda.  Drink enough fluid to keep your urine clear or pale yellow. General instructions  Exercise regularly or as told by your health care provider.  Go to the restroom when you have the urge to go. Do not hold it in.  Take over-the-counter and prescription medicines only as told by your health care provider. These include any fiber supplements.  Practice pelvic floor retraining exercises, such as deep breathing while relaxing the lower abdomen and pelvic floor relaxation during bowel movements.  Watch your condition for any changes.  Keep all follow-up visits as told by your health care provider. This is important. Contact a health care provider if:  You have pain that gets worse.  You have a fever.  You do not have a bowel movement after 4 days.  You vomit.  You are not hungry.  You lose weight.  You are  bleeding from the anus.  You have thin, pencil-like stools. Get help right away if:  You have a fever and your symptoms suddenly get worse.  You leak stool or have blood in your stool.  Your abdomen is bloated.  You have severe pain in your abdomen.  You feel dizzy or you faint. This information is not intended to replace advice given to you by your health care provider. Make sure you discuss any questions you have with your health care provider. Document Released: 07/03/2004 Document Revised: 04/24/2016 Document Reviewed: 03/25/2016 Elsevier Interactive Patient Education  2018 [employer].

## 2018-04-05 LAB — COMPREHENSIVE METABOLIC PANEL
ALK PHOS: 50 IU/L (ref 39–117)
ALT: 30 IU/L (ref 0–44)
AST: 24 IU/L (ref 0–40)
Albumin/Globulin Ratio: 1.5 (ref 1.2–2.2)
Albumin: 4.3 g/dL (ref 3.6–4.8)
BUN/Creatinine Ratio: 13 (ref 10–24)
BUN: 13 mg/dL (ref 8–27)
Bilirubin Total: 0.5 mg/dL (ref 0.0–1.2)
CALCIUM: 9.7 mg/dL (ref 8.6–10.2)
CHLORIDE: 102 mmol/L (ref 96–106)
CO2: 25 mmol/L (ref 20–29)
Creatinine, Ser: 1.04 mg/dL (ref 0.76–1.27)
GFR calc Af Amer: 85 mL/min/{1.73_m2} (ref >59)
GFR calc non Af Amer: 73 mL/min/{1.73_m2} (ref >59)
Globulin, Total: 2.8 g/dL (ref 1.5–4.5)
Glucose: 110 mg/dL — ABNORMAL HIGH (ref 65–99)
Potassium: 4.4 mmol/L (ref 3.5–5.2)
SODIUM: 140 mmol/L (ref 134–144)
Total Protein: 7.1 g/dL (ref 6.0–8.5)

## 2018-04-05 LAB — HEMOGLOBIN A1C
ESTIMATED AVERAGE GLUCOSE: 120 mg/dL
HEMOGLOBIN A1C: 5.8 % — AB (ref 4.8–5.6)

## 2018-04-05 LAB — LIPID PANEL
CHOL/HDL RATIO: 2.7 ratio (ref 0.0–5.0)
CHOLESTEROL TOTAL: 146 mg/dL (ref 100–199)
HDL: 54 mg/dL (ref >39)
LDL CALC: 81 mg/dL (ref 0–99)
TRIGLYCERIDES: 53 mg/dL (ref 0–149)
VLDL CHOLESTEROL CAL: 11 mg/dL (ref 5–40)

## 2018-04-05 LAB — TSH: TSH: 3.16 u[IU]/mL (ref 0.450–4.500)

## 2018-04-19 ENCOUNTER — Encounter: Payer: Self-pay | Admitting: Radiology

## 2018-06-03 ENCOUNTER — Telehealth: Payer: Self-pay | Admitting: Family Medicine

## 2018-06-03 NOTE — Telephone Encounter (Signed)
Left a voicemail in regards to his appt with Dr. Nolon Rod on 10/05/2018. The provider will not be in the office on that day and he needs to be rescheduled.

## 2018-07-02 ENCOUNTER — Other Ambulatory Visit: Payer: Self-pay | Admitting: Family Medicine

## 2018-07-04 NOTE — Telephone Encounter (Signed)
Levothyroxine 100 mcg  refill Last Refill:09/29/18 # 90 and one refill Last OV: 04/04/18 PCP: Burnett Harry [facility] # [phone]

## 2018-08-07 ENCOUNTER — Other Ambulatory Visit: Payer: Self-pay | Admitting: Internal Medicine

## 2018-10-05 ENCOUNTER — Encounter: Payer: 59 | Admitting: Family Medicine

## 2018-10-06 ENCOUNTER — Encounter: Payer: Self-pay | Admitting: Family Medicine

## 2018-10-06 ENCOUNTER — Other Ambulatory Visit: Payer: Self-pay

## 2018-10-06 ENCOUNTER — Ambulatory Visit (INDEPENDENT_AMBULATORY_CARE_PROVIDER_SITE_OTHER): Payer: 59 | Admitting: Family Medicine

## 2018-10-06 VITALS — BP 146/72 | HR 59 | Temp 98.6°F | Resp 17 | Ht 73.0 in | Wt 197.4 lb

## 2018-10-06 DIAGNOSIS — Z0001 Encounter for general adult medical examination with abnormal findings: Secondary | ICD-10-CM

## 2018-10-06 DIAGNOSIS — E1169 Type 2 diabetes mellitus with other specified complication: Secondary | ICD-10-CM

## 2018-10-06 DIAGNOSIS — Z0000 Encounter for general adult medical examination without abnormal findings: Secondary | ICD-10-CM

## 2018-10-06 DIAGNOSIS — I10 Essential (primary) hypertension: Secondary | ICD-10-CM

## 2018-10-06 DIAGNOSIS — E039 Hypothyroidism, unspecified: Secondary | ICD-10-CM

## 2018-10-06 DIAGNOSIS — R972 Elevated prostate specific antigen [PSA]: Secondary | ICD-10-CM | POA: Diagnosis not present

## 2018-10-06 MED ORDER — ASPIRIN 81 MG PO TABS: 81.0000 mg | ORAL_TABLET | Freq: Every day | ORAL | 3 refills | Status: AC

## 2018-10-06 MED ORDER — ATORVASTATIN CALCIUM 20 MG PO TABS: 20.0000 mg | ORAL_TABLET | Freq: Every day | ORAL | 3 refills | Status: DC

## 2018-10-06 MED ORDER — AMLODIPINE BESYLATE 5 MG PO TABS: 5.0000 mg | ORAL_TABLET | Freq: Every day | ORAL | 3 refills | Status: DC

## 2018-10-06 MED ORDER — MELOXICAM 7.5 MG PO TABS: ORAL_TABLET | ORAL | 3 refills | Status: DC

## 2018-10-06 MED ORDER — METFORMIN HCL 500 MG PO TABS: ORAL_TABLET | ORAL | 3 refills | Status: DC

## 2018-10-06 MED ORDER — LEVOTHYROXINE SODIUM 100 MCG PO TABS: 100.0000 ug | ORAL_TABLET | Freq: Every day | ORAL | 1 refills | Status: DC

## 2018-10-06 NOTE — Progress Notes (Signed)
Chief Complaint  Patient presents with  . Annual Exam    cpe  . Medication Refill    Subjective:  Jeffrey Guerrero is a 67 y.o. male here for a health maintenance visit.  Patient is established pt  Patient Active Problem List   Diagnosis Date Noted  . Hypothyroidism 10/05/2017  . Controlled type 2 diabetes mellitus without complication, without long-term current use of insulin ([place]) 04/07/2017  . Dyslipidemia 04/07/2017  . S/P pulmonic valve replacement with homograft 11/01/2014  . Preoperative cardiovascular examination   . Severe pulmonic insufficiency   . Adult hypothyroidism 06/12/2014  . Hyperglycemia 04/02/2014  . Pulmonic valve disease 04/02/2014  . Multiple skin nodules 04/02/2014  . Acute pulmonary embolism ([place]) 02/17/2014  . Other pulmonary embolism and infarction 02/17/2014  . Bacterial endocarditis - pulmonic valve vegetation with Streptococcus pneumoniae sepsis 02/16/2014  . Pulmonic valve insufficiency 02/16/2014  . Essential hypertension, benign 02/14/2014  . Leukocytopenia, unspecified 02/14/2014  . Gram-positive bacteremia 02/14/2014  . ARF (acute renal failure) ([place]) 02/14/2014  . HTN (hypertension) 02/14/2014  . Benign essential HTN 02/14/2014  . Septic shock ([place]) 02/12/2014    Past Medical History:  Diagnosis Date  . Arthritis    "knees" (02/13/2014)  . 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 Diabetes mellitus without complication ([place])    takes metformin  . Hypertension   . Hypothyroidism   . Pulmonic valve insufficiency 02/16/2014   severe  . S/P pulmonic valve replacement with homograft 11/01/2014  . Thyroid disease     Past Surgical History:  Procedure Laterality Date  . AORTIC VALVE REPLACEMENT [name] 11/01/2014   Procedure: PULMONIC HOMOGRAFT VALVE REPLACEMENT ;   Surgeon: Rexene Alberts, MD;  Location: [place];  Service: Open Heart Surgery;  Laterality: [name];  . APPENDECTOMY    . COLONOSCOPY    . COLONOSCOPY WITH PROPOFOL [name] 11/05/2017   Procedure: COLONOSCOPY WITH PROPOFOL;  Surgeon: [name], MD;  Location[facility] ENDOSCOPY;  Service: Gastroenterology;  Laterality: [name];  . LEFT HEART CATHETERIZATION WITH CORONARY ANGIOGRAM [name] 10/09/2014   Procedure: LEFT HEART CATHETERIZATION WITH CORONARY ANGIOGRAM;  Surgeon: Jettie Booze, MD;  Location: [facility] CATH LAB;  Service: Cardiovascular;  Laterality: [name];  . TEE WITHOUT CARDIOVERSION [name] 02/16/2014   Procedure: TRANSESOPHAGEAL ECHOCARDIOGRAM (TEE);  Surgeon: [name], MD;  Location: [facility] ENDOSCOPY;  Service: Cardiovascular;  Laterality: [name];  . TEE WITHOUT CARDIOVERSION [name] 04/12/2014   Procedure: TRANSESOPHAGEAL ECHOCARDIOGRAM (TEE);  Surgeon: [name], MD;  Location: [place];  Service: Cardiovascular;  Laterality: [name];  . TEE WITHOUT CARDIOVERSION [name] 11/01/2014   Procedure: TRANSESOPHAGEAL ECHOCARDIOGRAM (TEE);  Surgeon: Rexene Alberts, MD;  Location: [place];  Service: Open Heart Surgery;  Laterality: [name];  . TONSILLECTOMY       Outpatient Medications Prior to Visit  Medication Sig Dispense Refill  . amLODipine (NORVASC) 5 MG tablet Take 1 tablet (5 mg total) by mouth daily. 90 tablet 3  . aspirin 81 MG tablet Take 81 mg by mouth daily.    Marland Kitchen atorvastatin (LIPITOR) 20 MG tablet TAKE 1 TABLET BY MOUTH EVERY DAY 30 tablet 5  . levothyroxine (SYNTHROID, LEVOTHROID) 100 MCG tablet TAKE 1 TABLET (100 MCG TOTAL) BY MOUTH DAILY BEFORE BREAKFAST. 90 tablet 1  . meloxicam (MOBIC) 7.5 MG tablet Take 1 tablet (7.'5mg'$ ) by mouth daily as needed with  food. No other NSAIDs. 30 tablet 3  . metFORMIN (GLUCOPHAGE) 500 MG tablet TAKE ONE TABLET (500 MG TOTAL) BY MOUTH 2 (TWO) TIMES DAILY WITH MEALS. 180 tablet 3   No facility-administered medications prior to visit.     No Known Allergies   Family History   Problem Relation Age of Onset  . Heart disease Neg Hx      Health Habits: Dental Exam: up to date Eye Exam: up to date Exercise: 5 times/week on average Current exercise activities: walking/running Diet:   Social History   Socioeconomic History  . Marital status: Married    Spouse name: Not on file  . Number of children: Not on file  . Years of education: Not on file  . Highest education level: Not on file  Occupational History  . Occupation: Archivist  . Financial resource strain: Not on file  . Food insecurity:    Worry: Not on file    Inability: Not on file  . Transportation needs:    Medical: Not on file    Non-medical: Not on file  Tobacco Use  . Smoking status: Never Smoker  . Smokeless tobacco: Never Used  Substance and Sexual Activity  . Alcohol use: Yes    Comment: occasional  . Drug use: No  . Sexual activity: Yes  Lifestyle  . Physical activity:    Days per week: Not on file    Minutes per session: Not on file  . Stress: Not on file  Relationships  . Social connections:    Talks on phone: Not on file    Gets together: Not on file    Attends religious service: Not on file    Active member of club or organization: Not on file    Attends meetings of clubs or organizations: Not on file    Relationship status: Not on file  . Intimate partner violence:    Fear of current or ex partner: Not on file    Emotionally abused: Not on file    Physically abused: Not on file    Forced sexual activity: Not on file  Other Topics Concern  . Not on file  Social History Narrative   Married   Social History   Substance and Sexual Activity  Alcohol Use Yes   Comment: occasional   Social History   Tobacco Use  Smoking Status Never Smoker  Smokeless Tobacco Never Used   Social History   Substance and Sexual Activity  Drug Use No     Health Maintenance: See under health Maintenance activity for review of completion dates as  well. Immunization History  Administered Date(s) Administered  . Influenza Split 07/28/2013  . Influenza-Unspecified 07/19/2015, 07/18/2017  . Pneumococcal Conjugate-13 09/18/2015  . Pneumococcal Polysaccharide-23 11/16/2015  . Tdap 09/23/2016      Depression Screen-PHQ2/9 Depression screen [facility] 2/9 10/06/2018 04/04/2018 09/29/2017 04/07/2017 09/23/2016  Decreased Interest 0 0 0 0 0  Down, Depressed, Hopeless 0 0 0 0 0  PHQ - 2 Score 0 0 0 0 0       Depression Severity and Treatment Recommendations:  0-4= None  5-9= Mild / Treatment: Support, educate to call if worse; return in one month  10-14= Moderate / Treatment: Support, watchful waiting; Antidepressant or Psycotherapy  15-19= Moderately severe / Treatment: Antidepressant OR Psychotherapy  >= 20 = Major depression, severe / Antidepressant AND Psychotherapy    Review of Systems   ROS  See HPI for ROS as well.  Review of Systems  Constitutional: Negative for activity change, appetite change, chills and fever.  HENT: Negative for congestion, nosebleeds, trouble swallowing and voice change.   Respiratory: Negative for cough, shortness of breath and wheezing.   Gastrointestinal: Negative for diarrhea, nausea and vomiting.  Genitourinary: Negative for difficulty urinating, dysuria, flank pain and hematuria.  Musculoskeletal: Negative for back pain, joint swelling and neck pain.  Neurological: Negative for dizziness, speech difficulty, light-headedness and numbness.  See HPI. All other review of systems negative.    Objective:   Vitals:   10/06/18 0916 10/06/18 0923  BP: (!) 165/90 (!) 146/72  Pulse: (!) 59   Resp: 17   Temp: 98.6 F (37 C)   TempSrc: Oral   SpO2: 95%   Weight: 197 lb 6.4 oz (89.5 kg)   Height: '6\' 1"'$  (1.854 m)     Body mass index is 26.04 kg/m.  Physical Exam  BP (!) 146/72 (BP Location: Left Arm, Patient Position: Sitting, Cuff Size: Large)   Pulse (!) 59   Temp 98.6 F (37 C) (Oral)    Resp 17   Ht '6\' 1"'$  (1.854 m)   Wt 197 lb 6.4 oz (89.5 kg)   SpO2 95%   BMI 26.04 kg/m   General Appearance:    Alert, cooperative, no distress, appears stated age  Head:    Normocephalic, without obvious abnormality, atraumatic  Eyes:    PERRL, conjunctiva/corneas clear, EOM's intact, fundi    benign, both eyes       Ears:    Normal TM's and external ear canals, both ears  Nose:   Nares normal, septum midline, mucosa normal, no drainage   or sinus tenderness  Throat:   Lips, mucosa, and tongue normal; teeth and gums normal  Neck:   Supple, symmetrical, trachea midline, no adenopathy;       thyroid:  No enlargement/tenderness/nodules; no carotid   bruit or JVD  Back:     Symmetric, no curvature, ROM normal, no CVA tenderness  Lungs:     Clear to auscultation bilaterally, respirations unlabored  Chest wall:    No tenderness or deformity  Heart:    Regular rate and rhythm, S1 and S2 normal, + murmur  Abdomen:     Soft, non-tender, bowel sounds active all four quadrants,    no masses, no organomegaly  Genitalia:    Normal male without lesion, discharge or tenderness  Rectal:    Chaperon present. Normal tone, normal prostate, no masses or tenderness  Extremities:   Extremities normal, atraumatic, no cyanosis or edema  Pulses:   2+ and symmetric all extremities  Skin:   Skin color, texture, turgor normal, no rashes or lesions  Lymph nodes:   Cervical, supraclavicular, and axillary nodes normal  Neurologic:   CNII-XII intact. Normal strength, sensation and reflexes      throughout      Assessment/Plan:   Patient was seen for a health maintenance exam.  Counseled the patient on health maintenance issues. Reviewed her health mainteance schedule and ordered appropriate tests (see orders.) Counseled on regular exercise and weight management. Recommend regular eye exams and dental cleaning.   The following issues were addressed today for health maintenance:   Mohammed was seen today  for annual exam and medication refill.  Diagnoses and all orders for this visit:  Health maintenance examination-  Discussed age appropriate screenings  Type 2 diabetes mellitus with other specified complication, without long-term current use of insulin ([place])- diabetes very well controlled  -  Microalbumin, urine -     HM Diabetes Foot Exam -     Ambulatory referral to Ophthalmology -     Lipid panel -     Hemoglobin A1c -     CMP14+EGFR  Essential hypertension, benign-  bp typically at goal, discussed DASH diet  Elevated PSA - will get records from [facility] -     PSA  Acquired hypothyroidism- discussed tsh -     TSH    No follow-ups on file.    Body mass index is 26.04 kg/m.:  Discussed the patient's BMI with patient. The BMI body mass index is 26.04 kg/m.     No future appointments.  Patient Instructions       If you have lab work done today you will be contacted with your lab results within the next 2 weeks.  If you have not heard from [place] then please contact us. The fastest way to get your results is to register for My Chart.   IF you received an x-ray today, you will receive an invoice from [facility]. Please contact [facility] at [phone] with questions or concerns regarding your invoice.   IF you received labwork today, you will receive an invoice from [place]. Please contact [facility] at [phone] with questions or concerns regarding your invoice.   Our billing staff will not be able to assist you with questions regarding bills from these companies.  You will be contacted with the lab results as soon as they are available. The fastest way to get your results is to activate your My Chart account. Instructions are located on the last page of this paperwork. If you have not heard from [place] regarding the results in 2 weeks, please contact this office.

## 2018-10-06 NOTE — Patient Instructions (Signed)
° ° ° °  If you have lab work done today you will be contacted with your lab results within the next 2 weeks.  If you have not heard from us then please contact us. The fastest way to get your results is to register for My Chart. ° ° °IF you received an x-ray today, you will receive an invoice from [facility]. Please contact [facility] at [phone] with questions or concerns regarding your invoice.  ° °IF you received labwork today, you will receive an invoice from [facility]. Please contact [facility] at [phone] with questions or concerns regarding your invoice.  ° °Our billing staff will not be able to assist you with questions regarding bills from these companies. ° °You will be contacted with the lab results as soon as they are available. The fastest way to get your results is to activate your My Chart account. Instructions are located on the last page of this paperwork. If you have not heard from us regarding the results in 2 weeks, please contact this office. °  ° ° ° °

## 2018-10-07 LAB — CMP14+EGFR
A/G RATIO: 1.6 (ref 1.2–2.2)
ALBUMIN: 4.3 g/dL (ref 3.6–4.8)
ALK PHOS: 55 IU/L (ref 39–117)
ALT: 33 IU/L (ref 0–44)
AST: 28 IU/L (ref 0–40)
BILIRUBIN TOTAL: 0.5 mg/dL (ref 0.0–1.2)
BUN / CREAT RATIO: 14 (ref 10–24)
BUN: 15 mg/dL (ref 8–27)
CHLORIDE: 103 mmol/L (ref 96–106)
CO2: 26 mmol/L (ref 20–29)
CREATININE: 1.08 mg/dL (ref 0.76–1.27)
Calcium: 9.7 mg/dL (ref 8.6–10.2)
GFR calc Af Amer: 81 mL/min/{1.73_m2} (ref >59)
GFR calc non Af Amer: 70 mL/min/{1.73_m2} (ref >59)
GLOBULIN, TOTAL: 2.7 g/dL (ref 1.5–4.5)
Glucose: 104 mg/dL — ABNORMAL HIGH (ref 65–99)
POTASSIUM: 4.1 mmol/L (ref 3.5–5.2)
SODIUM: 142 mmol/L (ref 134–144)
Total Protein: 7 g/dL (ref 6.0–8.5)

## 2018-10-07 LAB — LIPID PANEL
CHOL/HDL RATIO: 2.9 ratio (ref 0.0–5.0)
Cholesterol, Total: 160 mg/dL (ref 100–199)
HDL: 55 mg/dL (ref >39)
LDL CALC: 93 mg/dL (ref 0–99)
TRIGLYCERIDES: 61 mg/dL (ref 0–149)
VLDL CHOLESTEROL CAL: 12 mg/dL (ref 5–40)

## 2018-10-07 LAB — PSA: Prostate Specific Ag, Serum: 1.1 ng/mL (ref 0.0–4.0)

## 2018-10-07 LAB — HEMOGLOBIN A1C
Est. average glucose Bld gHb Est-mCnc: 126 mg/dL
Hgb A1c MFr Bld: 6 % — ABNORMAL HIGH (ref 4.8–5.6)

## 2018-10-07 LAB — TSH: TSH: 3.06 u[IU]/mL (ref 0.450–4.500)

## 2018-10-07 LAB — MICROALBUMIN, URINE

## 2019-02-13 ENCOUNTER — Ambulatory Visit: Payer: 59 | Admitting: Internal Medicine

## 2019-03-30 ENCOUNTER — Telehealth: Payer: Self-pay | Admitting: Family Medicine

## 2019-03-30 NOTE — Telephone Encounter (Signed)
Called pt to reschedule appt, no vm box

## 2019-04-07 ENCOUNTER — Telehealth: Payer: Self-pay | Admitting: Family Medicine

## 2019-04-07 NOTE — Telephone Encounter (Signed)
error 

## 2019-04-10 ENCOUNTER — Ambulatory Visit: Payer: 59 | Admitting: Family Medicine

## 2019-04-14 ENCOUNTER — Ambulatory Visit: Payer: 59 | Admitting: Family Medicine

## 2019-04-19 ENCOUNTER — Ambulatory Visit: Payer: 59 | Admitting: Family Medicine

## 2019-04-25 ENCOUNTER — Other Ambulatory Visit: Payer: Self-pay

## 2019-04-25 ENCOUNTER — Encounter: Payer: Self-pay | Admitting: Family Medicine

## 2019-04-25 ENCOUNTER — Ambulatory Visit: Payer: 59 | Admitting: Family Medicine

## 2019-04-25 VITALS — BP 152/84 | HR 64 | Temp 98.7°F | Ht 72.0 in | Wt 195.4 lb

## 2019-04-25 DIAGNOSIS — I10 Essential (primary) hypertension: Secondary | ICD-10-CM

## 2019-04-25 DIAGNOSIS — G63 Polyneuropathy in diseases classified elsewhere: Secondary | ICD-10-CM

## 2019-04-25 DIAGNOSIS — G5691 Unspecified mononeuropathy of right upper limb: Secondary | ICD-10-CM | POA: Diagnosis not present

## 2019-04-25 DIAGNOSIS — E1169 Type 2 diabetes mellitus with other specified complication: Secondary | ICD-10-CM | POA: Diagnosis not present

## 2019-04-25 DIAGNOSIS — E119 Type 2 diabetes mellitus without complications: Secondary | ICD-10-CM | POA: Insufficient documentation

## 2019-04-25 NOTE — Patient Instructions (Signed)
° ° ° °  If you have lab work done today you will be contacted with your lab results within the next 2 weeks.  If you have not heard from us then please contact us. The fastest way to get your results is to register for My Chart. ° ° °IF you received an x-ray today, you will receive an invoice from [facility]. Please contact [facility] at [phone] with questions or concerns regarding your invoice.  ° °IF you received labwork today, you will receive an invoice from [facility]. Please contact [facility] at [phone] with questions or concerns regarding your invoice.  ° °Our billing staff will not be able to assist you with questions regarding bills from these companies. ° °You will be contacted with the lab results as soon as they are available. The fastest way to get your results is to activate your My Chart account. Instructions are located on the last page of this paperwork. If you have not heard from us regarding the results in 2 weeks, please contact this office. °  ° ° ° °

## 2019-04-25 NOTE — Progress Notes (Signed)
Acute Office Visit  Subjective:    Patient ID: Jeffrey Guerrero, male    DOB: 12-Dec-1950, 68 y.o.   MRN: [id number]  Chief Complaint  Patient presents with  . Diabetes  . Hypertension    f/u     HPI Patient is in today for DM-glucose not checked at home-A1c 12/19 6.0%-metformin only Pt does not see podiatry-in the past pedicure for thickened nails-has tingling in fingers and toes Wears shoes and had a recent eye exam  HTN-takes meds daily, sees cardio yearly-appt in August-had valve replacement-renal function normal 12/19, microalbumin normal  Hyperlipidemia-takes atorvastatin daily-LFT and lipid 12/19 normal  Hypothyroid-TSH 12/19 normal-synthroid daily   Past Medical History:  Diagnosis Date  . Arthritis    "knees" (02/13/2014)  . 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 Diabetes mellitus without complication ([place])    takes metformin  . Hypertension   . Hypothyroidism   . Pulmonic valve insufficiency 02/16/2014   severe  . S/P pulmonic valve replacement with homograft 11/01/2014  . Thyroid disease     Past Surgical History:  Procedure Laterality Date  . AORTIC VALVE REPLACEMENT [name] 11/01/2014   Procedure: PULMONIC HOMOGRAFT VALVE REPLACEMENT ;  Surgeon: Rexene Alberts, MD;  Location: [place];  Service: Open Heart Surgery;  Laterality: [name];  . APPENDECTOMY    . COLONOSCOPY    . COLONOSCOPY WITH PROPOFOL [name] 11/05/2017   Procedure: COLONOSCOPY WITH PROPOFOL;  Surgeon: [name], MD;  Location: [facility] ENDOSCOPY;  Service: Gastroenterology;  Laterality: [name];  . LEFT HEART CATHETERIZATION WITH CORONARY ANGIOGRAM [name] 10/09/2014   Procedure: LEFT HEART CATHETERIZATION WITH CORONARY ANGIOGRAM;  Surgeon: Jettie Booze, MD;  Location: [facility] CATH LAB;  Service: Cardiovascular;  Laterality: [name];  .  TEE WITHOUT CARDIOVERSION [name] 02/16/2014   Procedure: TRANSESOPHAGEAL ECHOCARDIOGRAM (TEE);  Surgeon: [name], MD;  Location: [facility] ENDOSCOPY;  Service: Cardiovascular;  Laterality: [name];  . TEE WITHOUT CARDIOVERSION [name] 04/12/2014   Procedure: TRANSESOPHAGEAL ECHOCARDIOGRAM (TEE);  Surgeon: [name], MD;  Location: [place];  Service: Cardiovascular;  Laterality: [name];  . TEE WITHOUT CARDIOVERSION [name] 11/01/2014   Procedure: TRANSESOPHAGEAL ECHOCARDIOGRAM (TEE);  Surgeon: Rexene Alberts, MD;  Location: [place];  Service: Open Heart Surgery;  Laterality: [name];  . TONSILLECTOMY      Family History  Problem Relation Age of Onset  . Heart disease Neg Hx     Social History   Socioeconomic History  . Marital status: Married    Spouse name: Not on file  . Number of children: Not on file  . Years of education: Not on file  . Highest education level: Not on file  Occupational History  . Occupation: Archivist  . Financial resource strain: Not on file  . Food insecurity    Worry: Not on file    Inability: Not on file  . Transportation needs    Medical: Not on file    Non-medical: Not on file  Tobacco Use  . Smoking status: Never Smoker  . Smokeless tobacco: Never Used  Substance and Sexual Activity  . Alcohol use: Yes    Comment: occasional  . Drug use: No  . Sexual activity: Yes  Lifestyle  . Physical activity    Days per week: Not on file    Minutes per session: Not on file  .  Stress: Not on file  Relationships  . Social Herbalist on phone: Not on file    Gets together: Not on file    Attends religious service: Not on file    Active member of club or organization: Not on file    Attends meetings of clubs or organizations: Not on file    Relationship status: Not on file  . Intimate partner violence    Fear of current or ex partner: Not on file    Emotionally abused: Not on file    Physically abused: Not on file    Forced sexual activity: Not on  file  Other Topics Concern  . Not on file  Social History Narrative   Married    Outpatient Medications Prior to Visit  Medication Sig Dispense Refill  . amLODipine (NORVASC) 5 MG tablet Take 1 tablet (5 mg total) by mouth daily. 90 tablet 3  . aspirin 81 MG tablet Take 1 tablet (81 mg total) by mouth daily. 90 tablet 3  . atorvastatin (LIPITOR) 20 MG tablet Take 1 tablet (20 mg total) by mouth daily. 90 tablet 3  . levothyroxine (SYNTHROID, LEVOTHROID) 100 MCG tablet Take 1 tablet (100 mcg total) by mouth daily before breakfast. 90 tablet 1  . meloxicam (MOBIC) 7.5 MG tablet Take 1 tablet (7.5mg ) by mouth daily as needed with food. No other NSAIDs. 30 tablet 3  . metFORMIN (GLUCOPHAGE) 500 MG tablet TAKE ONE TABLET (500 MG TOTAL) BY MOUTH 2 (TWO) TIMES DAILY WITH MEALS. 180 tablet 3  . Multiple Vitamin (MULTIVITAMIN) tablet Take by mouth.     No facility-administered medications prior to visit.     No Known Allergies  Review of Systems  Eyes: Negative for blurred vision and double vision.  Respiratory: Negative for shortness of breath.   Cardiovascular: Negative for chest pain and leg swelling.  Gastrointestinal: Negative for heartburn and nausea.  Neurological: Negative for dizziness.       Objective:    Physical Exam  Constitutional: He appears well-developed and well-nourished. No distress.  Eyes: Conjunctivae are normal.  Cardiovascular: Normal rate and regular rhythm.  Murmur heard. Pulmonary/Chest: Effort normal and breath sounds normal.  Neurological: He is alert.  diabetic foot exam-thickened nails bilat, no ulcerations, monofilament normal, calluses noted bilat  BP (!) 152/84 (BP Location: Right Arm, Patient Position: Sitting, Cuff Size: Large)   Pulse 64   Temp 98.7 F (37.1 C) (Oral)   Ht 6' (1.829 m)   Wt 195 lb 6.4 oz (88.6 kg)   SpO2 96%   BMI 26.50 kg/m  Wt Readings from Last 3 Encounters:  04/25/19 195 lb 6.4 oz (88.6 kg)  10/06/18 197 lb 6.4 oz  (89.5 kg)  04/04/18 191 lb 9.6 oz (86.9 kg)    Health Maintenance Due  Topic Date Due  . OPHTHALMOLOGY EXAM  07/02/1959  . HEMOGLOBIN A1C  04/07/2019     Lab Results  Component Value Date   TSH 3.060 10/06/2018   Lab Results  Component Value Date   WBC 3.3 (L) 09/18/2015   HGB 14.0 09/18/2015   HCT 42.2 09/18/2015   MCV 89.6 09/18/2015   PLT 204 09/18/2015   Lab Results  Component Value Date   NA 142 10/06/2018   K 4.1 10/06/2018   CO2 26 10/06/2018   GLUCOSE 104 (H) 10/06/2018   BUN 15 10/06/2018   CREATININE 1.08 10/06/2018   BILITOT 0.5 10/06/2018   ALKPHOS 55 10/06/2018   AST  28 10/06/2018   ALT 33 10/06/2018   PROT 7.0 10/06/2018   ALBUMIN 4.3 10/06/2018   CALCIUM 9.7 10/06/2018   ANIONGAP 8 11/03/2014   GFR 104.74 11/21/2014   Lab Results  Component Value Date   CHOL 160 10/06/2018   Lab Results  Component Value Date   HDL 55 10/06/2018   Lab Results  Component Value Date   LDLCALC 93 10/06/2018   Lab Results  Component Value Date   TRIG 61 10/06/2018   Lab Results  Component Value Date   CHOLHDL 2.9 10/06/2018   Lab Results  Component Value Date   HGBA1C 6.0 (H) 10/06/2018   1. Essential hypertension, benign - Basic Metabolic Panel - Hemoglobin A1c Stable on amlodipine 2. Neuropathy of finger of right hand - Vitamin B12 Pt states noted after medications given in the hospital for heart disease-pt with pulmonary emboli and sepsis 3. Polyneuropathy associated with underlying disease ([place]) Peripheral neuropathy-intermittently-foot exam-thickened nails - Ambulatory referral to Podiatry - Hemoglobin A1c  4. Type 2 diabetes mellitus with other specified complication, without long-term current use of insulin (HCC) Metformin-pt does not take glucose readings - Hemoglobin A1c    Assessment & Plan:   LISA Hannah Beat, MD

## 2019-04-26 LAB — BASIC METABOLIC PANEL
BUN/Creatinine Ratio: 19 (ref 10–24)
BUN: 19 mg/dL (ref 8–27)
CO2: 22 mmol/L (ref 20–29)
Calcium: 9.6 mg/dL (ref 8.6–10.2)
Chloride: 108 mmol/L — ABNORMAL HIGH (ref 96–106)
Creatinine, Ser: 1.01 mg/dL (ref 0.76–1.27)
GFR calc Af Amer: 87 mL/min/{1.73_m2} (ref >59)
GFR calc non Af Amer: 76 mL/min/{1.73_m2} (ref >59)
Glucose: 83 mg/dL (ref 65–99)
Potassium: 4.1 mmol/L (ref 3.5–5.2)
Sodium: 146 mmol/L — ABNORMAL HIGH (ref 134–144)

## 2019-04-26 LAB — HEMOGLOBIN A1C
Est. average glucose Bld gHb Est-mCnc: 123 mg/dL
Hgb A1c MFr Bld: 5.9 % — ABNORMAL HIGH (ref 4.8–5.6)

## 2019-04-26 LAB — VITAMIN B12: Vitamin B-12: 588 pg/mL (ref 232–1245)

## 2019-05-03 ENCOUNTER — Other Ambulatory Visit: Payer: Self-pay

## 2019-05-03 ENCOUNTER — Encounter: Payer: Self-pay | Admitting: Podiatry

## 2019-05-03 ENCOUNTER — Ambulatory Visit: Payer: 59 | Admitting: Podiatry

## 2019-05-03 ENCOUNTER — Ambulatory Visit: Payer: 59

## 2019-05-03 VITALS — BP 161/84 | HR 60 | Temp 98.4°F

## 2019-05-03 DIAGNOSIS — E0843 Diabetes mellitus due to underlying condition with diabetic autonomic (poly)neuropathy: Secondary | ICD-10-CM | POA: Diagnosis not present

## 2019-05-03 DIAGNOSIS — L989 Disorder of the skin and subcutaneous tissue, unspecified: Secondary | ICD-10-CM

## 2019-05-03 DIAGNOSIS — B351 Tinea unguium: Secondary | ICD-10-CM

## 2019-05-03 DIAGNOSIS — M79676 Pain in unspecified toe(s): Secondary | ICD-10-CM

## 2019-05-03 DIAGNOSIS — G629 Polyneuropathy, unspecified: Secondary | ICD-10-CM

## 2019-05-05 NOTE — Progress Notes (Signed)
    Subjective: Patient is a 68 y.o. male with PMHx of T2DM presenting to the office today as a new patient with a chief complaint of painful callus lesions noted to the bilateral feet that have been present for the past several months. Walking and bearing weight increases the pain. He has not had any treatment for the symptoms.  Patient also complains of elongated, thickened nails that cause pain while ambulating in shoes. He is unable to trim his own nails. Patient presents today for further treatment and evaluation.  Past Medical History:  Diagnosis Date  . Arthritis    "knees" (02/13/2014)  . 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 Diabetes mellitus without complication ([place])    takes metformin  . Hypertension   . Hypothyroidism   . Pulmonic valve insufficiency 02/16/2014   severe  . S/P pulmonic valve replacement with homograft 11/01/2014  . Thyroid disease     Objective:  Physical Exam General: Alert and oriented x3 in no acute distress  Dermatology: Hyperkeratotic lesions present on the bilateral feet x 2. Pain on palpation with a central nucleated core noted. Skin is warm, dry and supple bilateral lower extremities. Negative for open lesions or macerations. Nails are tender, long, thickened and dystrophic with subungual debris, consistent with onychomycosis, 1-5 bilateral. No signs of infection noted.  Vascular: Palpable pedal pulses bilaterally. No edema or erythema noted. Capillary refill within normal limits.  Neurological: Epicritic and protective threshold diminished bilaterally.   Musculoskeletal Exam: Pain on palpation at the keratotic lesion noted. Range of motion within normal limits bilateral. Muscle strength 5/5 in all groups bilateral.  Assessment: 1. Onychodystrophic nails 1-5  bilateral with hyperkeratosis of nails.  2. Onychomycosis of nail due to dermatophyte bilateral 3. Pre-ulcerative callus lesions noted to the bilateral feet x 2   Plan of Care:  1. Patient evaluated. 2. Excisional debridement of keratoic lesion using a chisel blade was performed without incident.  3. Dressed with light dressing. 4. Mechanical debridement of nails 1-5 bilaterally performed using a nail nipper. Filed with dremel without incident.  5. Recommended Urea 40% cream to feet daily.  6. Patient is to return to the clinic annually.   Edrick Kins, DPM [facility]  Dr. Edrick Kins, [place]                                        [place], [place] [address]                Office [phone]  Fax [phone]

## 2019-05-10 NOTE — Progress Notes (Signed)
Spoke with wife she was informed of patients lab results.

## 2019-05-19 ENCOUNTER — Telehealth: Payer: Self-pay

## 2019-05-19 NOTE — Telephone Encounter (Signed)
NO answer when called to go over covid 19 screening questions.

## 2019-05-22 ENCOUNTER — Other Ambulatory Visit: Payer: Self-pay

## 2019-05-22 ENCOUNTER — Encounter: Payer: Self-pay | Admitting: Internal Medicine

## 2019-05-22 ENCOUNTER — Ambulatory Visit: Payer: 59 | Admitting: Internal Medicine

## 2019-05-22 VITALS — BP 144/82 | HR 63 | Ht 72.0 in | Wt 203.2 lb

## 2019-05-22 DIAGNOSIS — E785 Hyperlipidemia, unspecified: Secondary | ICD-10-CM | POA: Diagnosis not present

## 2019-05-22 MED ORDER — AMLODIPINE BESYLATE 10 MG PO TABS: 10.0000 mg | ORAL_TABLET | Freq: Every day | ORAL | 3 refills | Status: DC

## 2019-05-22 NOTE — Patient Instructions (Addendum)
Medication Instructions:  1) INCREASE Amlodipine to 10mg  once daily. Dr. [name] said to start by take 5mg  twice a daily. If you need a refill on your cardiac medications before your next appointment, please call your pharmacy.   Lab work: Lipid today If you have labs (blood work) drawn today and your tests are completely normal, you will receive your results only by: Marland Kitchen MyChart Message (if you have MyChart) OR . A paper copy in the mail If you have any lab test that is abnormal or we need to change your treatment, we will call you to review the results.  Testing/Procedures: None  Follow-Up: At [facility], you and your health needs are our priority.  As part of our continuing mission to provide you with exceptional heart care, we have created designated Provider Care Teams.  These Care Teams include your primary Cardiologist (physician) and Advanced Practice Providers (APPs -  Physician Assistants and Nurse Practitioners) who all work together to provide you with the care you need, when you need it. You will need a follow up appointment in:  4-6 weeks with a televisit.  Please call our office 2 months in advance to schedule this appointment.  You may see Dr. [name] or one of the following Advanced Practice Providers on your designated Care Team: Richardson Dopp, PA-C [place], [place] . Daune Perch, NP  Any Other Special Instructions Will Be Listed Below (If Applicable).

## 2019-05-22 NOTE — Progress Notes (Signed)
Cardiology Office Note   Date:  05/22/2019   ID:  Jeffrey Guerrero, DOB Aug 23, 1951, MRN [id number]  PCP:  Forrest Moron, MD  Cardiologist:   Dorris Carnes, MD   F/U of PV dz      History of Present Illness: Jeffrey Guerrero is a 68 y.o. male with a history of pulmonic valve endocarditis  Pulmonary emboli and septsis  I saw him in March 2016  He underwent PVR on 11/01/14  Echo showed peak gradient acrosss prosthesis of 18 mm Hg   I saw the pt in March 2018 Since seen he says his breating is OK   He denies CP   No dizziness  No fevers      Current Meds  Medication Sig  . amLODipine (NORVASC) 5 MG tablet Take 1 tablet (5 mg total) by mouth daily.  Marland Kitchen aspirin 81 MG tablet Take 1 tablet (81 mg total) by mouth daily.  Marland Kitchen atorvastatin (LIPITOR) 20 MG tablet Take 1 tablet (20 mg total) by mouth daily.  Marland Kitchen levothyroxine (SYNTHROID, LEVOTHROID) 100 MCG tablet Take 1 tablet (100 mcg total) by mouth daily before breakfast.  . meloxicam (MOBIC) 7.5 MG tablet Take 1 tablet (7.5mg ) by mouth daily as needed with food. No other NSAIDs.  . metFORMIN (GLUCOPHAGE) 500 MG tablet TAKE ONE TABLET (500 MG TOTAL) BY MOUTH 2 (TWO) TIMES DAILY WITH MEALS.  . Multiple Vitamin (MULTIVITAMIN) tablet Take by mouth.     Allergies:   Patient has no known allergies.   Past Medical History:  Diagnosis Date  . Arthritis    "knees" (02/13/2014)  . 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 Diabetes mellitus without complication ([place])    takes metformin  . Hypertension   . Hypothyroidism   . Pulmonic valve insufficiency 02/16/2014   severe  . S/P pulmonic valve replacement with homograft 11/01/2014  . Thyroid disease     Past Surgical History:  Procedure Laterality Date  . AORTIC VALVE REPLACEMENT [name] 11/01/2014   Procedure: PULMONIC  HOMOGRAFT VALVE REPLACEMENT ;  Surgeon: Rexene Alberts, MD;  Location: [place];  Service: Open Heart Surgery;  Laterality: [name];  . APPENDECTOMY    . COLONOSCOPY    . COLONOSCOPY WITH PROPOFOL [name] 11/05/2017   Procedure: COLONOSCOPY WITH PROPOFOL;  Surgeon: [name], MD;  Location[facility] ENDOSCOPY;  Service: Gastroenterology;  Laterality: [name];  . LEFT HEART CATHETERIZATION WITH CORONARY ANGIOGRAM [name] 10/09/2014   Procedure: LEFT HEART CATHETERIZATION WITH CORONARY ANGIOGRAM;  Surgeon: Jettie Booze, MD;  Location: [facility] CATH LAB;  Service: Cardiovascular;  Laterality: [name];  . TEE WITHOUT CARDIOVERSION [name] 02/16/2014   Procedure: TRANSESOPHAGEAL ECHOCARDIOGRAM (TEE);  Surgeon: [name], MD;  Location: [facility] ENDOSCOPY;  Service: Cardiovascular;  Laterality: [name];  . TEE WITHOUT CARDIOVERSION [name] 04/12/2014   Procedure: TRANSESOPHAGEAL ECHOCARDIOGRAM (TEE);  Surgeon: [name], MD;  Location: [place];  Service: Cardiovascular;  Laterality: [name];  . TEE WITHOUT CARDIOVERSION [name] 11/01/2014   Procedure: TRANSESOPHAGEAL ECHOCARDIOGRAM (TEE);  Surgeon: Rexene Alberts, MD;  Location: [place];  Service: Open Heart Surgery;  Laterality: [name];  . TONSILLECTOMY       Social History:  The patient  reports that he has never smoked. He has never used smokeless tobacco. He reports current alcohol use. He reports that he does not use drugs.   Family  History:  The patient's family history is not on file.    ROS:  Please see the history of present illness. All other systems are reviewed and  Negative to the above problem except as noted.    PHYSICAL EXAM: VS:  BP (!) 144/82   Pulse 63   Ht 6' (1.829 m)   Wt 203 lb 3.2 oz (92.2 kg)   BMI 27.56 kg/m   GEN: Well nourished, well developed, in no acute distress  HEENT: normal  Neck: JVP is normal   NO carotid bruits Cardiac: RRR;  II/VI systolic mrmur LLSB    No rubs, or gallops,no edema  Respiratory:  clear to auscultation bilaterally, normal work of  breathing GI: soft, nontender, nondistended, + BS  No hepatomegaly  MS: no deformity Moving all extremities   Skin: warm and dry, no rash Neuro:  Strength and sensation are intact Psych: euthymic mood, full affect   EKG:  Not done   Lipid Panel    Component Value Date/Time   CHOL 160 10/06/2018 1120   TRIG 61 10/06/2018 1120   HDL 55 10/06/2018 1120   CHOLHDL 2.9 10/06/2018 1120   CHOLHDL 4.0 09/18/2015 1333   VLDL 14 09/18/2015 1333   LDLCALC 93 10/06/2018 1120      Wt Readings from Last 3 Encounters:  05/22/19 203 lb 3.2 oz (92.2 kg)  04/25/19 195 lb 6.4 oz (88.6 kg)  10/06/18 197 lb 6.4 oz (89.5 kg)      ASSESSMENT AND PLAN:  1  PV replacement  Doing good  Echo done Jan 2017 shows valve OK  Murmur unchanged      2  HL  Will check lipids today on current dose of statin     3  HTN  BP is up today and on the past couple visits    I have asked pt to increase amlodipine to 10 mg daily   WIll set him up to have tele visit in 4 to 6 wks   Keep track of BP at home  Current medicines are reviewed at length with the patient today.  The patient does not have concerns regarding medicines.  Signed, Dorris Carnes, MD  05/22/2019 4:40 PM    [facility], [place],   [address] Phone: [phone]; Fax: [phone]

## 2019-05-23 LAB — LIPID PANEL
Chol/HDL Ratio: 3 ratio (ref 0.0–5.0)
Cholesterol, Total: 146 mg/dL (ref 100–199)
HDL: 48 mg/dL (ref >39)
LDL Calculated: 78 mg/dL (ref 0–99)
Triglycerides: 100 mg/dL (ref 0–149)
VLDL Cholesterol Cal: 20 mg/dL (ref 5–40)

## 2019-05-29 ENCOUNTER — Telehealth: Payer: Self-pay

## 2019-05-29 NOTE — Telephone Encounter (Signed)
LMTCB regarding lab results.  

## 2019-05-30 ENCOUNTER — Telehealth: Payer: Self-pay | Admitting: Internal Medicine

## 2019-05-30 NOTE — Telephone Encounter (Signed)
Tried phone number x2.  Call disconnected without ever ringing.  Results and review have been sent to patient via [place].

## 2019-05-30 NOTE — Telephone Encounter (Signed)
New Message   Patient is returning call in reference to lab results. Patient states that he will not be at home until aboput 4:30pm. Please call.

## 2019-06-20 ENCOUNTER — Other Ambulatory Visit: Payer: Self-pay

## 2019-06-20 ENCOUNTER — Telehealth (INDEPENDENT_AMBULATORY_CARE_PROVIDER_SITE_OTHER): Payer: 59 | Admitting: Physician Assistant

## 2019-06-20 ENCOUNTER — Encounter: Payer: Self-pay | Admitting: Physician Assistant

## 2019-06-20 VITALS — BP 120/78 | HR 56 | Ht 72.0 in | Wt 203.0 lb

## 2019-06-20 DIAGNOSIS — I10 Essential (primary) hypertension: Secondary | ICD-10-CM | POA: Diagnosis not present

## 2019-06-20 DIAGNOSIS — I379 Nonrheumatic pulmonary valve disorder, unspecified: Secondary | ICD-10-CM

## 2019-06-20 DIAGNOSIS — Z954 Presence of other heart-valve replacement: Secondary | ICD-10-CM

## 2019-06-20 NOTE — Patient Instructions (Signed)
Medication Instructions:  Your physician recommends that you continue on your current medications as directed. Please refer to the Current Medication list given to you today.  If you need a refill on your cardiac medications before your next appointment, please call your pharmacy.   Lab work: None   If you have labs (blood work) drawn today and your tests are completely normal, you will receive your results only by: Marland Kitchen MyChart Message (if you have MyChart) OR . A paper copy in the mail If you have any lab test that is abnormal or we need to change your treatment, we will call you to review the results.  Testing/Procedures: None   Follow-Up: At [facility], you and your health needs are our priority.  As part of our continuing mission to provide you with exceptional heart care, we have created designated Provider Care Teams.  These Care Teams include your primary Cardiologist (physician) and Advanced Practice Providers (APPs -  Physician Assistants and Nurse Practitioners) who all work together to provide you with the care you need, when you need it. You will need a follow up appointment in:  12 months.  Please call our office 2 months in advance to schedule this appointment.  You may see Dorris Carnes, MD or one of the following Advanced Practice Providers on your designated Care Team: Richardson Dopp, PA-C [place], [place] . Daune Perch, NP  Any Other Special Instructions Will Be Listed Below (If Applicable).

## 2019-06-20 NOTE — Progress Notes (Signed)
Virtual Visit via Telephone Note   This visit type was conducted due to national recommendations for restrictions regarding the COVID-19 Pandemic (e.g. social distancing) in an effort to limit this patient's exposure and mitigate transmission in our community.  Due to his co-morbid illnesses, this patient is at least at moderate risk for complications without adequate follow up.  This format is felt to be most appropriate for this patient at this time.  The patient did not have access to video technology/had technical difficulties with video requiring transitioning to audio format only (telephone).  All issues noted in this document were discussed and addressed.  No physical exam could be performed with this format.  Please refer to the patient's chart for his  consent to telehealth for [facility].   Date:  06/20/2019   ID:  Jeffrey Guerrero, DOB [birth year], MRN [id number]  Patient Location: Home Provider Location: Home  PCP:  Jeffrey Moron, MD  Cardiologist:  Jeffrey Carnes, MD   Electrophysiologist:  None   Evaluation Performed:  Follow-Up Visit  Chief Complaint: Blood pressure  History of Present Illness:    Jeffrey Guerrero is a 68 y.o. male with:  Hx of pulmonic valve endocarditis (S. pneumo sepsis) 4/15  C/b bilat septic pulmonary emboli; severe pulmonic regurgitation  S/p homograft PV replacement 10/2014  Hypertension   Diabetes mellitus 2   Hypothyroidism  Jeffrey Guerrero was last seen by Dr. Harrington Guerrero 05/22/2019.  His BP was up and his Amlodipine was increased.  Since that time, his blood pressure has been doing very well.  He has been obtaining readings approximately 120/70.  He has not had chest pain, shortness of breath, syncope or leg swelling.  The patient does not have symptoms concerning for COVID-19 infection (fever, chills, cough, or new shortness of breath).    Past Medical History:  Diagnosis Date  . Arthritis    "knees" (02/13/2014)  . Bacterial endocarditis - pulmonic  valve vegetation with Streptococcus pneumoniae sepsis 02/16/2014   Pulmonic valve vegetations on TEE with blood cultures positive for Streptococcus pneumoniae, complicated by pulmonary emboli   . Diabetes mellitus without complication ([place])    takes metformin  . Hypertension   . Hypothyroidism   . Pulmonic valve insufficiency 02/16/2014   severe  . S/P pulmonic valve replacement with homograft 11/01/2014  . Thyroid disease    Past Surgical History:  Procedure Laterality Date  . AORTIC VALVE REPLACEMENT [name] 11/01/2014   Procedure: PULMONIC HOMOGRAFT VALVE REPLACEMENT ;  Surgeon: Rexene Alberts, MD;  Location: [place];  Service: Open Heart Surgery;  Laterality: [name];  . APPENDECTOMY    . COLONOSCOPY    . COLONOSCOPY WITH PROPOFOL [name] 11/05/2017   Procedure: COLONOSCOPY WITH PROPOFOL;  Surgeon: [name], MD;  Location: [facility] ENDOSCOPY;  Service: Gastroenterology;  Laterality: [name];  . LEFT HEART CATHETERIZATION WITH CORONARY ANGIOGRAM [name] 10/09/2014   Procedure: LEFT HEART CATHETERIZATION WITH CORONARY ANGIOGRAM;  Surgeon: Jettie Booze, MD;  Location: [facility] C[facility] LAB;  Service: Cardiovascular;  Laterality: [name];  . TEE WITHOUT CARDIOVERSION [name] 02/16/2014   Procedure: TRANSESOPHAGEAL ECHOCARDIOGRAM (TEE);  Surgeon: [name], MD;  Location: [facility] ENDOSCOPY;  Service: Cardiovascular;  Laterality: [name];  . TEE WITHOUT CARDIOVERSION [name] 04/12/2014   Procedure: TRANSESOPHAGEAL ECHOCARDIOGRAM (TEE);  Surgeon: [name], MD;  Location: [place];  Service: Cardiovascular;  Laterality: [name];  . TEE WITHOUT CARDIOVERSION [name] 11/01/2014   Procedure: TRANSESOPHAGEAL ECHOCARDIOGRAM (TEE);  Surgeon: Rexene Alberts, MD;  Location: [place];  Service: Open Heart Surgery;  Laterality: [name];  . TONSILLECTOMY       Current Meds  Medication Sig  . amLODipine (NORVASC) 10 MG tablet Take 1 tablet (10 mg total) by mouth daily.  Marland Kitchen aspirin 81 MG tablet Take 1 tablet (81 mg total) by mouth daily.  Marland Kitchen atorvastatin  (LIPITOR) 20 MG tablet Take 1 tablet (20 mg total) by mouth daily.  Marland Kitchen levothyroxine (SYNTHROID, LEVOTHROID) 100 MCG tablet Take 1 tablet (100 mcg total) by mouth daily before breakfast.  . meloxicam (MOBIC) 7.5 MG tablet Take 1 tablet (7.5mg ) by mouth daily as needed with food. No other NSAIDs.  . metFORMIN (GLUCOPHAGE) 500 MG tablet TAKE ONE TABLET (500 MG TOTAL) BY MOUTH 2 (TWO) TIMES DAILY WITH MEALS.  . Multiple Vitamin (MULTIVITAMIN) tablet Take by mouth.     Allergies:   Patient has no known allergies.   Social History   Tobacco Use  . Smoking status: Never Smoker  . Smokeless tobacco: Never Used  Substance Use Topics  . Alcohol use: Yes    Comment: occasional  . Drug use: No     Family Hx: The patient's family history is negative for Heart disease.  ROS:   Please see the history of present illness.    All other systems reviewed and are negative.   Prior CV studies:   The following studies were reviewed today:  Echocardiogram 11/15/2015 Mild LVH, EF [id number], grade 2 diastolic dysfunction, moderate RVE, mildly reduced RV SF, pulmonic valve prosthesis opens well (peak and mean gradients 22 and 12 respectively)  Pre-CABG Dopplers 10/29/2014 - Bilateral -1% to 39% ICA stenosis. Vertebral artery flow is  antegrade.  - Palmar arch evaluation - Doppler waveforms on the right remained  normal with radial compression and diminished greater than 50%  with ulnar compression. Left Doppler waveforms remained normal  with both radial and ulnar compressions.   Cardiac catheterization 10/09/2014 Widely patent left main coronary artery. Minimal atherosclerotic disease in the  left anterior descending artery and its branches. Minimal atherosclerotic disease in the  left circumflex artery and its branches. Mild disease in the  right coronary artery. Normal left ventricular systolic function.  LVEDP 8 mmHg.  Ejection fraction 60%.  Labs/Other Tests and Data Reviewed:    EKG:   No ECG reviewed.  Recent Labs: 10/06/2018: ALT 33; TSH 3.060 04/25/2019: BUN 19; Creatinine, Ser 1.01; Potassium 4.1; Sodium 146   Recent Lipid Panel Lab Results  Component Value Date/Time   CHOL 146 05/22/2019 04:50 PM   TRIG 100 05/22/2019 04:50 PM   HDL 48 05/22/2019 04:50 PM   CHOLHDL 3.0 05/22/2019 04:50 PM   CHOLHDL 4.0 09/18/2015 01:33 PM   LDLCALC 78 05/22/2019 04:50 PM    Wt Readings from Last 3 Encounters:  06/20/19 203 lb (92.1 kg)  05/22/19 203 lb 3.2 oz (92.2 kg)  04/25/19 195 lb 6.4 oz (88.6 kg)     Objective:    Vital Signs:  BP 120/78   Pulse (!) 56   Ht 6' (1.829 m)   Wt 203 lb (92.1 kg)   BMI 27.53 kg/m    VITAL SIGNS:  reviewed GEN:  no acute distress RESPIRATORY:  No labored breathing NEURO:  Alert and oriented PSYCH:  Normal mood  ASSESSMENT & PLAN:    1. Essential hypertension The patient's blood pressure is controlled on his current regimen.  Continue current therapy.   2. Pulmonic valve disease 3. S/P pulmonic valve replacement with homograft Stable pulmonic valve  prosthesis by echocardiogram in 2017.  Continue SBE prophylaxis.   Time:   Today, I have spent 5 minutes with the patient with telehealth technology discussing the above problems.     Medication Adjustments/Labs and Tests Ordered: Current medicines are reviewed at length with the patient today.  Concerns regarding medicines are outlined above.   Tests Ordered: No orders of the defined types were placed in this encounter.   Medication Changes: No orders of the defined types were placed in this encounter.   Follow Up:  In Person in 1 year(s)  Signed, Richardson Dopp, PA-C  06/20/2019 4:10 PM    [facility]

## 2019-07-03 ENCOUNTER — Other Ambulatory Visit: Payer: Self-pay | Admitting: Family Medicine

## 2019-10-07 ENCOUNTER — Other Ambulatory Visit: Payer: Self-pay | Admitting: Family Medicine

## 2019-10-09 ENCOUNTER — Encounter: Payer: Self-pay | Admitting: Family Medicine

## 2019-10-09 ENCOUNTER — Other Ambulatory Visit: Payer: Self-pay

## 2019-10-09 ENCOUNTER — Ambulatory Visit (INDEPENDENT_AMBULATORY_CARE_PROVIDER_SITE_OTHER): Payer: 59 | Admitting: Family Medicine

## 2019-10-09 VITALS — BP 136/80 | HR 69 | Temp 98.7°F | Resp 16 | Ht 72.0 in | Wt 198.4 lb

## 2019-10-09 DIAGNOSIS — E1142 Type 2 diabetes mellitus with diabetic polyneuropathy: Secondary | ICD-10-CM

## 2019-10-09 DIAGNOSIS — R972 Elevated prostate specific antigen [PSA]: Secondary | ICD-10-CM

## 2019-10-09 DIAGNOSIS — Z0001 Encounter for general adult medical examination with abnormal findings: Secondary | ICD-10-CM

## 2019-10-09 DIAGNOSIS — I10 Essential (primary) hypertension: Secondary | ICD-10-CM

## 2019-10-09 DIAGNOSIS — Z23 Encounter for immunization: Secondary | ICD-10-CM

## 2019-10-09 DIAGNOSIS — Z0000 Encounter for general adult medical examination without abnormal findings: Secondary | ICD-10-CM

## 2019-10-09 LAB — POCT GLYCOSYLATED HEMOGLOBIN (HGB A1C): Hemoglobin A1C: 6 % — AB (ref 4.0–5.6)

## 2019-10-09 MED ORDER — AMLODIPINE BESYLATE 10 MG PO TABS: 10.0000 mg | ORAL_TABLET | Freq: Every day | ORAL | 3 refills | Status: AC

## 2019-10-09 MED ORDER — ATORVASTATIN CALCIUM 20 MG PO TABS: 20.0000 mg | ORAL_TABLET | Freq: Every day | ORAL | 3 refills | Status: DC

## 2019-10-09 NOTE — Patient Instructions (Addendum)
1. Follow up with [facility] 2.    If you have lab work done today you will be contacted with your lab results within the next 2 weeks.  If you have not heard from [place] then please contact us. The fastest way to get your results is to register for My Chart.   IF you received an x-ray today, you will receive an invoice from [facility]. Please contact [facility] at [phone] with questions or concerns regarding your invoice.   IF you received labwork today, you will receive an invoice from [place]. Please contact [facility] at [phone] with questions or concerns regarding your invoice.   Our billing staff will not be able to assist you with questions regarding bills from these companies.  You will be contacted with the lab results as soon as they are available. The fastest way to get your results is to activate your My Chart account. Instructions are located on the last page of this paperwork. If you have not heard from [place] regarding the results in 2 weeks, please contact this office.     Health Maintenance After Age 66 After age 14, you are at a higher risk for certain long-term diseases and infections as well as injuries from falls. Falls are a major cause of broken bones and head injuries in people who are older than age 30. Getting regular preventive care can help to keep you healthy and well. Preventive care includes getting regular testing and making lifestyle changes as recommended by your health care provider. Talk with your health care provider about:  Which screenings and tests you should have. A screening is a test that checks for a disease when you have no symptoms.  A diet and exercise plan that is right for you. What should I know about screenings and tests to prevent falls? Screening and testing are the best ways to find a health problem early. Early diagnosis and treatment give you the best chance of managing medical conditions that are common  after age 33. Certain conditions and lifestyle choices may make you more likely to have a fall. Your health care provider may recommend:  Regular vision checks. Poor vision and conditions such as cataracts can make you more likely to have a fall. If you wear glasses, make sure to get your prescription updated if your vision changes.  Medicine review. Work with your health care provider to regularly review all of the medicines you are taking, including over-the-counter medicines. Ask your health care provider about any side effects that may make you more likely to have a fall. Tell your health care provider if any medicines that you take make you feel dizzy or sleepy.  Osteoporosis screening. Osteoporosis is a condition that causes the bones to get weaker. This can make the bones weak and cause them to break more easily.  Blood pressure screening. Blood pressure changes and medicines to control blood pressure can make you feel dizzy.  Strength and balance checks. Your health care provider may recommend certain tests to check your strength and balance while standing, walking, or changing positions.  Foot health exam. Foot pain and numbness, as well as not wearing proper footwear, can make you more likely to have a fall.  Depression screening. You may be more likely to have a fall if you have a fear of falling, feel emotionally low, or feel unable to do activities that you used to do.  Alcohol use screening. Using too much alcohol can affect your balance  and may make you more likely to have a fall. What actions can I take to lower my risk of falls? General instructions  Talk with your health care provider about your risks for falling. Tell your health care provider if: ? You fall. Be sure to tell your health care provider about all falls, even ones that seem minor. ? You feel dizzy, sleepy, or off-balance.  Take over-the-counter and prescription medicines only as told by your health care  provider. These include any supplements.  Eat a healthy diet and maintain a healthy weight. A healthy diet includes low-fat dairy products, low-fat (lean) meats, and fiber from whole grains, beans, and lots of fruits and vegetables. Home safety  Remove any tripping hazards, such as rugs, cords, and clutter.  Install safety equipment such as grab bars in bathrooms and safety rails on stairs.  Keep rooms and walkways well-lit. Activity   Follow a regular exercise program to stay fit. This will help you maintain your balance. Ask your health care provider what types of exercise are appropriate for you.  If you need a cane or walker, use it as recommended by your health care provider.  Wear supportive shoes that have nonskid soles. Lifestyle  Do not drink alcohol if your health care provider tells you not to drink.  If you drink alcohol, limit how much you have: ? 0-1 drink a day for women. ? 0-2 drinks a day for men.  Be aware of how much alcohol is in your drink. In the [place] one drink equals one typical bottle of beer (12 oz), one-half glass of wine (5 oz), or one shot of hard liquor (1 oz).  Do not use any products that contain nicotine or tobacco, such as cigarettes and e-cigarettes. If you need help quitting, ask your health care provider. Summary  Having a healthy lifestyle and getting preventive care can help to protect your health and wellness after age 16.  Screening and testing are the best way to find a health problem early and help you avoid having a fall. Early diagnosis and treatment give you the best chance for managing medical conditions that are more common for people who are older than age 30.  Falls are a major cause of broken bones and head injuries in people who are older than age 55. Take precautions to prevent a fall at home.  Work with your health care provider to learn what changes you can make to improve your health and wellness and to prevent falls. This  information is not intended to replace advice given to you by your health care provider. Make sure you discuss any questions you have with your health care provider. Document Released: 08/18/2017 Document Revised: 01/26/2019 Document Reviewed: 08/18/2017 Elsevier Patient Education  2020 [employer].

## 2019-10-09 NOTE — Progress Notes (Signed)
QUICK REFERENCE INFORMATION: The ABCs of Providing the Annual Wellness Visit  CMS.gov Medicare Learning Network  Health Maintenance Exam  Subjective:   Jeffrey Guerrero is a 68 y.o. Male who presents for an Annual Wellness Visit.  Patient Active Problem List   Diagnosis Date Noted  . Neuropathy of finger of right hand 04/25/2019  . Polyneuropathy associated with underlying disease ([place]) 04/25/2019  . Diabetes mellitus ([place]) 04/25/2019  . Hypothyroidism 10/05/2017  . Controlled type 2 diabetes mellitus without complication, without long-term current use of insulin ([place]) 04/07/2017  . Dyslipidemia 04/07/2017  . S/P pulmonic valve replacement with homograft 11/01/2014  . Preoperative cardiovascular examination   . Severe pulmonic insufficiency   . Adult hypothyroidism 06/12/2014  . Hyperglycemia 04/02/2014  . Pulmonic valve disease 04/02/2014  . Multiple skin nodules 04/02/2014  . Acute pulmonary embolism ([place]) 02/17/2014  . Other pulmonary embolism and infarction 02/17/2014  . Bacterial endocarditis - pulmonic valve vegetation with Streptococcus pneumoniae sepsis 02/16/2014  . Pulmonic valve insufficiency 02/16/2014  . Essential hypertension, benign 02/14/2014  . Leukocytopenia, unspecified 02/14/2014  . Gram-positive bacteremia 02/14/2014  . ARF (acute renal failure) ([place]) 02/14/2014  . HTN (hypertension) 02/14/2014  . Benign essential HTN 02/14/2014  . Septic shock ([place]) 02/12/2014    Past Medical History:  Diagnosis Date  . Arthritis    "knees" (02/13/2014)  . 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 Diabetes mellitus without complication ([place])    takes metformin  . Hypertension   . Hypothyroidism   . Pulmonic valve insufficiency 02/16/2014   severe  . S/P pulmonic valve replacement with  homograft 11/01/2014  . Thyroid disease      Past Surgical History:  Procedure Laterality Date  . AORTIC VALVE REPLACEMENT [name] 11/01/2014   Procedure: PULMONIC HOMOGRAFT VALVE REPLACEMENT ;  Surgeon: Rexene Alberts, MD;  Location: [place];  Service: Open Heart Surgery;  Laterality: [name];  . APPENDECTOMY    . COLONOSCOPY    . COLONOSCOPY WITH PROPOFOL [name] 11/05/2017   Procedure: COLONOSCOPY WITH PROPOFOL;  Surgeon: [name], MD;  Location: [facility] ENDOSCOPY;  Service: Gastroenterology;  Laterality: [name];  . LEFT HEART CATHETERIZATION WITH CORONARY ANGIOGRAM [name] 10/09/2014   Procedure: LEFT HEART CATHETERIZATION WITH CORONARY ANGIOGRAM;  Surgeon: Jettie Booze, MD;  Location: [facility] CATH LAB;  Service: Cardiovascular;  Laterality: [name];  . TEE WITHOUT CARDIOVERSION [name] 02/16/2014   Procedure: TRANSESOPHAGEAL ECHOCARDIOGRAM (TEE);  Surgeon: [name], MD;  Location: [facility] ENDOSCOPY;  Service: Cardiovascular;  Laterality: [name];  . TEE WITHOUT CARDIOVERSION [name] 04/12/2014   Procedure: TRANSESOPHAGEAL ECHOCARDIOGRAM (TEE);  Surgeon: [name], MD;  Location: [place];  Service: Cardiovascular;  Laterality: [name];  . TEE WITHOUT CARDIOVERSION [name] 11/01/2014   Procedure: TRANSESOPHAGEAL ECHOCARDIOGRAM (TEE);  Surgeon: Rexene Alberts, MD;  Location: [place];  Service: Open Heart Surgery;  Laterality: [name];  . TONSILLECTOMY       Outpatient Medications Prior to Visit  Medication Sig Dispense Refill  . aspirin 81 MG tablet Take 1 tablet (81 mg total) by mouth daily. 90 tablet 3  . levothyroxine (SYNTHROID) 100 MCG tablet TAKE 1 TABLET (100 MCG TOTAL) BY MOUTH DAILY BEFORE BREAKFAST. 90 tablet 1  . meloxicam (MOBIC) 7.5 MG tablet Take 1 tablet (7.5mg ) by mouth daily as needed with food. No other NSAIDs. 30 tablet 3  . metFORMIN (GLUCOPHAGE)  500 MG tablet TAKE ONE TABLET (500 MG TOTAL) BY MOUTH 2 (TWO) TIMES DAILY WITH MEALS. 180 tablet 3  . Multiple Vitamin (MULTIVITAMIN) tablet Take by mouth.    Marland Kitchen  atorvastatin (LIPITOR) 20 MG tablet TAKE 1 TABLET BY MOUTH EVERY DAY 30 tablet 11  . amLODipine (NORVASC) 10 MG tablet Take 1 tablet (10 mg total) by mouth daily. 90 tablet 3   No facility-administered medications prior to visit.    No Known Allergies   Family History  Problem Relation Age of Onset  . Heart disease Neg Hx      Social History   Socioeconomic History  . Marital status: Married    Spouse name: Not on file  . Number of children: Not on file  . Years of education: Not on file  . Highest education level: Not on file  Occupational History  . Occupation: Welder  Tobacco Use  . Smoking status: Never Smoker  . Smokeless tobacco: Never Used  Substance and Sexual Activity  . Alcohol use: Yes    Comment: occasional  . Drug use: No  . Sexual activity: Yes  Other Topics Concern  . Not on file  Social History Narrative   Married   Social Determinants of Health   Financial Resource Strain:   . Difficulty of Paying Living Expenses: Not on file  Food Insecurity:   . Worried About Charity fundraiser in the Last Year: Not on file  . Ran Out of Food in the Last Year: Not on file  Transportation Needs:   . Lack of Transportation (Medical): Not on file  . Lack of Transportation (Non-Medical): Not on file  Physical Activity:   . Days of Exercise per Week: Not on file  . Minutes of Exercise per Session: Not on file  Stress:   . Feeling of Stress : Not on file  Social Connections:   . Frequency of Communication with Friends and Family: Not on file  . Frequency of Social Gatherings with Friends and Family: Not on file  . Attends Religious Services: Not on file  . Active Member of Clubs or Organizations: Not on file  . Attends Archivist Meetings: Not on file  . Marital Status: Not on file      Recent Hospitalizations? No  Health Habits: Current exercise activities include: walking Exercise: 7 times/week. Diet: in general, a "healthy" diet     Alcohol intake: none  Health Risk Assessment: The patient has completed a Health Risk Assessment. This has been reveiwed with them and has been scanned into the [place] system as an attached document.  Current Medical Providers and Suppliers: [facility] Patient Care Team: Forrest Moron, MD as PCP - General (Internal Medicine) [name], MD as PCP - Cardiology (Cardiology) No future appointments.   Age-appropriate Screening Schedule: The list below includes current immunization status and future screening recommendations based on patient's age. Orders for these recommended tests are listed in the plan section. The patient has been provided with a written plan. Immunization History  Administered Date(s) Administered  . Influenza Split 07/28/2013  . Influenza-Unspecified 07/19/2015, 07/18/2017  . Pneumococcal Conjugate-13 09/18/2015  . Pneumococcal Polysaccharide-23 11/16/2015  . Tdap 09/23/2016    Health Maintenance reviewed -  urine microalbumin ordered, glycohemoglobin ordered   Depression Screen-PHQ2/9 completed today  Depression screen [facility] 2/9 10/09/2019 04/25/2019 10/06/2018 04/04/2018 09/29/2017  Decreased Interest 0 0 0 0 0  Down, Depressed, Hopeless 0 0 0 0 0  PHQ - 2  Score 0 0 0 0 0       Depression Severity and Treatment Recommendations:  0-4= None  5-9= Mild / Treatment: Support, educate to call if worse; return in one month  10-14= Moderate / Treatment: Support, watchful waiting; Antidepressant or Psycotherapy  15-19= Moderately severe / Treatment: Antidepressant OR Psychotherapy  >= 20 = Major depression, severe / Antidepressant AND Psychotherapy  Functional Status Survey:   Is the patient deaf or have difficulty hearing?: No Does the patient have difficulty seeing, even when wearing glasses/contacts?: No Does the patient have difficulty concentrating, remembering, or making decisions?: No Does the patient have difficulty walking or climbing stairs?: No Does  the patient have difficulty dressing or bathing?: No Does the patient have difficulty doing errands alone such as visiting a doctor's office or shopping?: No   Identification of Risk Factors: Risk factors include: diabetes mellitus and hyperlipidemia  ROS  Review of Systems  Constitutional: Negative for activity change, appetite change, chills and fever.  HENT: Negative for congestion, nosebleeds, trouble swallowing and voice change.   Respiratory: Negative for cough, shortness of breath and wheezing.   Gastrointestinal: Negative for diarrhea, nausea and vomiting.  Genitourinary: Negative for difficulty urinating, dysuria, flank pain and hematuria.  Musculoskeletal: Negative for back pain, joint swelling and neck pain.  Neurological: Negative for dizziness, speech difficulty, light-headedness and numbness.  See HPI. All other review of systems negative.     Objective:   Vitals:   10/09/19 0912 10/09/19 0921  BP: (!) 158/90 136/80  Pulse: 69   Resp: 16   Temp: 98.7 F (37.1 C)   TempSrc: Oral   SpO2: 98%   Weight: 198 lb 6.4 oz (90 kg)   Height: 6' (1.829 m)     Body mass index is 26.91 kg/m.  BP 136/80 (BP Location: Left Arm, Patient Position: Sitting, Cuff Size: Large)   Pulse 69   Temp 98.7 F (37.1 C) (Oral)   Resp 16   Ht 6' (1.829 m)   Wt 198 lb 6.4 oz (90 kg)   SpO2 98%   BMI 26.91 kg/m   General Appearance:    Alert, cooperative, no distress, appears stated age  Head:    Normocephalic, without obvious abnormality, atraumatic  Eyes:    conjunctiva/corneas clear, EOM's intact  Ears:    Normal TM's and external ear canals, both ears  Nose:   Nares normal, septum midline, mucosa normal, no drainage    or sinus tenderness  Throat:   Lips, mucosa, and tongue normal; teeth and gums normal  Neck:   Supple, symmetrical, trachea midline, no adenopathy;    thyroid:  no enlargement/tenderness/nodules; no carotid   bruit or JVD  Back:     Symmetric, no curvature,  ROM normal, no CVA tenderness  Lungs:     Clear to auscultation bilaterally, respirations unlabored  Chest Wall:    No tenderness or deformity   Heart:    Regular rate and rhythm, systolic murmur heart across the precordium  Abdomen:     Soft, non-tender, bowel sounds active all four quadrants,    no masses, no organomegaly  Rectal:    Normal tone, normal prostate, no masses or tenderness  Extremities:   Extremities normal, atraumatic, no cyanosis or edema  Pulses:   2+ and symmetric all extremities  Skin:   Skin color, texture, turgor normal, no rashes or lesions  Lymph nodes:   Cervical, supraclavicular, and axillary nodes normal  Neurologic:   CNII-XII intact, normal  strength, sensation and reflexes    throughout      Assessment/Plan:   Patient Self-Management and Personalized Health Advice The patient has been provided with information about:  continue current healthy lifestyle patterns  During the course of the visit the patient was educated and counseled about appropriate screening and preventive services including:   lab testing as noted in orders section, return in six months     Body mass index is 26.91 kg/m. Discussed the patient's BMI with him. The BMI BMI is in the acceptable range  Shneur was seen today for annual exam.  Diagnoses and all orders for this visit:  Encounter for annual health examination-  Discussed health maintenance exam  Type 2 diabetes mellitus with diabetic polyneuropathy, without long-term current use of insulin ([place]) - well controlled hemoglobin a1c is at goal Continue exercise Lipids monitored and renal function in range On metformin On ace or arb On asa 81mg  Reviewed diabetic foot care Emphasized importance of eye and dental exam    -     POCT glycosylated hemoglobin (Hb A1C) -     Comprehensive metabolic panel -     Microalbumin, urine  Essential hypertension, benign- stable, cpm -     Comprehensive metabolic panel  Need for  prophylactic vaccination and inoculation against influenza  Elevated PSA- normalized, rectal exam normal  -     PSA  Other orders -     amLODipine (NORVASC) 10 MG tablet; Take 1 tablet (10 mg total) by mouth daily. -     atorvastatin (LIPITOR) 20 MG tablet; Take 1 tablet (20 mg total) by mouth daily.      No follow-ups on file.  No future appointments.  Patient Instructions    1. Follow up with [facility] 2.    If you have lab work done today you will be contacted with your lab results within the next 2 weeks.  If you have not heard from [place] then please contact us. The fastest way to get your results is to register for My Chart.   IF you received an x-ray today, you will receive an invoice from [facility]. Please contact [facility] at [phone] with questions or concerns regarding your invoice.   IF you received labwork today, you will receive an invoice from [facility]. Please contact [facility] at [phone] with questions or concerns regarding your invoice.   Our billing staff will not be able to assist you with questions regarding bills from these companies.  You will be contacted with the lab results as soon as they are available. The fastest way to get your results is to activate your My Chart account. Instructions are located on the last page of this paperwork. If you have not heard from [place] regarding the results in 2 weeks, please contact this office.     Health Maintenance After Age 96 After age 59, you are at a higher risk for certain long-term diseases and infections as well as injuries from falls. Falls are a major cause of broken bones and head injuries in people who are older than age 19. Getting regular preventive care can help to keep you healthy and well. Preventive care includes getting regular testing and making lifestyle changes as recommended by your health care provider. Talk with your health care provider about:  Which screenings  and tests you should have. A screening is a test that checks for a disease when you have no symptoms.  A diet and exercise plan that is  right for you. What should I know about screenings and tests to prevent falls? Screening and testing are the best ways to find a health problem early. Early diagnosis and treatment give you the best chance of managing medical conditions that are common after age 39. Certain conditions and lifestyle choices may make you more likely to have a fall. Your health care provider may recommend:  Regular vision checks. Poor vision and conditions such as cataracts can make you more likely to have a fall. If you wear glasses, make sure to get your prescription updated if your vision changes.  Medicine review. Work with your health care provider to regularly review all of the medicines you are taking, including over-the-counter medicines. Ask your health care provider about any side effects that may make you more likely to have a fall. Tell your health care provider if any medicines that you take make you feel dizzy or sleepy.  Osteoporosis screening. Osteoporosis is a condition that causes the bones to get weaker. This can make the bones weak and cause them to break more easily.  Blood pressure screening. Blood pressure changes and medicines to control blood pressure can make you feel dizzy.  Strength and balance checks. Your health care provider may recommend certain tests to check your strength and balance while standing, walking, or changing positions.  Foot health exam. Foot pain and numbness, as well as not wearing proper footwear, can make you more likely to have a fall.  Depression screening. You may be more likely to have a fall if you have a fear of falling, feel emotionally low, or feel unable to do activities that you used to do.  Alcohol use screening. Using too much alcohol can affect your balance and may make you more likely to have a fall. What actions can I  take to lower my risk of falls? General instructions  Talk with your health care provider about your risks for falling. Tell your health care provider if: ? You fall. Be sure to tell your health care provider about all falls, even ones that seem minor. ? You feel dizzy, sleepy, or off-balance.  Take over-the-counter and prescription medicines only as told by your health care provider. These include any supplements.  Eat a healthy diet and maintain a healthy weight. A healthy diet includes low-fat dairy products, low-fat (lean) meats, and fiber from whole grains, beans, and lots of fruits and vegetables. Home safety  Remove any tripping hazards, such as rugs, cords, and clutter.  Install safety equipment such as grab bars in bathrooms and safety rails on stairs.  Keep rooms and walkways well-lit. Activity   Follow a regular exercise program to stay fit. This will help you maintain your balance. Ask your health care provider what types of exercise are appropriate for you.  If you need a cane or walker, use it as recommended by your health care provider.  Wear supportive shoes that have nonskid soles. Lifestyle  Do not drink alcohol if your health care provider tells you not to drink.  If you drink alcohol, limit how much you have: ? 0-1 drink a day for women. ? 0-2 drinks a day for men.  Be aware of how much alcohol is in your drink. In the [place] one drink equals one typical bottle of beer (12 oz), one-half glass of wine (5 oz), or one shot of hard liquor (1 oz).  Do not use any products that contain nicotine or tobacco, such as cigarettes and e-cigarettes.  If you need help quitting, ask your health care provider. Summary  Having a healthy lifestyle and getting preventive care can help to protect your health and wellness after age 49.  Screening and testing are the best way to find a health problem early and help you avoid having a fall. Early diagnosis and treatment give you  the best chance for managing medical conditions that are more common for people who are older than age 94.  Falls are a major cause of broken bones and head injuries in people who are older than age 51. Take precautions to prevent a fall at home.  Work with your health care provider to learn what changes you can make to improve your health and wellness and to prevent falls. This information is not intended to replace advice given to you by your health care provider. Make sure you discuss any questions you have with your health care provider. Document Released: 08/18/2017 Document Revised: 01/26/2019 Document Reviewed: 08/18/2017 Elsevier Patient Education  2020 [employer].    An after visit summary with all of these plans was given to the patient.

## 2019-10-10 LAB — COMPREHENSIVE METABOLIC PANEL
ALT: 21 IU/L (ref 0–44)
AST: 21 IU/L (ref 0–40)
Albumin/Globulin Ratio: 1.5 (ref 1.2–2.2)
Albumin: 4.3 g/dL (ref 3.8–4.8)
Alkaline Phosphatase: 60 IU/L (ref 39–117)
BUN/Creatinine Ratio: 14 (ref 10–24)
BUN: 16 mg/dL (ref 8–27)
Bilirubin Total: 0.6 mg/dL (ref 0.0–1.2)
CO2: 22 mmol/L (ref 20–29)
Calcium: 9.4 mg/dL (ref 8.6–10.2)
Chloride: 103 mmol/L (ref 96–106)
Creatinine, Ser: 1.17 mg/dL (ref 0.76–1.27)
GFR calc Af Amer: 73 mL/min/{1.73_m2} (ref >59)
GFR calc non Af Amer: 63 mL/min/{1.73_m2} (ref >59)
Globulin, Total: 2.9 g/dL (ref 1.5–4.5)
Glucose: 116 mg/dL — ABNORMAL HIGH (ref 65–99)
Potassium: 4.2 mmol/L (ref 3.5–5.2)
Sodium: 139 mmol/L (ref 134–144)
Total Protein: 7.2 g/dL (ref 6.0–8.5)

## 2019-10-10 LAB — MICROALBUMIN, URINE: Microalbumin, Urine: 4.6 ug/mL

## 2019-10-10 LAB — PSA: Prostate Specific Ag, Serum: 1.1 ng/mL (ref 0.0–4.0)

## 2019-10-27 ENCOUNTER — Ambulatory Visit: Payer: 59 | Admitting: Family Medicine

## 2019-11-05 ENCOUNTER — Other Ambulatory Visit: Payer: Self-pay | Admitting: Family Medicine

## 2019-11-10 ENCOUNTER — Other Ambulatory Visit: Payer: Self-pay | Admitting: Family Medicine

## 2019-11-17 ENCOUNTER — Other Ambulatory Visit: Payer: Self-pay | Admitting: Family Medicine

## 2019-11-17 NOTE — Telephone Encounter (Signed)
Requested medication (s) are due for refill today:yes  Requested medication (s) are on the active medication list: yes  Last refill:  10/06/18  Future visit scheduled: yes  Notes to clinic: RX expired    Requested Prescriptions  Pending Prescriptions Disp Refills   meloxicam (MOBIC) 7.5 MG tablet [Pharmacy Med Name: MELOXICAM 7.5 MG TABLET] 30 tablet 3    Sig: Take 1 tablet (7.5mg ) by mouth daily as needed with food. No other NSAIDs.      Analgesics:  COX2 Inhibitors Failed - 11/17/2019  4:44 PM      Failed - HGB in normal range and within 360 days    Hemoglobin  Date Value Ref Range Status  09/18/2015 14.0 13.0 - 17.0 g/dL Final          Passed - Cr in normal range and within 360 days    Creat  Date Value Ref Range Status  09/18/2015 0.87 0.70 - 1.25 mg/dL Final   Creatinine, Ser  Date Value Ref Range Status  10/09/2019 1.17 0.76 - 1.27 mg/dL Final   Creatinine, Urine  Date Value Ref Range Status  02/13/2014 140.47 mg/dL Final          Passed - Patient is not pregnant      Passed - Valid encounter within last 12 months    Recent Outpatient Visits           1 month ago Encounter for annual health examination   Primary Care at [place], MD   6 months ago Essential hypertension, benign   Primary Care at [facility], Rex Kras, MD   1 year ago Health maintenance examination   Primary Care at [facility], Arlie Solomons, MD   1 year ago Essential hypertension, benign   Primary Care at [facility], Arlie Solomons, MD   2 years ago Health maintenance examination   Primary Care at [place], MD       Future Appointments             In 4 months Forrest Moron, MD Primary Care at [place], [facility]   In 10 months Forrest Moron, MD Primary Care at [place], [facility]

## 2019-11-20 NOTE — Telephone Encounter (Signed)
Please schedule patient an appt to be seen for refills

## 2019-11-21 ENCOUNTER — Other Ambulatory Visit: Payer: Self-pay

## 2019-11-21 ENCOUNTER — Telehealth (INDEPENDENT_AMBULATORY_CARE_PROVIDER_SITE_OTHER): Payer: 59 | Admitting: Family Medicine

## 2019-11-21 DIAGNOSIS — Z760 Encounter for issue of repeat prescription: Secondary | ICD-10-CM

## 2019-11-21 MED ORDER — MELOXICAM 7.5 MG PO TABS: ORAL_TABLET | ORAL | 1 refills | Status: AC

## 2019-11-21 NOTE — Progress Notes (Signed)
Patient still at work. His wife states that he only  Needs a refill of his meloxicam.  He was just seen a month ago.   Will refill meloxicam   Unable to reach patient.

## 2019-11-21 NOTE — Telephone Encounter (Signed)
Pt scheduled telemed to speak with pcp about med refill

## 2020-01-11 ENCOUNTER — Other Ambulatory Visit: Payer: Self-pay | Admitting: Family Medicine

## 2020-01-11 NOTE — Telephone Encounter (Signed)
Requested Prescriptions  Pending Prescriptions Disp Refills  . levothyroxine (SYNTHROID) 100 MCG tablet [Pharmacy Med Name: LEVOTHYROXINE 100 MCG TABLET] 90 tablet 1    Sig: TAKE 1 TABLET (100 MCG TOTAL) BY MOUTH DAILY BEFORE BREAKFAST.     Endocrinology:  Hypothyroid Agents Failed - 01/11/2020  9:59 AM      Failed - TSH needs to be rechecked within 3 months after an abnormal result. Refill until TSH is due.      Failed - TSH in normal range and within 360 days    TSH  Date Value Ref Range Status  10/06/2018 3.060 0.450 - 4.500 uIU/mL Final         Passed - Valid encounter within last 12 months    Recent Outpatient Visits          1 month ago Medication refill   Primary Care at [place], MD   3 months ago Encounter for annual health examination   Primary Care at [facility] [place], MD   8 months ago Essential hypertension, benign   Primary Care at [facility], Rex Kras, MD   1 year ago Health maintenance examination   Primary Care at [facility], Arlie Solomons, MD   1 year ago Essential hypertension, benign   Primary Care at [place], MD      Future Appointments            In 2 months Forrest Moron, MD Primary Care at [place], [facility]   In 9 months Forrest Moron, MD Primary Care at [place], [facility]

## 2020-01-15 ENCOUNTER — Telehealth: Payer: Self-pay | Admitting: Family Medicine

## 2020-01-15 DIAGNOSIS — K4090 Unilateral inguinal hernia, without obstruction or gangrene, not specified as recurrent: Secondary | ICD-10-CM

## 2020-01-15 NOTE — Telephone Encounter (Signed)
Pt called,his need a Referral for his Hernia,To [facility]

## 2020-01-17 NOTE — Telephone Encounter (Signed)
Referral has been placed. 

## 2020-01-23 ENCOUNTER — Ambulatory Visit: Payer: Self-pay | Admitting: Family Medicine

## 2020-01-24 ENCOUNTER — Ambulatory Visit: Payer: Self-pay | Admitting: Family Medicine

## 2020-01-29 ENCOUNTER — Encounter: Payer: Self-pay | Admitting: Surgery

## 2020-01-29 ENCOUNTER — Telehealth: Payer: Self-pay

## 2020-01-29 ENCOUNTER — Telehealth: Payer: Self-pay | Admitting: *Deleted

## 2020-01-29 ENCOUNTER — Other Ambulatory Visit: Payer: Self-pay

## 2020-01-29 ENCOUNTER — Ambulatory Visit: Payer: BC Managed Care – PPO | Admitting: Surgery

## 2020-01-29 VITALS — BP 140/86 | HR 76 | Temp 97.3°F | Ht 73.0 in | Wt 205.4 lb

## 2020-01-29 DIAGNOSIS — K4030 Unilateral inguinal hernia, with obstruction, without gangrene, not specified as recurrent: Secondary | ICD-10-CM

## 2020-01-29 NOTE — H&P (View-Only) (Signed)
01/29/2020  History of Present Illness: Jeffrey Guerrero is a 69 y.o. male presenting for follow up of a left inguinal hernia.  He was seen last by Dr. Tama High in 09/2017.  At that time, he had an asymptomatic, reducible left inguinal hernia.  It was decided to proceed with conservative management.  The patient has since noted, starting December 2020, that the hernia is no longer reducible and remains bulging out.  He denies any symptoms but perhaps some discomfort only.  He is unable to reduce it himself and it does not reduce on its own.  He denies any nausea, vomiting, chest pain, shortness of breath, constipation, diarrhea, or urinary issues.  He still works as a Building control surveyor.    Past Medical History: Past Medical History:  Diagnosis Date  . Arthritis    "knees" (02/13/2014)  . 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 Diabetes mellitus without complication ([place])    takes metformin  . Hypertension   . Hypothyroidism   . Pulmonic valve insufficiency 02/16/2014   severe  . S/P pulmonic valve replacement with homograft 11/01/2014  . Thyroid disease      Past Surgical History: Past Surgical History:  Procedure Laterality Date  . AORTIC VALVE REPLACEMENT [name] 11/01/2014   Procedure: PULMONIC HOMOGRAFT VALVE REPLACEMENT ;  Surgeon: Rexene Alberts, MD;  Location: [place];  Service: Open Heart Surgery;  Laterality: [name];  . APPENDECTOMY    . COLONOSCOPY    . COLONOSCOPY WITH PROPOFOL [name] 11/05/2017   Procedure: COLONOSCOPY WITH PROPOFOL;  Surgeon: [name], MD;  Location[facility] ENDOSCOPY;  Service: Gastroenterology;  Laterality: [name];  . LEFT HEART CATHETERIZATION WITH CORONARY ANGIOGRAM [name] 10/09/2014   Procedure: LEFT HEART CATHETERIZATION WITH CORONARY ANGIOGRAM;  Surgeon: Jettie Booze, MD;  Location: [facility] CATH  LAB;  Service: Cardiovascular;  Laterality: [name];  . TEE WITHOUT CARDIOVERSION [name] 02/16/2014   Procedure: TRANSESOPHAGEAL ECHOCARDIOGRAM (TEE);  Surgeon: [name], MD;  Location: [facility] ENDOSCOPY;  Service: Cardiovascular;  Laterality: [name];  . TEE WITHOUT CARDIOVERSION [name] 04/12/2014   Procedure: TRANSESOPHAGEAL ECHOCARDIOGRAM (TEE);  Surgeon: [name], MD;  Location: [place];  Service: Cardiovascular;  Laterality: [name];  . TEE WITHOUT CARDIOVERSION [name] 11/01/2014   Procedure: TRANSESOPHAGEAL ECHOCARDIOGRAM (TEE);  Surgeon: Rexene Alberts, MD;  Location: [place];  Service: Open Heart Surgery;  Laterality: [name];  . TONSILLECTOMY      Home Medications: Prior to Admission medications   Medication Sig Start Date End Date Taking? Authorizing Provider  aspirin 81 MG tablet Take 1 tablet (81 mg total) by mouth daily. 10/06/18  Yes Stallings, Zoe A, MD  atorvastatin (LIPITOR) 20 MG tablet Take 1 tablet (20 mg total) by mouth daily. 10/09/19  Yes Forrest Moron, MD  levothyroxine (SYNTHROID) 100 MCG tablet TAKE 1 TABLET (100 MCG TOTAL) BY MOUTH DAILY BEFORE BREAKFAST. 01/11/20  Yes Stallings, Zoe A, MD  meloxicam (MOBIC) 7.5 MG tablet Take 1 tablet (7.5mg ) by mouth daily as needed with food. No other NSAIDs. 11/21/19  Yes Stallings, Zoe A, MD  metFORMIN (GLUCOPHAGE) 500 MG tablet TAKE 1 TABLET BY MOUTH TWICE A DAY WITH MEALS 11/10/19  Yes Delia Chimes A, MD  Multiple Vitamin (MULTIVITAMIN) tablet Take by mouth.   Yes [provider]  amLODipine (NORVASC) 10 MG tablet Take 1 tablet (10 mg total) by mouth daily.  10/09/19 01/07/20  Forrest Moron, MD    Allergies: No Known Allergies  Review of Systems: Review of Systems  Constitutional: Negative for chills and fever.  HENT: Negative for hearing loss.   Respiratory: Negative for shortness of breath.   Cardiovascular: Negative for chest pain.  Gastrointestinal: Negative for abdominal pain, constipation, diarrhea, nausea and vomiting.   Genitourinary: Negative for dysuria.  Musculoskeletal: Negative for myalgias.  Skin: Negative for rash.  Neurological: Negative for dizziness.  Psychiatric/Behavioral: Negative for depression.    Physical Exam BP 140/86   Pulse 76   Temp (!) 97.3 F (36.3 C) (Temporal)   Ht 6\' 1"  (1.854 m)   Wt 205 lb 6.4 oz (93.2 kg)   SpO2 97%   BMI 27.10 kg/m  CONSTITUTIONAL: No acute distress HEENT:  Normocephalic, atraumatic, extraocular motion intact. RESPIRATORY:  Lungs are clear, and breath sounds are equal bilaterally. Normal respiratory effort without pathologic use of accessory muscles. CARDIOVASCULAR: Heart is regular without murmurs, gallops, or rubs. GI: The abdomen is soft, non-distended, non-tender to palpation.  Despite of a long time trying during this appointment, I was unable to reduce his left inguinal hernia.  However, the patient had no pain or discomfort at any time during attempts at reduction.  The hernia sac is soft and likely contains bowel.  The sac extends to the scrotum.  NEUROLOGIC:  Motor and sensation is grossly normal.  Cranial nerves are grossly intact. PSYCH:  Alert and oriented to person, place and time. Affect is normal.  Labs/Imaging: Labs from 10/09/19: Na 139, K 4.2, Cl 103, CO2 22, BUN 16, Cr 1.17.  LFTs within normal.  HA1c 6.0, PSA 1.1.  Assessment and Plan: This is a 69 y.o. male with an incarcerated left inguinal hernia.  Despite of long attempt at reducing his hernia, it remains incarcerated.  However, it is otherwise asymptomatic with only some discomfort.  There is no evidence of strangulation at all.  However, given the progression, I discussed with the patient that it would be best to proceed with inguinal hernia repair.  Discussed with him the robotic assisted inguinal hernia repair approach, creating a peritoneal flap as part of the dissection and using it to fully cover the mesh, creating a natural tissue barrier for the bowel and peritoneal  cavity.  Discussed that we'd be able to examine the right side as well and repair if any hernia is noted there.  Discussed risks of bleeding, infection, and injury to surrounding structures.  He's willing to proceed.  He understands he would need to stop his Aspirin three days prior.  He will check with his job and see when the best timing for surgery would be.  I did encourage him to schedule sooner rather than later, as I would not want this hernia to progress to a worse scenario.  He will call us back with dates that work for him.  In the meantime, given his cardiac surgery history with pulmonic valve replacement, we'll also send medical and cardiology clearance forms.  Face-to-face time spent with the patient and care providers was 40 minutes, with more than 50% of the time spent counseling, educating, and coordinating care of the patient.     Melvyn Neth, [facility]

## 2020-01-29 NOTE — Telephone Encounter (Signed)
   [facility] Pre-operative Risk Assessment    Request for surgical clearance:  1. What type of surgery is being performed?  ROBOTIC LEFT INGUINAL HERNIA REPAIR   2. When is this surgery scheduled?  02/08/20   3. What type of clearance is required (medical clearance vs. Pharmacy clearance to hold med vs. Both)?  BOTH  4. Are there any medications that need to be held prior to surgery and how long?  ASPIRIN  5. Practice name and name of physician performing surgery?  [facility] / DR. [place]    6. What is your office phone number [phone]    7.   What is your office fax number [phone]  8.   Anesthesia type (None, local, MAC, general) ?  GENERAL ANESTHESIA    Jeffrey Guerrero 01/29/2020, 4:25 PM  _________________________________________________________________   (provider comments below)

## 2020-01-29 NOTE — Patient Instructions (Addendum)
Our surgery scheduler Marzetta Board will contact you within the next 24-48 hours. During that call, Marzetta Board will contact you to discuss the preparation prior to surgery and she will also discuss the different dates and times to schedule you for surgery. Please have the BLUE sheet available when she contacts you. If you have any questions or concerns, please feel free to contact our office.  Patient is to STOP Aspirin 3 days prior to surgery.   Inguinal Hernia, Adult An inguinal hernia is when fat or your intestines push through a weak spot in a muscle where your leg meets your lower belly (groin). This causes a rounded lump (bulge). This kind of hernia could also be:  In your scrotum, if you are male.  In folds of skin around your vagina, if you are male. There are three types of inguinal hernias. These include:  Hernias that can be pushed back into the belly (are reducible). This type rarely causes pain.  Hernias that cannot be pushed back into the belly (are incarcerated).  Hernias that cannot be pushed back into the belly and lose their blood supply (are strangulated). This type needs emergency surgery. If you do not have symptoms, you may not need treatment. If you have symptoms or a large hernia, you may need surgery. Follow these instructions at home: Lifestyle  Do these things if told by your doctor so you do not have trouble pooping (constipation): ? Drink enough fluid to keep your pee (urine) pale yellow. ? Eat foods that have a lot of fiber. These include fresh fruits and vegetables, whole grains, and beans. ? Limit foods that are high in fat and processed sugars. These include foods that are fried or sweet. ? Take medicine for trouble pooping.  Avoid lifting heavy objects.  Avoid standing for long amounts of time.  Do not use any products that contain nicotine or tobacco. These include cigarettes and e-cigarettes. If you need help quitting, ask your doctor.  Stay at a healthy  weight. General instructions  You may try to push your hernia in by very gently pressing on it when you are lying down. Do not try to force the bulge back in if it will not push in easily.  Watch your hernia for any changes in shape, size, or color. Tell your doctor if you see any changes.  Take over-the-counter and prescription medicines only as told by your doctor.  Keep all follow-up visits as told by your doctor. This is important. Contact a doctor if:  You have a fever.  You have new symptoms.  Your symptoms get worse. Get help right away if:  The area where your leg meets your lower belly has: ? Pain that gets worse suddenly. ? A bulge that gets bigger suddenly, and it does not get smaller after that. ? A bulge that turns red or purple. ? A bulge that is painful when you touch it.  You are a man, and your scrotum: ? Suddenly feels painful. ? Suddenly changes in size.  You cannot push the hernia in by very gently pressing on it when you are lying down. Do not try to force the bulge back in if it will not push in easily.  You feel sick to your stomach (nauseous), and that feeling does not go away.  You throw up (vomit), and that keeps happening.  You have a fast heartbeat.  You cannot poop (have a bowel movement) or pass gas. These symptoms may be an emergency. Do  not wait to see if the symptoms will go away. Get medical help right away. Call your local emergency services (911 in the [place] Summary  An inguinal hernia is when fat or your intestines push through a weak spot in a muscle where your leg meets your lower belly (groin). This causes a rounded lump (bulge).  If you do not have symptoms, you may not need treatment. If you have symptoms or a large hernia, you may need surgery.  Avoid lifting heavy objects. Also avoid standing for long amounts of time.  Do not try to force the bulge back in if it will not push in easily. This information is not intended to  replace advice given to you by your health care provider. Make sure you discuss any questions you have with your health care provider. Document Revised: 11/06/2017 Document Reviewed: 07/07/2017 Elsevier Patient Education  2020 [place] [facility] Information Robot-assisted laparoscopic surgery is a type of procedure that allows a surgeon to use robotic arms to control tools and a camera. Unlike traditional (open) surgery in which a large incision is made, this type of surgery uses a few small incisions (minimally invasive surgery). Laparoscopic surgery means that the surgery is performed in the abdomen using a thin, flexible tube that has a light and a camera on the end (laparoscope). The image from the camera is shown on a monitor. What types of surgeries are performed with robot assistance? This type of surgery is commonly used for:  Prostate surgery.  Surgery on the uterus or ovaries.  Kidney surgery.  Colon surgery.  Hernia repair surgery.  Bariatric surgery. What is the difference between procedures performed with and without robot assistance?   During open surgery, a large incision is made. This allows the surgeon to access the organ or body part that he or she is operating on. During these procedures, the surgeon holds the surgical tools in her or his hands.  During a laparoscopic procedure, a few small incisions are made. A camera is inserted so the surgeon can see the body part. The surgeon holds the tools in her or his hands to reach the body part through the smaller incisions.  During a robot-assisted laparoscopic procedure, a few small incisions are made. The surgeon sits at a console in the operating room and uses the controls at the console to move the robotic arms. The surgical tools and laparoscope are attached to the ends of the robotic arms. What are the benefits of robot-assisted laparoscopic surgery, compared to open surgery? The  incisions for this type of surgery are smaller. This may result in:  A shorter hospital stay.  Faster healing time.  Less blood loss.  Less scarring.  Less pain. In some cases, this type of surgery may also take less time to do than regular, open surgery. What are the risks of robot-assisted laparoscopic surgery? This type of surgery has many of the same risks as regular, open surgery. Risks include:  Infection.  Bleeding.  Damage to other structures or organs.  Allergic reactions to medicines, such as anesthetics.  Pain, bruising, and swelling. Other side effects may depend on the type of surgery that is done. For example, prostate surgery may cause problems with urinating. What questions should I ask my health care provider?  What is the goal of the surgery?  Is this surgery the best treatment option? What other options do I have?  Does the surgeon have experience with this type of  surgery? How much experience does the surgeon have?  How long will surgery take?  How long will it take to recover from surgery?  What are the possible short-term and long-term side effects of this surgery?  What are the risks of this surgery?  What are the costs compared to other surgical choices?  How should I prepare for surgery? Follow these instructions at home: After the procedure your health care provider will give you more specific instructions. If you have problems or questions, contact your health care provider. After the procedure you should:  Rest as told by your health care provider.  Avoid sitting for a long time without moving. Get up to take short walks every 1-2 hours. This is important to improve blood flow and breathing. Ask for help if you feel weak or unsteady.  Drink enough fluid to keep your urine pale yellow.  Do not use any products that contain nicotine or tobacco, such as cigarettes, e-cigarettes, and chewing tobacco. These can delay incision healing after  surgery. If you need help quitting, ask your health care provider.  Keep all follow-up visits as told by your health care provider. This is important. Summary  Robot-assisted laparoscopic surgery is a procedure that allows the surgeon to use robotic arms to control surgical tools and a camera.  Unlike traditional (open) surgery, which involves a large incision, this type of surgery uses a few small incisions.  This type of surgery is often used to treat cancer. It may also be used to remove part or all of an organ.  This type of surgery has many of the same risks as regular, open surgery.  This surgery may result in a shorter hospital stay, a faster recovery time, and less bleeding, pain, and scarring compared to open surgery. This information is not intended to replace advice given to you by your health care provider. Make sure you discuss any questions you have with your health care provider. Document Revised: 03/23/2018 Document Reviewed: 11/24/2016 Elsevier Patient Education  [place].

## 2020-01-29 NOTE — Telephone Encounter (Signed)
Medical Clearance was faxed over to [place] office at [phone]  Cardiac Clearance was faxed over to [place] office at [phone].

## 2020-01-29 NOTE — Progress Notes (Signed)
01/29/2020  History of Present Illness: Jeffrey Guerrero is a 69 y.o. male presenting for follow up of a left inguinal hernia.  He was seen last by Dr. Tama High in 09/2017.  At that time, he had an asymptomatic, reducible left inguinal hernia.  It was decided to proceed with conservative management.  The patient has since noted, starting December 2020, that the hernia is no longer reducible and remains bulging out.  He denies any symptoms but perhaps some discomfort only.  He is unable to reduce it himself and it does not reduce on its own.  He denies any nausea, vomiting, chest pain, shortness of breath, constipation, diarrhea, or urinary issues.  He still works as a Building control surveyor.    Past Medical History: Past Medical History:  Diagnosis Date  . Arthritis    "knees" (02/13/2014)  . 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 Diabetes mellitus without complication ([place])    takes metformin  . Hypertension   . Hypothyroidism   . Pulmonic valve insufficiency 02/16/2014   severe  . S/P pulmonic valve replacement with homograft 11/01/2014  . Thyroid disease      Past Surgical History: Past Surgical History:  Procedure Laterality Date  . AORTIC VALVE REPLACEMENT [name] 11/01/2014   Procedure: PULMONIC HOMOGRAFT VALVE REPLACEMENT ;  Surgeon: Rexene Alberts, MD;  Location: [place];  Service: Open Heart Surgery;  Laterality: [name];  . APPENDECTOMY    . COLONOSCOPY    . COLONOSCOPY WITH PROPOFOL [name] 11/05/2017   Procedure: COLONOSCOPY WITH PROPOFOL;  Surgeon: [name], MD;  Location: [facility] ENDOSCOPY;  Service: Gastroenterology;  Laterality: [name];  . LEFT HEART CATHETERIZATION WITH CORONARY ANGIOGRAM [name] 10/09/2014   Procedure: LEFT HEART CATHETERIZATION WITH CORONARY ANGIOGRAM;  Surgeon: Jettie Booze, MD;  Location[facility] CATH  LAB;  Service: Cardiovascular;  Laterality: [name];  . TEE WITHOUT CARDIOVERSION [name] 02/16/2014   Procedure: TRANSESOPHAGEAL ECHOCARDIOGRAM (TEE);  Surgeon: [name], MD;  Location: [facility] ENDOSCOPY;  Service: Cardiovascular;  Laterality: [name];  . TEE WITHOUT CARDIOVERSION [name] 04/12/2014   Procedure: TRANSESOPHAGEAL ECHOCARDIOGRAM (TEE);  Surgeon: [name], MD;  Location: [place];  Service: Cardiovascular;  Laterality: [name];  . TEE WITHOUT CARDIOVERSION [name] 11/01/2014   Procedure: TRANSESOPHAGEAL ECHOCARDIOGRAM (TEE);  Surgeon: Rexene Alberts, MD;  Location: [place];  Service: Open Heart Surgery;  Laterality: [name];  . TONSILLECTOMY      Home Medications: Prior to Admission medications   Medication Sig Start Date End Date Taking? Authorizing Provider  aspirin 81 MG tablet Take 1 tablet (81 mg total) by mouth daily. 10/06/18  Yes Stallings, Zoe A, MD  atorvastatin (LIPITOR) 20 MG tablet Take 1 tablet (20 mg total) by mouth daily. 10/09/19  Yes Forrest Moron, MD  levothyroxine (SYNTHROID) 100 MCG tablet TAKE 1 TABLET (100 MCG TOTAL) BY MOUTH DAILY BEFORE BREAKFAST. 01/11/20  Yes Stallings, Zoe A, MD  meloxicam (MOBIC) 7.5 MG tablet Take 1 tablet (7.5mg ) by mouth daily as needed with food. No other NSAIDs. 11/21/19  Yes Stallings, Zoe A, MD  metFORMIN (GLUCOPHAGE) 500 MG tablet TAKE 1 TABLET BY MOUTH TWICE A DAY WITH MEALS 11/10/19  Yes Delia Chimes A, MD  Multiple Vitamin (MULTIVITAMIN) tablet Take by mouth.   Yes [provider]  amLODipine (NORVASC) 10 MG tablet Take 1 tablet (10 mg total) by mouth daily.  10/09/19 01/07/20  Forrest Moron, MD    Allergies: No Known Allergies  Review of Systems: Review of Systems  Constitutional: Negative for chills and fever.  HENT: Negative for hearing loss.   Respiratory: Negative for shortness of breath.   Cardiovascular: Negative for chest pain.  Gastrointestinal: Negative for abdominal pain, constipation, diarrhea, nausea and vomiting.   Genitourinary: Negative for dysuria.  Musculoskeletal: Negative for myalgias.  Skin: Negative for rash.  Neurological: Negative for dizziness.  Psychiatric/Behavioral: Negative for depression.    Physical Exam BP 140/86   Pulse 76   Temp (!) 97.3 F (36.3 C) (Temporal)   Ht 6\' 1"  (1.854 m)   Wt 205 lb 6.4 oz (93.2 kg)   SpO2 97%   BMI 27.10 kg/m  CONSTITUTIONAL: No acute distress HEENT:  Normocephalic, atraumatic, extraocular motion intact. RESPIRATORY:  Lungs are clear, and breath sounds are equal bilaterally. Normal respiratory effort without pathologic use of accessory muscles. CARDIOVASCULAR: Heart is regular without murmurs, gallops, or rubs. GI: The abdomen is soft, non-distended, non-tender to palpation.  Despite of a long time trying during this appointment, I was unable to reduce his left inguinal hernia.  However, the patient had no pain or discomfort at any time during attempts at reduction.  The hernia sac is soft and likely contains bowel.  The sac extends to the scrotum.  NEUROLOGIC:  Motor and sensation is grossly normal.  Cranial nerves are grossly intact. PSYCH:  Alert and oriented to person, place and time. Affect is normal.  Labs/Imaging: Labs from 10/09/19: Na 139, K 4.2, Cl 103, CO2 22, BUN 16, Cr 1.17.  LFTs within normal.  HA1c 6.0, PSA 1.1.  Assessment and Plan: This is a 69 y.o. male with an incarcerated left inguinal hernia.  Despite of long attempt at reducing his hernia, it remains incarcerated.  However, it is otherwise asymptomatic with only some discomfort.  There is no evidence of strangulation at all.  However, given the progression, I discussed with the patient that it would be best to proceed with inguinal hernia repair.  Discussed with him the robotic assisted inguinal hernia repair approach, creating a peritoneal flap as part of the dissection and using it to fully cover the mesh, creating a natural tissue barrier for the bowel and peritoneal  cavity.  Discussed that we'd be able to examine the right side as well and repair if any hernia is noted there.  Discussed risks of bleeding, infection, and injury to surrounding structures.  He's willing to proceed.  He understands he would need to stop his Aspirin three days prior.  He will check with his job and see when the best timing for surgery would be.  I did encourage him to schedule sooner rather than later, as I would not want this hernia to progress to a worse scenario.  He will call us back with dates that work for him.  In the meantime, given his cardiac surgery history with pulmonic valve replacement, we'll also send medical and cardiology clearance forms.  Face-to-face time spent with the patient and care providers was 40 minutes, with more than 50% of the time spent counseling, educating, and coordinating care of the patient.     Melvyn Neth, [facility]

## 2020-01-30 ENCOUNTER — Telehealth: Payer: Self-pay | Admitting: Surgery

## 2020-01-30 NOTE — Telephone Encounter (Signed)
Outgoing call, spoke with wife, Mrs. Jeffrey Guerrero, She has been advised of Pre-Admission date/time, COVID Testing date and Surgery date.  Surgery Date: 02/13/20 Preadmission Testing Date: 02/05/20 (phone 1p-5p) Covid Testing Date: 02/09/20 - patient advised to go to the [facility] between 8a-1p   She will also have her husband to call [phone], between 1-3:00pm the day before surgery, to find out what time to arrive for surgery.

## 2020-02-01 ENCOUNTER — Telehealth: Payer: Self-pay | Admitting: Family Medicine

## 2020-02-01 NOTE — Telephone Encounter (Signed)
Jeffrey Racer  Guerrero, patient is scheduled for 02/08/2020 looking for surgical clearance paper work to be faxed  over for this patient .   Any questions please call [phone] any one in scheduling can help

## 2020-02-01 NOTE — Telephone Encounter (Signed)
Spoke with Hoyle Sauer an advised we do have the surgical clearance form, dr Nolon Rod is out of office today but will have her to sign off on paperwork before pt surgery on 02/08/2020.  She is back in office on 02/02/2020.

## 2020-02-01 NOTE — Telephone Encounter (Signed)
   Primary Cardiologist:Paula [name], MD  Chart reviewed as part of pre-operative protocol coverage. Patient has not been seen in the last 6 months. Because of Jeffrey Guerrero's past medical history and time since last visit, he/she will require a follow-up visit in order to better assess preoperative cardiovascular risk.  Pre-op covering staff: - Please schedule appointment and call patient to inform them. - Please contact requesting surgeon's office via preferred method (i.e, phone, fax) to inform them of need for appointment prior to surgery.    Darreld Mclean, PA-C  02/01/2020, 11:25 AM

## 2020-02-01 NOTE — Telephone Encounter (Signed)
DPR ok to s/w pt's wife who has been advised pt needs an appt for pre op clearance. I have scheduled to see Ermalinda Barrios, [facility] 02/06/20 11:15 and to arrive by 11 am with his mask. Pt's wife thanked me for the call and the help. I will forward clearance notes to [facility] for upcoming appt. I will send FYI to the surgeon pt has appt for clearance 02/06/20. I will remove from the pre op call back pool.

## 2020-02-02 ENCOUNTER — Other Ambulatory Visit: Payer: Self-pay | Admitting: Family Medicine

## 2020-02-02 DIAGNOSIS — Z01818 Encounter for other preprocedural examination: Secondary | ICD-10-CM

## 2020-02-05 ENCOUNTER — Encounter: Payer: Self-pay | Admitting: Family Medicine

## 2020-02-05 ENCOUNTER — Encounter
Admission: RE | Admit: 2020-02-05 | Discharge: 2020-02-05 | Disposition: A | Discharge status: Home / Self Care | Payer: BC Managed Care – PPO | Source: Ambulatory Visit | Attending: Surgery | Admitting: Surgery

## 2020-02-05 ENCOUNTER — Ambulatory Visit (INDEPENDENT_AMBULATORY_CARE_PROVIDER_SITE_OTHER): Payer: BC Managed Care – PPO | Admitting: Family Medicine

## 2020-02-05 ENCOUNTER — Other Ambulatory Visit: Payer: Self-pay

## 2020-02-05 VITALS — BP 140/82 | HR 69 | Temp 97.9°F | Resp 16 | Ht 73.0 in | Wt 207.2 lb

## 2020-02-05 DIAGNOSIS — Z01818 Encounter for other preprocedural examination: Secondary | ICD-10-CM | POA: Diagnosis not present

## 2020-02-05 DIAGNOSIS — K4090 Unilateral inguinal hernia, without obstruction or gangrene, not specified as recurrent: Secondary | ICD-10-CM | POA: Diagnosis not present

## 2020-02-05 DIAGNOSIS — E1142 Type 2 diabetes mellitus with diabetic polyneuropathy: Secondary | ICD-10-CM

## 2020-02-05 DIAGNOSIS — Z135 Encounter for screening for eye and ear disorders: Secondary | ICD-10-CM

## 2020-02-05 LAB — POCT URINALYSIS DIP (MANUAL ENTRY)
Bilirubin, UA: NEGATIVE
Blood, UA: NEGATIVE
Glucose, UA: NEGATIVE mg/dL
Ketones, POC UA: NEGATIVE mg/dL
Leukocytes, UA: NEGATIVE
Nitrite, UA: NEGATIVE
Protein Ur, POC: NEGATIVE mg/dL
Spec Grav, UA: 1.015 (ref 1.010–1.025)
Urobilinogen, UA: 0.2 E.U./dL
pH, UA: 5 (ref 5.0–8.0)

## 2020-02-05 NOTE — Patient Instructions (Signed)
Your procedure is scheduled on: 02/13/20 Report to [place]. To find out your arrival time please call [phone] between 1PM - 3PM on 02/12/20.  Remember: Instructions that are not followed completely may result in serious medical risk, up to and including death, or upon the discretion of your surgeon and anesthesiologist your surgery may need to be rescheduled.     _X__ 1. Do not eat food after midnight the night before your procedure.                 No gum chewing or hard candies. You may drink clear liquids up to 2 hours                 before you are scheduled to arrive for your surgery- DO not drink clear                 liquids within 2 hours of the start of your surgery.                 Clear Liquids include:  water, apple juice without pulp, clear carbohydrate                 drink such as Clearfast or Gatorade, Black Coffee or Tea (Do not add                 anything to coffee or tea). Diabetics water only  __X__2.  On the morning of surgery brush your teeth with toothpaste and water, you                 may rinse your mouth with mouthwash if you wish.  Do not swallow any              toothpaste of mouthwash.     _X__ 3.  No Alcohol for 24 hours before or after surgery.   _X__ 4.  Do Not Smoke or use e-cigarettes For 24 Hours Prior to Your Surgery.                 Do not use any chewable tobacco products for at least 6 hours prior to                 surgery.  ____  5.  Bring all medications with you on the day of surgery if instructed.   __X__  6.  Notify your doctor if there is any change in your medical condition      (cold, fever, infections).     Do not wear jewelry, make-up, hairpins, clips or nail polish. Do not wear lotions, powders, or perfumes.  Do not shave 48 hours prior to surgery. Men may shave face and neck. Do not bring valuables to the hospital.    [facility] is not responsible for any belongings or  valuables.  Contacts, dentures/partials or body piercings may not be worn into surgery. Bring a case for your contacts, glasses or hearing aids, a denture cup will be supplied. Leave your suitcase in the car. After surgery it may be brought to your room. For patients admitted to the hospital, discharge time is determined by your treatment team.   Patients discharged the day of surgery will not be allowed to drive home.   Please read over the following fact sheets that you were given:   MRSA Information  __X__ Take these medicines the morning of surgery with A SIP OF WATER:  1. amLODipine (NORVASC) 10 MG tablet  2. atorvastatin (LIPITOR) 20 MG tablet  3. levothyroxine (SYNTHROID) 100 MCG tablet  4.  5.  6.  ____ Fleet Enema (as directed)   __X__ Use CHG Soap/SAGE wipes as directed  ____ Use inhalers on the day of surgery  __X__ Stop metformin 2 days prior to surgery    ____ Take 1/2 of usual insulin dose the night before surgery. No insulin the morning          of surgery.   __X__ Stop Blood Thinners   Aspirin  3 DAYS BEFORE SURGERY AS INSTRUCTED  __X__ Stop Anti-inflammatories 7 days before surgery  MELOXICAM   __X__ Stop all herbal supplements, fish oil or vitamin E until after surgery.    ____ Bring C-Pap to the hospital.

## 2020-02-05 NOTE — Pre-Procedure Instructions (Addendum)
Clearance request sent to PCP office from surgeon's office. Secure chat to Irena Reichmann to forward to Pre Admit Testing once received.   Seen by PCP today 02/05/20. EKG within patient normal per notes. Waiting lab results to give final Medical clearance. Patient has appointment with cardiology 02/06/20.

## 2020-02-05 NOTE — Progress Notes (Signed)
Established Patient Office Visit  Subjective:  Patient ID: Jeffrey Guerrero, male    DOB: January 19, 1951  Age: 69 y.o. MRN: [id number]  CC:  Chief Complaint  Patient presents with  . surgical clearance/labs/EKG    HPI [name] presents for   Left inguinal hernia repair preop clearance  Pt reports that he has surgery set up for a week from now. He has a cardiac clearance tomorrow with his heart doctor. He denies any chest pains, fevers or chills. No history of difficulty with anesthesia. He has no urinary discomfort or changes to his BM He state that he has discomfort from his hernia getting bigger on the left.  It feels heavy to him.    Past Medical History:  Diagnosis Date  . Arthritis    "knees" (02/13/2014)  . 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 Diabetes mellitus without complication ([place])    takes metformin  . Hypertension   . Hypothyroidism   . Pulmonic valve insufficiency 02/16/2014   severe  . S/P pulmonic valve replacement with homograft 11/01/2014  . Thyroid disease     Past Surgical History:  Procedure Laterality Date  . AORTIC VALVE REPLACEMENT [name] 11/01/2014   Procedure: PULMONIC HOMOGRAFT VALVE REPLACEMENT ;  Surgeon: Rexene Alberts, MD;  Location: [facility];  Service: Open Heart Surgery;  Laterality: [name];  . APPENDECTOMY    . COLONOSCOPY    . COLONOSCOPY WITH PROPOFOL [name] 11/05/2017   Procedure: COLONOSCOPY WITH PROPOFOL;  Surgeon: [name], MD;  Location: [facility] ENDOSCOPY;  Service: Gastroenterology;  Laterality: [name];  . LEFT HEART CATHETERIZATION WITH CORONARY ANGIOGRAM [name] 10/09/2014   Procedure: LEFT HEART CATHETERIZATION WITH CORONARY ANGIOGRAM;  Surgeon: Jettie Booze, MD;  Location: [facility] CATH LAB;  Service: Cardiovascular;  Laterality: [name];  . TEE WITHOUT  CARDIOVERSION [name] 02/16/2014   Procedure: TRANSESOPHAGEAL ECHOCARDIOGRAM (TEE);  Surgeon: [name], MD;  Location: [facility] ENDOSCOPY;  Service: Cardiovascular;  Laterality: [name];  . TEE WITHOUT CARDIOVERSION [name] 04/12/2014   Procedure: TRANSESOPHAGEAL ECHOCARDIOGRAM (TEE);  Surgeon: [name], MD;  Location: [facility];  Service: Cardiovascular;  Laterality: [name];  . TEE WITHOUT CARDIOVERSION [name] 11/01/2014   Procedure: TRANSESOPHAGEAL ECHOCARDIOGRAM (TEE);  Surgeon: Rexene Alberts, MD;  Location: [place];  Service: Open Heart Surgery;  Laterality: [name];  . TONSILLECTOMY      Family History  Problem Relation Age of Onset  . Heart disease Neg Hx     Social History   Socioeconomic History  . Marital status: Married    Spouse name: Not on file  . Number of children: Not on file  . Years of education: Not on file  . Highest education level: Not on file  Occupational History  . Occupation: Welder  Tobacco Use  . Smoking status: Never Smoker  . Smokeless tobacco: Never Used  Substance and Sexual Activity  . Alcohol use: Yes    Comment: occasional  . Drug use: No  . Sexual activity: Yes  Other Topics Concern  . Not on file  Social History Narrative   Married   Social Determinants of Health   Financial Resource Strain:   . Difficulty of Paying Living Expenses:   Food Insecurity:   . Worried About Charity fundraiser in the Last Year:   . [place] in the Last Year:  Transportation Needs:   . Film/video editor (Medical):   Marland Kitchen Lack of Transportation (Non-Medical):   Physical Activity:   . Days of Exercise per Week:   . Minutes of Exercise per Session:   Stress:   . Feeling of Stress :   Social Connections:   . Frequency of Communication with Friends and Family:   . Frequency of Social Gatherings with Friends and Family:   . Attends Religious Services:   . Active Member of Clubs or Organizations:   . Attends Archivist Meetings:   Marland Kitchen Marital Status:     Intimate Partner Violence:   . Fear of Current or Ex-Partner:   . Emotionally Abused:   Marland Kitchen Physically Abused:   . Sexually Abused:     Outpatient Medications Prior to Visit  Medication Sig Dispense Refill  . amLODipine (NORVASC) 10 MG tablet Take 1 tablet (10 mg total) by mouth daily. 90 tablet 3  . aspirin 81 MG tablet Take 1 tablet (81 mg total) by mouth daily. 90 tablet 3  . atorvastatin (LIPITOR) 20 MG tablet Take 1 tablet (20 mg total) by mouth daily. 90 tablet 3  . cholecalciferol (VITAMIN D3) 25 MCG (1000 UNIT) tablet Take 1,000 Units by mouth daily.    . cyanocobalamin 2000 MCG tablet Take 2,000 mcg by mouth at bedtime.    . folic acid (FOLVITE) [id number] MCG tablet Take 400 mcg by mouth at bedtime.    Marland Kitchen levothyroxine (SYNTHROID) 100 MCG tablet TAKE 1 TABLET (100 MCG TOTAL) BY MOUTH DAILY BEFORE BREAKFAST. 90 tablet 1  . meloxicam (MOBIC) 7.5 MG tablet Take 1 tablet (7.5mg ) by mouth daily as needed with food. No other NSAIDs. (Patient taking differently: Take 7.5 mg by mouth daily as needed for pain. ) 90 tablet 1  . metFORMIN (GLUCOPHAGE) 500 MG tablet TAKE 1 TABLET BY MOUTH TWICE A DAY WITH MEALS (Patient taking differently: Take 500 mg by mouth 2 (two) times daily with a meal. ) 60 tablet 11  . pyridOXINE (VITAMIN B-6) 100 MG tablet Take 100 mg by mouth at bedtime.     No facility-administered medications prior to visit.    No Known Allergies  ROS Review of Systems Review of Systems  Constitutional: Negative for activity change, appetite change, chills and fever.  HENT: Negative for congestion, nosebleeds, trouble swallowing and voice change.   Respiratory: Negative for cough, shortness of breath and wheezing.   Gastrointestinal: Negative for diarrhea, nausea and vomiting.  Genitourinary: Negative for difficulty urinating, dysuria, flank pain and hematuria.  Musculoskeletal: Negative for back pain, joint swelling and neck pain.  Neurological: Negative for dizziness, speech  difficulty, light-headedness and numbness.  See HPI. All other review of systems negative.     Objective:    Physical Exam  BP 140/82 (BP Location: Left Arm, Patient Position: Sitting, Cuff Size: Large)   Pulse 69   Temp 97.9 F (36.6 C) (Temporal)   Resp 16   Ht 6\' 1"  (1.854 m)   Wt 207 lb 3.2 oz (94 kg)   SpO2 99%   BMI 27.34 kg/m  Wt Readings from Last 3 Encounters:  02/05/20 207 lb 3.2 oz (94 kg)  01/29/20 205 lb 6.4 oz (93.2 kg)  10/09/19 198 lb 6.4 oz (90 kg)   Physical Exam  Constitutional: Oriented to person, place, and time. Appears well-developed and well-nourished.  HENT:  Head: Normocephalic and atraumatic.  Eyes: Conjunctivae and EOM are normal.  Ears: TM clear, external canal normal Cardiovascular:  Normal rate, regular rhythm, normal heart sounds and intact distal pulses.  No murmur heard. Pulmonary/Chest: Effort normal and breath sounds normal. No stridor. No respiratory distress. Has no wheezes.  Abdomen: nondistended, normoactive bs, soft, nontender Neurological: Is alert and oriented to person, place, and time.  Skin: Skin is warm. Capillary refill takes less than 2 seconds.  Psychiatric: Has a normal mood and affect. Behavior is normal. Judgment and thought content normal.    ECG - NSR, RBBB, 1st degree AV block  Health Maintenance Due  Topic Date Due  . OPHTHALMOLOGY EXAM  Never done  . COVID-19 Vaccine (1) Never done  . FOOT EXAM  10/07/2019    There are no preventive care reminders to display for this patient.  Lab Results  Component Value Date   TSH 3.060 10/06/2018   Lab Results  Component Value Date   WBC 3.3 (L) 09/18/2015   HGB 14.0 09/18/2015   HCT 42.2 09/18/2015   MCV 89.6 09/18/2015   PLT 204 09/18/2015   Lab Results  Component Value Date   NA 139 10/09/2019   K 4.2 10/09/2019   CO2 22 10/09/2019   GLUCOSE 116 (H) 10/09/2019   BUN 16 10/09/2019   CREATININE 1.17 10/09/2019   BILITOT 0.6 10/09/2019   ALKPHOS 60  10/09/2019   AST 21 10/09/2019   ALT 21 10/09/2019   PROT 7.2 10/09/2019   ALBUMIN 4.3 10/09/2019   CALCIUM 9.4 10/09/2019   ANIONGAP 8 11/03/2014   GFR 104.74 11/21/2014   Lab Results  Component Value Date   CHOL 146 05/22/2019   Lab Results  Component Value Date   HDL 48 05/22/2019   Lab Results  Component Value Date   LDLCALC 78 05/22/2019   Lab Results  Component Value Date   TRIG 100 05/22/2019   Lab Results  Component Value Date   CHOLHDL 3.0 05/22/2019   Lab Results  Component Value Date   HGBA1C 6.0 (A) 10/09/2019      Assessment & Plan:   Problem List Items Addressed This Visit    None    Visit Diagnoses    Encounter for preoperative examination for general surgical procedure    -  Primary   Relevant Orders   EKG 12-Lead (Completed)   Preop testing          Will review labs and provide medical clearance There are no current contraindications ECG unchanged compared to previous, has cardiology clearance  No orders of the defined types were placed in this encounter.   Follow-up: No follow-ups on file.   A total of 25 minutes were spent face-to-face with the patient during this encounter and over half of that time was spent on counseling and coordination of care.  Forrest Moron, MD

## 2020-02-05 NOTE — Patient Instructions (Addendum)
     If you have lab work done today you will be contacted with your lab results within the next 2 weeks.  If you have not heard from [place] then please contact us. The fastest way to get your results is to register for My Chart.   IF you received an x-ray today, you will receive an invoice from [facility]. Please contact [facility] at [phone] with questions or concerns regarding your invoice.   IF you received labwork today, you will receive an invoice from [place]. Please contact [facility] at [phone] with questions or concerns regarding your invoice.   Our billing staff will not be able to assist you with questions regarding bills from these companies.  You will be contacted with the lab results as soon as they are available. The fastest way to get your results is to activate your My Chart account. Instructions are located on the last page of this paperwork. If you have not heard from [place] regarding the results in 2 weeks, please contact this office.     Medical Screening Exam  A medical screening exam helps determine whether or not you need immediate medical treatment. This type of exam may be done in the emergency department, an urgent care setting, or your health care provider's office. During the exam, a health care provider does a short physical exam and asks about your medical history to assess:  Your current symptoms.  Your overall health. Depending on your symptoms, you may need additional tests. What are the possible outcomes of a medical screening exam? Your medical screening exam may determine that:  You do not need emergency treatment at this time.  You need treatment right away.  You need to be transferred to another medical center.  You need to have more tests. A medical specialist may be consulted if necessary. When should I seek medical care? If you have a regular health care provider, make an appointment for a follow-up visit with him or  her. If you do not have a regular health care provider, ask about resources in your community. Get help right away if:  Your condition gets worse or you develop new or troubling symptoms before you see your health care provider. If this occurs, go to an emergency department right away. In an emergency:  Call 911 or have someone drive you to the nearest hospital.  Do not drive yourself. Summary  A medical screening exam helps determine whether or not you need immediate medical treatment.  During the exam, a health care provider does a short physical exam and asks about your current symptoms and overall health.  More tests may be ordered during the exam.  You may need to be transferred to another medical center. This information is not intended to replace advice given to you by your health care provider. Make sure you discuss any questions you have with your health care provider. Document Revised: 01/27/2019 Document Reviewed: 05/03/2018 Elsevier Patient Education  [employer].

## 2020-02-05 NOTE — Progress Notes (Addendum)
Cardiology Office Note    Date:  02/06/2020   ID:  [name], DOB 1951/04/02, MRN [id number]  PCP:  Forrest Moron, MD  Cardiologist: Dorris Carnes, MD EPS: None  Chief Complaint  Patient presents with  . Pre-op Exam    History of Present Illness:  Jeffrey Guerrero is a 69 y.o. male with history of pulmonic valve endocarditis with strep pneumonia sepsis 0000000 complicated by bilateral septic pulmonary emboli, severe pulmonic regurgitation status post homograft PV replacement 10/2014, hypertension, DM type II, hypothyroidism.  Cardiac catheterization 2015 minimal small atherosclerotic disease in the LAD and left circumflex and its branches mild disease in the RCA, ejection fraction 60%.  Patient comes in today for cardiac clearance for robotic assisted inguinal hernia repair by Dr. Hampton Abbot 4/27/202. Denies chest pain, palpitations, dyspnea, dyspnea on exertion, edema, dizziness or presyncope. Works as a Building control surveyor. Stays active, walks 20 min every other day,  does sit ups and push ups. Has received covid 19 [employer] vaccine.      Past Medical History:  Diagnosis Date  . Arthritis    "knees" (02/13/2014)  . 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 Diabetes mellitus without complication ([place])    takes metformin  . Hypertension   . Hypothyroidism   . Pulmonic valve insufficiency 02/16/2014   severe  . S/P pulmonic valve replacement with homograft 11/01/2014  . Thyroid disease     Past Surgical History:  Procedure Laterality Date  . AORTIC VALVE REPLACEMENT [name] 11/01/2014   Procedure: PULMONIC HOMOGRAFT VALVE REPLACEMENT ;  Surgeon: Rexene Alberts, MD;  Location: [place];  Service: Open Heart Surgery;  Laterality: [name];  . APPENDECTOMY    . COLONOSCOPY    . COLONOSCOPY WITH PROPOFOL [name] 11/05/2017    Procedure: COLONOSCOPY WITH PROPOFOL;  Surgeon: [name], MD;  [facility] ENDOSCOPY;  Service: Gastroenterology;  Laterality: [name];  . LEFT HEART CATHETERIZATION WITH CORONARY ANGIOGRAM [name] 10/09/2014   Procedure: LEFT HEART CATHETERIZATION WITH CORONARY ANGIOGRAM;  Surgeon: Jettie Booze, MD;  Location: [facility] CATH LAB;  Service: Cardiovascular;  Laterality: [name];  . TEE WITHOUT CARDIOVERSION [name] 02/16/2014   Procedure: TRANSESOPHAGEAL ECHOCARDIOGRAM (TEE);  Surgeon: [name], MD;  Location: [facility] ENDOSCOPY;  Service: Cardiovascular;  Laterality: [name];  . TEE WITHOUT CARDIOVERSION [name] 04/12/2014   Procedure: TRANSESOPHAGEAL ECHOCARDIOGRAM (TEE);  Surgeon: [name], MD;  Location: [place];  Service: Cardiovascular;  Laterality: [name];  . TEE WITHOUT CARDIOVERSION [name] 11/01/2014   Procedure: TRANSESOPHAGEAL ECHOCARDIOGRAM (TEE);  Surgeon: Rexene Alberts, MD;  Location: [place];  Service: Open Heart Surgery;  Laterality: [name];  . TONSILLECTOMY      Current Medications: Current Meds  Medication Sig  . amLODipine (NORVASC) 10 MG tablet Take 1 tablet (10 mg total) by mouth daily.  Marland Kitchen aspirin 81 MG tablet Take 1 tablet (81 mg total) by mouth daily.  Marland Kitchen atorvastatin (LIPITOR) 20 MG tablet Take 1 tablet (20 mg total) by mouth daily.  . cholecalciferol (VITAMIN D3) 25 MCG (1000 UNIT) tablet Take 1,000 Units by mouth daily.  . cyanocobalamin 2000 MCG tablet Take 2,000 mcg by mouth at bedtime.  . folic acid (FOLVITE) Q000111Q MCG tablet Take 400 mcg by mouth at bedtime.  Marland Kitchen levothyroxine (SYNTHROID) 100 MCG tablet TAKE 1 TABLET (100 MCG TOTAL) BY MOUTH DAILY BEFORE BREAKFAST.  Marland Kitchen  meloxicam (MOBIC) 7.5 MG tablet Take 1 tablet (7.5mg ) by mouth daily as needed with food. No other NSAIDs.  . metFORMIN (GLUCOPHAGE) 500 MG tablet TAKE 1 TABLET BY MOUTH TWICE A DAY WITH MEALS  . pyridOXINE (VITAMIN B-6) 100 MG tablet Take 100 mg by mouth at bedtime.     Allergies:   Patient has no known allergies.   Social  History   Socioeconomic History  . Marital status: Married    Spouse name: Not on file  . Number of children: Not on file  . Years of education: Not on file  . Highest education level: Not on file  Occupational History  . Occupation: Welder  Tobacco Use  . Smoking status: Never Smoker  . Smokeless tobacco: Never Used  Substance and Sexual Activity  . Alcohol use: Yes    Comment: occasional  . Drug use: No  . Sexual activity: Yes  Other Topics Concern  . Not on file  Social History Narrative   Married   Social Determinants of Health   Financial Resource Strain:   . Difficulty of Paying Living Expenses:   Food Insecurity:   . Worried About Charity fundraiser in the Last Year:   . Arboriculturist in the Last Year:   Transportation Needs:   . Film/video editor (Medical):   Marland Kitchen Lack of Transportation (Non-Medical):   Physical Activity:   . Days of Exercise per Week:   . Minutes of Exercise per Session:   Stress:   . Feeling of Stress :   Social Connections:   . Frequency of Communication with Friends and Family:   . Frequency of Social Gatherings with Friends and Family:   . Attends Religious Services:   . Active Member of Clubs or Organizations:   . Attends Archivist Meetings:   Marland Kitchen Marital Status:      Family History:  The patient's family history is not on file.   ROS:   Please see the history of present illness.    ROS All other systems reviewed and are negative.   PHYSICAL EXAM:   VS:  BP (!) 148/80   Pulse 75   Ht 6\' 1"  (1.854 m)   Wt 207 lb 12.8 oz (94.3 kg)   SpO2 97%   BMI 27.42 kg/m   Physical Exam  GEN: Well nourished, well developed, in no acute distress  Neck: no JVD, carotid bruits, or masses Cardiac:RRR; 2/6 systolic murmur LSB Respiratory:  Decreased breath sounds at bases otherwise clear to auscultation bilaterally, normal work of breathing GI: soft, nontender, nondistended, + BS Ext: trace edema otherwise without cyanosis,  clubbing, or edema, Good distal pulses bilaterally Neuro:  Alert and Oriented x 3 Psych: euthymic mood, full affect  Wt Readings from Last 3 Encounters:  02/06/20 207 lb 12.8 oz (94.3 kg)  02/05/20 207 lb (93.9 kg)  02/05/20 207 lb 3.2 oz (94 kg)      Studies/Labs Reviewed:   EKG:  EKG is not ordered today.  The ekg reviewed from yest NSR first degree AV block RBBB, PVC's. No change Recent Labs: 10/09/2019: ALT 21 02/05/2020: BUN 15; Creatinine, Ser 1.03; Hemoglobin 16.0; Platelets 200; Potassium 4.2; Sodium 141   Lipid Panel    Component Value Date/Time   CHOL 146 05/22/2019 1650   TRIG 100 05/22/2019 1650   HDL 48 05/22/2019 1650   CHOLHDL 3.0 05/22/2019 1650   CHOLHDL 4.0 09/18/2015 1333   VLDL 14 09/18/2015 1333  [place] 78 05/22/2019 1650    Additional studies/ records that were reviewed today include:    Echocardiogram 11/15/2015 Study Conclusions   - Left ventricle: The cavity size was normal. Wall thickness was    increased in a pattern of mild LVH. Systolic function was normal.    The estimated ejection fraction was in the range of 60% to 65%.    Features are consistent with a pseudonormal left ventricular    filling pattern, with concomitant abnormal relaxation and    increased filling pressure (grade 2 diastolic dysfunction).  - Right ventricle: No significant change from echo of Feb 2016. The    cavity size was moderately dilated. Systolic function was mildly    reduced.  - Pulmonic valve: PV prosthesis appears to open well Peak and mean    gradients through the valve are 22 and 12 mm Hg This is mildly    increased from gradients in echo of 2016 (18 and 9 mm Hg)   -------------------------------------------------------------------  Labs, prior tests, procedures, and surgery:  Transthoracic echocardiography (11/22/2014).    The pulmonic valve  showed mild regurgitation.  EF was 65%. Pulmonic valve: peak  gradient of 18 mm Hg and mean gradient of 9 mm Hg.    Transthoracic echocardiography.  M-mode, limited 2D, limited  spectral Doppler, and color Doppler.  Birthdate:  Patient  birthdate: [birth year].  Age:  Patient is 69 yr old.  Sex:  Gender:  male.    BMI: 26.9 kg/m^2.  Blood pressure:     159/95  Patient  status:  Outpatient.  Study date:  Study date: 11/15/2015. Study  time: 03:57 PM.  Location:  [facility]     Pre-CABG Dopplers 10/29/2014 - Bilateral -1% to 39% ICA stenosis. Vertebral artery flow is    antegrade.  - Palmar arch evaluation - Doppler waveforms on the right remained    normal with radial compression and diminished greater than 50%    with ulnar compression. Left Doppler waveforms remained normal    with both radial and ulnar compressions.    Cardiac catheterization 10/09/2014 Widely patent left main coronary artery. Minimal atherosclerotic disease in the  left anterior descending artery and its branches. Minimal atherosclerotic disease in the  left circumflex artery and its branches. Mild disease in the  right coronary artery. Normal left ventricular systolic function.  LVEDP 8 mmHg.  Ejection fraction 60%.       ASSESSMENT:    1. Preoperative cardiovascular examination   2. S/P pulmonic valve replacement with homograft   3. Bacterial endocarditis, unspecified chronicity   4. History of pulmonary embolus (PE)   5. Essential hypertension      PLAN:  In order of problems listed above:  Preoperative clearance before undergoing inguinal hernia repair by Dr. [place] Abbot 02/13/2020.  Patient without cardiac symptoms and low risk surgery. No work up needed prior to hernia repair. Will update echo. According to the Revised Cardiac Risk Index (RCRI), his Perioperative Risk of Major Cardiac Event is (%): 0.9  His Functional Capacity in METs is: 5.72 according to the [facility] Activity Status Index (DASI).  Addendum: reviewed with Dr. [name] who recommends Amoxicillin 1 gm prior to procedure.  Status post pulmonic  valve replacement with homograft in 2016 for SBE.  Continue SBE prophylaxis before dental procedures-no teeth and doesn't wear dentures.   History of bacterial endocarditis pulmonic valve vegetation with strep pneumonia sepsis complicated by pulmonary embolus will update echo  History of pulmonary embolus in setting  of endocarditis.  Essential hypertension BP controlled    Medication Adjustments/Labs and Tests Ordered: Current medicines are reviewed at length with the patient today.  Concerns regarding medicines are outlined above.  Medication changes, Labs and Tests ordered today are listed in the Patient Instructions below. Patient Instructions  Medication Instructions:  Your physician recommends that you continue on your current medications as directed. Please refer to the Current Medication list given to you today.  *If you need a refill on your cardiac medications before your next appointment, please call your pharmacy*   Lab Work: None ordered  If you have labs (blood work) drawn today and your tests are completely normal, you will receive your results only by: Marland Kitchen MyChart Message (if you have MyChart) OR . A paper copy in the mail If you have any lab test that is abnormal or we need to change your treatment, we will call you to review the results.   Testing/Procedures: Your physician has requested that you have an echocardiogram. Echocardiography is a painless test that uses sound waves to create images of your heart. It provides your doctor with information about the size and shape of your heart and how well your heart's chambers and valves are working. This procedure takes approximately one hour. There are no restrictions for this procedure.  Follow-Up: At [facility], you and your health needs are our priority.  As part of our continuing mission to provide you with exceptional heart care, we have created designated Provider Care Teams.  These Care Teams include your primary  Cardiologist (physician) and Advanced Practice Providers (APPs -  Physician Assistants and Nurse Practitioners) who all work together to provide you with the care you need, when you need it.  We recommend signing up for the patient portal called "MyChart".  Sign up information is provided on this After Visit Summary.  MyChart is used to connect with patients for Virtual Visits (Telemedicine).  Patients are able to view lab/test results, encounter notes, upcoming appointments, etc.  Non-urgent messages can be sent to your provider as well.   To learn more about what you can do with MyChart, go to [email or web address].    Your next appointment:   6 month(s)  The format for your next appointment:   In Person  Provider:   You may see Dorris Carnes, MD or one of the following Advanced Practice Providers on your designated Care Team:    Richardson Dopp, PA-C  [place], [place]  Daune Perch, NP    Other Instructions      Signed, Ermalinda Barrios, Hershal Coria  02/06/2020 11:41 AM    [address], [place], [place]  [address] Phone: [phone]; Fax: [phone]

## 2020-02-06 ENCOUNTER — Encounter: Payer: Self-pay | Admitting: Family Medicine

## 2020-02-06 ENCOUNTER — Encounter: Payer: Self-pay | Admitting: Physician Assistant

## 2020-02-06 ENCOUNTER — Ambulatory Visit: Payer: BC Managed Care – PPO | Admitting: Physician Assistant

## 2020-02-06 ENCOUNTER — Other Ambulatory Visit: Payer: Self-pay | Admitting: Family Medicine

## 2020-02-06 VITALS — BP 148/80 | HR 75 | Ht 73.0 in | Wt 207.8 lb

## 2020-02-06 DIAGNOSIS — Z86711 Personal history of pulmonary embolism: Secondary | ICD-10-CM | POA: Diagnosis not present

## 2020-02-06 DIAGNOSIS — I330 Acute and subacute infective endocarditis: Secondary | ICD-10-CM

## 2020-02-06 DIAGNOSIS — I10 Essential (primary) hypertension: Secondary | ICD-10-CM

## 2020-02-06 DIAGNOSIS — Z01810 Encounter for preprocedural cardiovascular examination: Secondary | ICD-10-CM

## 2020-02-06 DIAGNOSIS — Z954 Presence of other heart-valve replacement: Secondary | ICD-10-CM

## 2020-02-06 LAB — BASIC METABOLIC PANEL
BUN/Creatinine Ratio: 15 (ref 10–24)
BUN: 15 mg/dL (ref 8–27)
CO2: 21 mmol/L (ref 20–29)
Calcium: 9.9 mg/dL (ref 8.6–10.2)
Chloride: 105 mmol/L (ref 96–106)
Creatinine, Ser: 1.03 mg/dL (ref 0.76–1.27)
GFR calc Af Amer: 85 mL/min/{1.73_m2} (ref >59)
GFR calc non Af Amer: 73 mL/min/{1.73_m2} (ref >59)
Glucose: 128 mg/dL — ABNORMAL HIGH (ref 65–99)
Potassium: 4.2 mmol/L (ref 3.5–5.2)
Sodium: 141 mmol/L (ref 134–144)

## 2020-02-06 LAB — PROTIME-INR

## 2020-02-06 LAB — CBC
Hematocrit: 47.9 % (ref 37.5–51.0)
Hemoglobin: 16 g/dL (ref 13.0–17.7)
MCH: 30.7 pg (ref 26.6–33.0)
MCHC: 33.4 g/dL (ref 31.5–35.7)
MCV: 92 fL (ref 79–97)
Platelets: 200 10*3/uL (ref 150–450)
RBC: 5.21 x10E6/uL (ref 4.14–5.80)
RDW: 13.4 % (ref 11.6–15.4)
WBC: 4.3 10*3/uL (ref 3.4–10.8)

## 2020-02-06 MED ORDER — AMOXICILLIN 250 MG PO CAPS: 1000.0000 mg | ORAL_CAPSULE | ORAL | Status: DC

## 2020-02-06 NOTE — Progress Notes (Signed)
Left message for patient to call back  

## 2020-02-06 NOTE — Addendum Note (Signed)
Addended by: Riki Sheer on: 02/06/2020 09:41 PM   Modules accepted: Orders

## 2020-02-06 NOTE — Patient Instructions (Signed)
Medication Instructions:  Your physician recommends that you continue on your current medications as directed. Please refer to the Current Medication list given to you today.  *If you need a refill on your cardiac medications before your next appointment, please call your pharmacy*   Lab Work: None ordered  If you have labs (blood work) drawn today and your tests are completely normal, you will receive your results only by: Marland Kitchen MyChart Message (if you have MyChart) OR . A paper copy in the mail If you have any lab test that is abnormal or we need to change your treatment, we will call you to review the results.   Testing/Procedures: Your physician has requested that you have an echocardiogram. Echocardiography is a painless test that uses sound waves to create images of your heart. It provides your doctor with information about the size and shape of your heart and how well your heart's chambers and valves are working. This procedure takes approximately one hour. There are no restrictions for this procedure.  Follow-Up: At [facility], you and your health needs are our priority.  As part of our continuing mission to provide you with exceptional heart care, we have created designated Provider Care Teams.  These Care Teams include your primary Cardiologist (physician) and Advanced Practice Providers (APPs -  Physician Assistants and Nurse Practitioners) who all work together to provide you with the care you need, when you need it.  We recommend signing up for the patient portal called "MyChart".  Sign up information is provided on this After Visit Summary.  MyChart is used to connect with patients for Virtual Visits (Telemedicine).  Patients are able to view lab/test results, encounter notes, upcoming appointments, etc.  Non-urgent messages can be sent to your provider as well.   To learn more about what you can do with MyChart, go to [email or web address].    Your next appointment:   6  month(s)  The format for your next appointment:   In Person  Provider:   You may see Dorris Carnes, MD or one of the following Advanced Practice Providers on your designated Care Team:    Richardson Dopp, PA-C  [place], [place]  Daune Perch, NP    Other Instructions

## 2020-02-06 NOTE — Progress Notes (Signed)
Received Medical clearance from Dr Nolon Rod office. The patient is cleared at Low risk for surgery.

## 2020-02-06 NOTE — Progress Notes (Signed)
Cardiac Clearance received from Dr Alan Ripper office. The patient is cleared at Low risk for surgery. Notes are in Epic.

## 2020-02-07 NOTE — Progress Notes (Signed)
Per Ermalinda Barrios, PA they will give the patient his RX for amoxicillin for SBE prophylaxis at his pre-op appointment.

## 2020-02-07 NOTE — Progress Notes (Signed)
Left another message for patient to call back 

## 2020-02-08 ENCOUNTER — Telehealth: Payer: Self-pay | Admitting: Surgery

## 2020-02-08 NOTE — Telephone Encounter (Signed)
Per Dr Hampton Abbot patient will be given antibiotics in pre op.

## 2020-02-08 NOTE — Telephone Encounter (Signed)
Pt called advising he received a call from Dr. [name], his cardiologist, who requested the pt start an abx prior to sx w/Dr. Hampton Abbot (currently sch'd for 02/13/20).    Pt confirmed pharmacy as [facility] in [place], [place] & is best reached @ wk [phone]) for any further questions or concerns,  Thank you

## 2020-02-09 ENCOUNTER — Other Ambulatory Visit
Admission: RE | Admit: 2020-02-09 | Discharge: 2020-02-09 | Disposition: A | Discharge status: Home / Self Care | Payer: BC Managed Care – PPO | Source: Ambulatory Visit | Attending: Surgery | Admitting: Surgery

## 2020-02-09 DIAGNOSIS — Z01812 Encounter for preprocedural laboratory examination: Secondary | ICD-10-CM | POA: Insufficient documentation

## 2020-02-09 DIAGNOSIS — Z20822 Contact with and (suspected) exposure to covid-19: Secondary | ICD-10-CM | POA: Diagnosis not present

## 2020-02-09 LAB — SARS CORONAVIRUS 2 (TAT 6-24 HRS): SARS Coronavirus 2: NEGATIVE

## 2020-02-12 MED ORDER — CEFAZOLIN SODIUM-DEXTROSE 2-4 GM/100ML-% IV SOLN
2.0000 g | INTRAVENOUS | Status: AC
  Administered 2020-02-13 (×2): 2 g via INTRAVENOUS

## 2020-02-13 ENCOUNTER — Ambulatory Visit: Payer: BC Managed Care – PPO | Admitting: Anesthesiology

## 2020-02-13 ENCOUNTER — Encounter
Admission: RE | Disposition: A | Discharge status: Home / Self Care | Payer: Self-pay | Source: Ambulatory Visit | Attending: Surgery

## 2020-02-13 ENCOUNTER — Encounter: Payer: Self-pay | Admitting: Surgery

## 2020-02-13 ENCOUNTER — Other Ambulatory Visit: Payer: Self-pay

## 2020-02-13 ENCOUNTER — Observation Stay
Admission: RE | Admit: 2020-02-13 | Discharge: 2020-02-14 | Disposition: A | Discharge status: Home / Self Care | Payer: BC Managed Care – PPO | Source: Ambulatory Visit | Attending: Surgery | Admitting: Surgery

## 2020-02-13 DIAGNOSIS — Z791 Long term (current) use of non-steroidal anti-inflammatories (NSAID): Secondary | ICD-10-CM | POA: Insufficient documentation

## 2020-02-13 DIAGNOSIS — Z952 Presence of prosthetic heart valve: Secondary | ICD-10-CM | POA: Insufficient documentation

## 2020-02-13 DIAGNOSIS — Z86711 Personal history of pulmonary embolism: Secondary | ICD-10-CM | POA: Diagnosis not present

## 2020-02-13 DIAGNOSIS — K4030 Unilateral inguinal hernia, with obstruction, without gangrene, not specified as recurrent: Secondary | ICD-10-CM | POA: Diagnosis not present

## 2020-02-13 DIAGNOSIS — K4031 Unilateral inguinal hernia, with obstruction, without gangrene, recurrent: Secondary | ICD-10-CM | POA: Diagnosis not present

## 2020-02-13 DIAGNOSIS — E1140 Type 2 diabetes mellitus with diabetic neuropathy, unspecified: Secondary | ICD-10-CM | POA: Diagnosis not present

## 2020-02-13 DIAGNOSIS — Z79899 Other long term (current) drug therapy: Secondary | ICD-10-CM | POA: Diagnosis not present

## 2020-02-13 DIAGNOSIS — G709 Myoneural disorder, unspecified: Secondary | ICD-10-CM | POA: Insufficient documentation

## 2020-02-13 DIAGNOSIS — E039 Hypothyroidism, unspecified: Secondary | ICD-10-CM | POA: Diagnosis not present

## 2020-02-13 DIAGNOSIS — I10 Essential (primary) hypertension: Secondary | ICD-10-CM | POA: Diagnosis not present

## 2020-02-13 DIAGNOSIS — Z7984 Long term (current) use of oral hypoglycemic drugs: Secondary | ICD-10-CM | POA: Diagnosis not present

## 2020-02-13 DIAGNOSIS — E119 Type 2 diabetes mellitus without complications: Secondary | ICD-10-CM | POA: Diagnosis not present

## 2020-02-13 DIAGNOSIS — M170 Bilateral primary osteoarthritis of knee: Secondary | ICD-10-CM | POA: Diagnosis not present

## 2020-02-13 DIAGNOSIS — Z01810 Encounter for preprocedural cardiovascular examination: Secondary | ICD-10-CM | POA: Diagnosis not present

## 2020-02-13 DIAGNOSIS — K219 Gastro-esophageal reflux disease without esophagitis: Secondary | ICD-10-CM | POA: Diagnosis not present

## 2020-02-13 DIAGNOSIS — Z7982 Long term (current) use of aspirin: Secondary | ICD-10-CM | POA: Diagnosis not present

## 2020-02-13 HISTORY — PX: XI ROBOTIC ASSISTED INGUINAL HERNIA REPAIR WITH MESH: SHX6706

## 2020-02-13 LAB — GLUCOSE, CAPILLARY
Glucose-Capillary: 108 mg/dL — ABNORMAL HIGH (ref 70–99)
Glucose-Capillary: 154 mg/dL — ABNORMAL HIGH (ref 70–99)
Glucose-Capillary: 190 mg/dL — ABNORMAL HIGH (ref 70–99)

## 2020-02-13 SURGERY — REPAIR, HERNIA, INGUINAL, ROBOT-ASSISTED, LAPAROSCOPIC, USING MESH
Anesthesia: General | Site: Abdomen | Laterality: Left

## 2020-02-13 MED ORDER — INSULIN ASPART 100 UNIT/ML ~~LOC~~ SOLN: 0.0000 [IU] | Freq: Three times a day (TID) | SUBCUTANEOUS | Status: DC

## 2020-02-13 MED ORDER — OXYCODONE HCL 5 MG PO TABS: 5.0000 mg | ORAL_TABLET | ORAL | Status: DC | PRN

## 2020-02-13 MED ORDER — FAMOTIDINE 20 MG PO TABS: 20.0000 mg | ORAL_TABLET | Freq: Once | ORAL | Status: AC

## 2020-02-13 MED ORDER — EPHEDRINE 5 MG/ML INJ
INTRAVENOUS | Status: AC
  Filled 2020-02-13: qty 10

## 2020-02-13 MED ORDER — AMLODIPINE BESYLATE 10 MG PO TABS
10.0000 mg | ORAL_TABLET | Freq: Every day | ORAL | Status: DC
  Administered 2020-02-14: 10:00:00 10 mg via ORAL
  Filled 2020-02-13: qty 1

## 2020-02-13 MED ORDER — CHLORHEXIDINE GLUCONATE CLOTH 2 % EX PADS
6.0000 | MEDICATED_PAD | Freq: Once | CUTANEOUS | Status: AC
  Administered 2020-02-13: 11:00:00 6 via TOPICAL

## 2020-02-13 MED ORDER — EPHEDRINE SULFATE 50 MG/ML IJ SOLN
INTRAMUSCULAR | Status: DC | PRN
  Administered 2020-02-13 (×3): 5 mg via INTRAVENOUS
  Administered 2020-02-13: 10 mg via INTRAVENOUS
  Administered 2020-02-13 (×2): 5 mg via INTRAVENOUS

## 2020-02-13 MED ORDER — ROCURONIUM BROMIDE 100 MG/10ML IV SOLN
INTRAVENOUS | Status: DC | PRN
  Administered 2020-02-13: 20 mg via INTRAVENOUS
  Administered 2020-02-13: 50 mg via INTRAVENOUS
  Administered 2020-02-13: 20 mg via INTRAVENOUS
  Administered 2020-02-13: 50 mg via INTRAVENOUS
  Administered 2020-02-13: 10 mg via INTRAVENOUS

## 2020-02-13 MED ORDER — GLYCOPYRROLATE 0.2 MG/ML IJ SOLN
INTRAMUSCULAR | Status: DC | PRN
  Administered 2020-02-13: .2 mg via INTRAVENOUS

## 2020-02-13 MED ORDER — FENTANYL CITRATE (PF) 100 MCG/2ML IJ SOLN
INTRAMUSCULAR | Status: DC | PRN
  Administered 2020-02-13 (×2): 25 ug via INTRAVENOUS
  Administered 2020-02-13: 50 ug via INTRAVENOUS
  Administered 2020-02-13: 100 ug via INTRAVENOUS
  Administered 2020-02-13: 25 ug via INTRAVENOUS

## 2020-02-13 MED ORDER — FENTANYL CITRATE (PF) 100 MCG/2ML IJ SOLN: 25.0000 ug | INTRAMUSCULAR | Status: DC | PRN

## 2020-02-13 MED ORDER — ONDANSETRON 4 MG PO TBDP: 4.0000 mg | ORAL_TABLET | Freq: Four times a day (QID) | ORAL | Status: DC | PRN

## 2020-02-13 MED ORDER — INSULIN ASPART 100 UNIT/ML ~~LOC~~ SOLN: 0.0000 [IU] | Freq: Every day | SUBCUTANEOUS | Status: DC

## 2020-02-13 MED ORDER — BUPIVACAINE-EPINEPHRINE 0.25% -1:200000 IJ SOLN
INTRAMUSCULAR | Status: DC | PRN
  Administered 2020-02-13: 30 mL

## 2020-02-13 MED ORDER — BUPIVACAINE-EPINEPHRINE (PF) 0.25% -1:200000 IJ SOLN
INTRAMUSCULAR | Status: AC
  Filled 2020-02-13: qty 10

## 2020-02-13 MED ORDER — BUPIVACAINE LIPOSOME 1.3 % IJ SUSP
INTRAMUSCULAR | Status: DC | PRN
  Administered 2020-02-13: 20 mL

## 2020-02-13 MED ORDER — FENTANYL CITRATE (PF) 100 MCG/2ML IJ SOLN
INTRAMUSCULAR | Status: AC
  Filled 2020-02-13: qty 2

## 2020-02-13 MED ORDER — ONDANSETRON HCL 4 MG/2ML IJ SOLN
INTRAMUSCULAR | Status: AC
  Filled 2020-02-13: qty 2

## 2020-02-13 MED ORDER — BUPIVACAINE LIPOSOME 1.3 % IJ SUSP: 20.0000 mL | Freq: Once | INTRAMUSCULAR | Status: DC

## 2020-02-13 MED ORDER — ONDANSETRON HCL 4 MG/2ML IJ SOLN
INTRAMUSCULAR | Status: DC | PRN
  Administered 2020-02-13: 4 mg via INTRAVENOUS

## 2020-02-13 MED ORDER — DEXAMETHASONE SODIUM PHOSPHATE 10 MG/ML IJ SOLN
INTRAMUSCULAR | Status: AC
  Filled 2020-02-13: qty 1

## 2020-02-13 MED ORDER — GABAPENTIN 300 MG PO CAPS: 300.0000 mg | ORAL_CAPSULE | ORAL | Status: AC

## 2020-02-13 MED ORDER — ROCURONIUM BROMIDE 10 MG/ML (PF) SYRINGE
PREFILLED_SYRINGE | INTRAVENOUS | Status: AC
  Filled 2020-02-13: qty 10

## 2020-02-13 MED ORDER — AMOXICILLIN 500 MG PO CAPS
1000.0000 mg | ORAL_CAPSULE | ORAL | Status: AC
  Administered 2020-02-13: 11:00:00 1000 mg via ORAL
  Filled 2020-02-13: qty 2

## 2020-02-13 MED ORDER — ONDANSETRON HCL 4 MG/2ML IJ SOLN: 4.0000 mg | Freq: Once | INTRAMUSCULAR | Status: DC | PRN

## 2020-02-13 MED ORDER — ACETAMINOPHEN 500 MG PO TABS
ORAL_TABLET | ORAL | Status: AC
  Administered 2020-02-13: 11:00:00 1000 mg via ORAL
  Filled 2020-02-13: qty 2

## 2020-02-13 MED ORDER — POLYETHYLENE GLYCOL 3350 17 G PO PACK: 17.0000 g | PACK | Freq: Every day | ORAL | Status: DC | PRN

## 2020-02-13 MED ORDER — ONDANSETRON HCL 4 MG/2ML IJ SOLN: 4.0000 mg | Freq: Four times a day (QID) | INTRAMUSCULAR | Status: DC | PRN

## 2020-02-13 MED ORDER — KETOROLAC TROMETHAMINE 15 MG/ML IJ SOLN
15.0000 mg | Freq: Four times a day (QID) | INTRAMUSCULAR | Status: DC
  Administered 2020-02-14 (×2): 15 mg via INTRAVENOUS
  Filled 2020-02-13 (×2): qty 1

## 2020-02-13 MED ORDER — LEVOTHYROXINE SODIUM 100 MCG PO TABS
100.0000 ug | ORAL_TABLET | Freq: Every day | ORAL | Status: DC
  Administered 2020-02-14: 05:00:00 100 ug via ORAL
  Filled 2020-02-13: qty 1

## 2020-02-13 MED ORDER — DEXAMETHASONE SODIUM PHOSPHATE 10 MG/ML IJ SOLN
INTRAMUSCULAR | Status: DC | PRN
  Administered 2020-02-13: 5 mg via INTRAVENOUS

## 2020-02-13 MED ORDER — ACETAMINOPHEN 10 MG/ML IV SOLN
INTRAVENOUS | Status: AC
  Filled 2020-02-13: qty 100

## 2020-02-13 MED ORDER — GLYCOPYRROLATE 0.2 MG/ML IJ SOLN
INTRAMUSCULAR | Status: AC
  Filled 2020-02-13: qty 1

## 2020-02-13 MED ORDER — ACETAMINOPHEN 500 MG PO TABS: 1000.0000 mg | ORAL_TABLET | ORAL | Status: AC

## 2020-02-13 MED ORDER — SODIUM CHLORIDE 0.9 % IV SOLN
2.0000 g | Freq: Three times a day (TID) | INTRAVENOUS | Status: DC
  Administered 2020-02-14: 02:00:00 2 g via INTRAVENOUS
  Filled 2020-02-13 (×3): qty 2

## 2020-02-13 MED ORDER — SUGAMMADEX SODIUM 200 MG/2ML IV SOLN
INTRAVENOUS | Status: DC | PRN
  Administered 2020-02-13: 200 mg via INTRAVENOUS

## 2020-02-13 MED ORDER — ATORVASTATIN CALCIUM 20 MG PO TABS
20.0000 mg | ORAL_TABLET | Freq: Every day | ORAL | Status: DC
  Administered 2020-02-14: 10:00:00 20 mg via ORAL
  Filled 2020-02-13: qty 1

## 2020-02-13 MED ORDER — ACETAMINOPHEN 500 MG PO TABS
1000.0000 mg | ORAL_TABLET | Freq: Four times a day (QID) | ORAL | Status: DC
  Administered 2020-02-13 – 2020-02-14 (×2): 1000 mg via ORAL
  Filled 2020-02-13 (×2): qty 2

## 2020-02-13 MED ORDER — CEFAZOLIN SODIUM 1 G IJ SOLR
INTRAMUSCULAR | Status: AC
  Filled 2020-02-13: qty 20

## 2020-02-13 MED ORDER — HYDROMORPHONE HCL 1 MG/ML IJ SOLN: 0.5000 mg | INTRAMUSCULAR | Status: DC | PRN

## 2020-02-13 MED ORDER — CEFAZOLIN SODIUM-DEXTROSE 2-4 GM/100ML-% IV SOLN
INTRAVENOUS | Status: AC
  Filled 2020-02-13: qty 100

## 2020-02-13 MED ORDER — ENOXAPARIN SODIUM 40 MG/0.4ML ~~LOC~~ SOLN: 40.0000 mg | SUBCUTANEOUS | Status: DC

## 2020-02-13 MED ORDER — LACTATED RINGERS IV SOLN: INTRAVENOUS | Status: DC

## 2020-02-13 MED ORDER — PANTOPRAZOLE SODIUM 40 MG IV SOLR
40.0000 mg | Freq: Every day | INTRAVENOUS | Status: DC
  Administered 2020-02-13: 40 mg via INTRAVENOUS
  Filled 2020-02-13: qty 40

## 2020-02-13 MED ORDER — BUPIVACAINE LIPOSOME 1.3 % IJ SUSP
INTRAMUSCULAR | Status: AC
  Filled 2020-02-13: qty 20

## 2020-02-13 MED ORDER — PROPOFOL 10 MG/ML IV BOLUS
INTRAVENOUS | Status: DC | PRN
  Administered 2020-02-13: 150 mg via INTRAVENOUS

## 2020-02-13 MED ORDER — FAMOTIDINE 20 MG PO TABS
ORAL_TABLET | ORAL | Status: AC
  Administered 2020-02-13: 11:00:00 20 mg via ORAL
  Filled 2020-02-13: qty 1

## 2020-02-13 MED ORDER — GABAPENTIN 300 MG PO CAPS
ORAL_CAPSULE | ORAL | Status: AC
  Administered 2020-02-13: 300 mg via ORAL
  Filled 2020-02-13: qty 1

## 2020-02-13 MED ORDER — PROPOFOL 10 MG/ML IV BOLUS
INTRAVENOUS | Status: AC
  Filled 2020-02-13: qty 20

## 2020-02-13 MED ORDER — KETOROLAC TROMETHAMINE 15 MG/ML IJ SOLN
INTRAMUSCULAR | Status: AC
  Administered 2020-02-13: 21:00:00 15 mg via INTRAVENOUS
  Filled 2020-02-13: qty 1

## 2020-02-13 MED ORDER — ACETAMINOPHEN 10 MG/ML IV SOLN
INTRAVENOUS | Status: DC | PRN
  Administered 2020-02-13: 1000 mg via INTRAVENOUS

## 2020-02-13 MED ORDER — SODIUM CHLORIDE FLUSH 0.9 % IV SOLN
INTRAVENOUS | Status: AC
  Filled 2020-02-13: qty 10

## 2020-02-13 MED ORDER — LIDOCAINE HCL (CARDIAC) PF 100 MG/5ML IV SOSY
PREFILLED_SYRINGE | INTRAVENOUS | Status: DC | PRN
  Administered 2020-02-13: 100 mg via INTRAVENOUS

## 2020-02-13 MED ORDER — SODIUM CHLORIDE 0.9 % IV SOLN: INTRAVENOUS | Status: DC

## 2020-02-13 SURGICAL SUPPLY — 52 items
CANISTER SUCT 1200ML W/VALVE (MISCELLANEOUS) ×3 IMPLANT
CHLORAPREP W/TINT 26 (MISCELLANEOUS) ×3 IMPLANT
COVER TIP SHEARS 8 DVNC (MISCELLANEOUS) ×1 IMPLANT
COVER TIP SHEARS 8MM DA VINCI (MISCELLANEOUS) ×2
COVER WAND RF STERILE (DRAPES) ×3 IMPLANT
DEFOGGER SCOPE WARMER CLEARIFY (MISCELLANEOUS) ×3 IMPLANT
DERMABOND ADVANCED (GAUZE/BANDAGES/DRESSINGS) ×2
DERMABOND ADVANCED .7 DNX12 (GAUZE/BANDAGES/DRESSINGS) ×1 IMPLANT
DRAPE 3/4 80X56 (DRAPES) ×3 IMPLANT
DRAPE ARM DVNC X/XI (DISPOSABLE) ×3 IMPLANT
DRAPE COLUMN DVNC XI (DISPOSABLE) ×1 IMPLANT
DRAPE DA VINCI XI ARM (DISPOSABLE) ×6
DRAPE DA VINCI XI COLUMN (DISPOSABLE) ×2
ELECT CAUTERY BLADE 6.4 (BLADE) ×3 IMPLANT
ELECT REM PT RETURN 9FT ADLT (ELECTROSURGICAL) ×3
ELECTRODE REM PT RTRN 9FT ADLT (ELECTROSURGICAL) ×1 IMPLANT
GLOVE SURG SYN 7.0 (GLOVE) ×6 IMPLANT
GLOVE SURG SYN 7.5  E (GLOVE) ×4
GLOVE SURG SYN 7.5 E (GLOVE) ×2 IMPLANT
GOWN STRL REUS W/ TWL LRG LVL3 (GOWN DISPOSABLE) ×4 IMPLANT
GOWN STRL REUS W/TWL LRG LVL3 (GOWN DISPOSABLE) ×8
IRRIGATION STRYKERFLOW (MISCELLANEOUS) IMPLANT
IRRIGATOR STRYKERFLOW (MISCELLANEOUS)
IRRIGATOR SUCT 8 DISP DVNC XI (IRRIGATION / IRRIGATOR) ×1 IMPLANT
IRRIGATOR SUCTION 8MM XI DISP (IRRIGATION / IRRIGATOR) ×2
IV NS 1000ML (IV SOLUTION)
IV NS 1000ML BAXH (IV SOLUTION) IMPLANT
KIT PINK PAD W/HEAD ARE REST (MISCELLANEOUS) ×3
KIT PINK PAD W/HEAD ARM REST (MISCELLANEOUS) ×1 IMPLANT
LABEL OR SOLS (LABEL) ×3 IMPLANT
MESH 3DMAX 4X6 LT LRG (Mesh General) ×3 IMPLANT
NEEDLE HYPO 22GX1.5 SAFETY (NEEDLE) ×3 IMPLANT
NEEDLE INSUFFLATION 14GA 120MM (NEEDLE) ×3 IMPLANT
OBTURATOR OPTICAL STANDARD 8MM (TROCAR) ×2
OBTURATOR OPTICAL STND 8 DVNC (TROCAR) ×1
OBTURATOR OPTICALSTD 8 DVNC (TROCAR) ×1 IMPLANT
PACK LAP CHOLECYSTECTOMY (MISCELLANEOUS) ×3 IMPLANT
PENCIL ELECTRO HAND CTR (MISCELLANEOUS) ×3 IMPLANT
SEAL CANN UNIV 5-8 DVNC XI (MISCELLANEOUS) ×3 IMPLANT
SEAL XI 5MM-8MM UNIVERSAL (MISCELLANEOUS) ×9
SOLUTION ELECTROLUBE (MISCELLANEOUS) ×3 IMPLANT
SPONGE LAP 18X18 RF (DISPOSABLE) ×3 IMPLANT
SUT MNCRL AB 4-0 PS2 18 (SUTURE) ×6 IMPLANT
SUT SILK 3 0 SH 30 (SUTURE) ×6 IMPLANT
SUT VIC AB 2-0 SH 27 (SUTURE) ×4
SUT VIC AB 2-0 SH 27XBRD (SUTURE) ×2 IMPLANT
SUT VIC AB 3-0 SH 27 (SUTURE) ×2
SUT VIC AB 3-0 SH 27X BRD (SUTURE) ×1 IMPLANT
SUT VICRYL 0 AB UR-6 (SUTURE) ×6 IMPLANT
SUT VLOC 90 S/L VL9 GS22 (SUTURE) ×3 IMPLANT
TROCAR BALLN GELPORT 12X130M (ENDOMECHANICALS) ×3 IMPLANT
TUBING EVAC SMOKE HEATED PNEUM (TUBING) ×3 IMPLANT

## 2020-02-13 NOTE — OR Nursing (Signed)
EKG completed and given to Dr. Kayleen Memos at this time.

## 2020-02-13 NOTE — Interval H&P Note (Signed)
History and Physical Interval Note:  02/13/2020 2:37 PM  Jeffrey Guerrero  has presented today for surgery, with the diagnosis of incarcerated left inguinal hernia.  The various methods of treatment have been discussed with the patient and family. After consideration of risks, benefits and other options for treatment, the patient has consented to  Procedure(s): XI ROBOTIC [place] (Left) as a surgical intervention.  The patient's history has been reviewed, patient examined, no change in status, stable for surgery.  I have reviewed the patient's chart and labs.  Questions were answered to the patient's satisfaction.     [place] Gavitt

## 2020-02-13 NOTE — Anesthesia Preprocedure Evaluation (Signed)
Anesthesia Evaluation  Patient identified by MRN, date of birth, ID band Patient awake    Reviewed: Allergy & Precautions, H&P , NPO status , Patient's Chart, lab work & pertinent test results  History of Anesthesia Complications Negative for: history of anesthetic complications  Airway Mallampati: III  TM Distance: >3 FB Neck ROM: limited    Dental  (+) Poor Dentition, Missing   Pulmonary neg pulmonary ROS, neg shortness of breath,           Cardiovascular Exercise Tolerance: Good hypertension, (-) angina(-) Past MI      Neuro/Psych  Neuromuscular disease negative psych ROS   GI/Hepatic negative GI ROS, Neg liver ROS, neg GERD  ,  Endo/Other  diabetes, Type 2Hypothyroidism   Renal/GU Renal disease  negative genitourinary   Musculoskeletal  (+) Arthritis ,   Abdominal   Peds negative pediatric ROS (+)  Hematology negative hematology ROS (+)   Anesthesia Other Findings Past Medical History: No date: Arthritis     Comment:  "knees" (02/13/2014) 02/16/2014: Bacterial endocarditis - pulmonic valve vegetation with  Streptococcus pneumoniae sepsis     Comment:  Pulmonic valve vegetations on TEE with blood cultures               positive for Streptococcus pneumoniae, complicated by               pulmonary emboli  No date: Diabetes mellitus without complication ([facility])     Comment:  takes metformin No date: Hypertension No date: Hypothyroidism 02/16/2014: Pulmonic valve insufficiency     Comment:  severe 11/01/2014: S/P pulmonic valve replacement with homograft No date: Thyroid disease  Past Surgical History: 11/01/2014: AORTIC VALVE REPLACEMENT; [name]     Comment:  Procedure: PULMONIC HOMOGRAFT VALVE REPLACEMENT ;                Surgeon: Rexene Alberts, MD;  Location: [place];  Service:              Open Heart Surgery;  Laterality: [name]; No date: APPENDECTOMY No date: COLONOSCOPY 10/09/2014: LEFT HEART CATHETERIZATION  WITH CORONARY ANGIOGRAM; [name]     Comment:  Procedure: LEFT HEART CATHETERIZATION WITH CORONARY               ANGIOGRAM;  Surgeon: Jettie Booze, MD;  Location:               [facility] CATH LAB;  Service: Cardiovascular;  Laterality: [name]; 02/16/2014: TEE WITHOUT CARDIOVERSION; [name]     Comment:  Procedure: TRANSESOPHAGEAL ECHOCARDIOGRAM (TEE);                Surgeon: [name], MD;  Location: [facility] ENDOSCOPY;                Service: Cardiovascular;  Laterality: [name]; 04/12/2014: TEE WITHOUT CARDIOVERSION; [name]     Comment:  Procedure: TRANSESOPHAGEAL ECHOCARDIOGRAM (TEE);                Surgeon: [name], MD;  Location: [facility] ENDOSCOPY;                Service: Cardiovascular;  Laterality: [name]; 11/01/2014: TEE WITHOUT CARDIOVERSION; [name]     Comment:  Procedure: TRANSESOPHAGEAL ECHOCARDIOGRAM (TEE);                Surgeon: Rexene Alberts, MD;  Location: [place];  Service:              Open Heart Surgery;  Laterality: [name]; No  date: TONSILLECTOMY     Reproductive/Obstetrics negative OB ROS                             Anesthesia Physical  Anesthesia Plan  ASA: III  Anesthesia Plan: General   Post-op Pain Management:    Induction: Intravenous  PONV Risk Score and Plan:   Airway Management Planned: Oral ETT  Additional Equipment:   Intra-op Plan:   Post-operative Plan: Extubation in OR  Informed Consent: I have reviewed the patients History and Physical, chart, labs and discussed the procedure including the risks, benefits and alternatives for the proposed anesthesia with the patient or authorized representative who has indicated his/her understanding and acceptance.     Dental Advisory Given  Plan Discussed with: Anesthesiologist, CRNA and Surgeon  Anesthesia Plan Comments: (Patient consented for risks of anesthesia including but not limited to:  - adverse reactions to medications - risk of intubation if required - damage to teeth, lips or other oral  mucosa - sore throat or hoarseness - Damage to heart, brain, lungs or loss of life  Patient voiced understanding.)        Anesthesia Quick Evaluation

## 2020-02-13 NOTE — Transfer of Care (Signed)
Immediate Anesthesia Transfer of Care Note  Patient: Jeffrey Guerrero  Procedure(s) Performed: XI ROBOTIC ASSISTED INGUINAL HERNIA REPAIR WITH MESH (Left Abdomen)  Patient Location: PACU  Anesthesia Type:General  Level of Consciousness: sedated  Airway & Oxygen Therapy: Patient Spontanous Breathing and Patient connected to face mask oxygen  Post-op Assessment: Report given to RN and Post -op Vital signs reviewed and stable  Post vital signs: Reviewed and stable  Last Vitals:  Vitals Value Taken Time  BP 125/73 02/13/20 2014  Temp    Pulse 85 02/13/20 2018  Resp 13 02/13/20 2018  SpO2 100 % 02/13/20 2018  Vitals shown include unvalidated device data.  Last Pain:  Vitals:   02/13/20 1029  TempSrc: Temporal  PainSc: 0-No pain         Complications: No apparent anesthesia complications

## 2020-02-13 NOTE — Brief Op Note (Signed)
02/13/2020  10:13 PM  PATIENT:  [name]  69 y.o. male  PRE-OPERATIVE DIAGNOSIS:  incarcerated left inguinal hernia  POST-OPERATIVE DIAGNOSIS:  incarcerated left inguinal hernia  PROCEDURE:  Procedure(s): XI ROBOTIC ASSISTED INGUINAL HERNIA REPAIR WITH MESH (Left)  SURGEON:  Surgeon(s) and Role:    * [name], MD - Primary  ASSISTANTS: Luella Cook, PA-S   ANESTHESIA:   general  EBL:  20 ml   BLOOD ADMINISTERED:none  DRAINS: none   LOCAL MEDICATIONS USED:  BUPIVICAINE   SPECIMEN:  No Specimen  DISPOSITION OF SPECIMEN:  [name]  COUNTS:  YES  DICTATION: .Dragon Dictation  PLAN OF CARE: Admit for overnight observation  PATIENT DISPOSITION:  PACU - hemodynamically stable.   Delay start of Pharmacological VTE agent (>24hrs) due to surgical blood loss or risk of bleeding: yes

## 2020-02-13 NOTE — Op Note (Signed)
Procedure Date:  02/13/2020  Pre-operative Diagnosis:  Incarcerated Left inguinal hernia  Post-operative Diagnosis:  Incarcerated left inguinal hernia  Procedure:   1. Robotic assisted laparoscopic incarcerated left inguinal Hernia Repair 2. Creation of Posterior Rectus-Transversalis Fascia Advancment Flap for Coverage of Pelvic Wound (200 cm)  Surgeon:  Melvyn Neth, MD  Assistant:  Luella Cook, PA-S  Anesthesia:  General endotracheal  Estimated Blood Loss:  20 ml  Specimens:  None  Complications:  None  Findings:  Patient had a large scrotal left incarcerated inguinal hernia.  Intraoperatively, he was found to have a segment of sigmoid colon incarcerated.  It was viable.  Three serosal tears were created in reducing the contents of the hernia, which were repaired with 3-0 Silk sutures.    Indications for Procedure:  This is a 69 y.o. male who presents with an incarcerated left inguinal hernia.  The options of surgery versus observation were reviewed with the patient and/or family. The risks of bleeding, abscess or infection, recurrence of symptoms, potential for an open procedure, injury to surrounding structures, and chronic pain were all discussed with the patient and was willing to proceed.  We have planned this transabdominal procedure with the creation of a peritoneal flap based on the posterior rectus sheath and transversalis fascia in order to fully cover the mesh, creating a natural tisssue barrier for the bowel and peritoneal cavity.  Description of Procedure: The patient was correctly identified in the preoperative area and brought into the operating room.  The patient was placed supine with VTE prophylaxis in place.  Appropriate time-outs were performed.  Anesthesia was induced and the patient was intubated.  Foley catheter was placed.  Appropriate antibiotics were infused.  The abdomen was prepped and draped in a sterile fashion. A supraumbilical incision was made. A  cutdown technique was used to enter the abdominal cavity without injury, and a Hasson trocar was inserted.  Pneumoperitoneum was obtained with appropriate opening pressures.  Two 8-mm ports were placed in the right and left lateral positions under direct visualization.  The [employer] platform was docked onto the patient, the camera was inserted and targeted, and the instruments were placed under direct visualization.  Both inguinal regions were inspected for hernias and it was confirmed that the patient had a left inguinal hernia, and possibly the beginning of a right inguinal hernia. We then proceeded to reduce the patient's left inguinal hernia.  This was a large hernia and a long segment of sigmoid colon including epiploic fat and mesentery was incarcerated. It was very difficult to reduce these, and in doing so, three small serosal tears were created.  Mild bleeding from the epiploic fat was encountered, and controlled with force bipolar instrument.  Once reduced, using electocautery, the peritoneal and posterior rectus tissue flap was created.  The peritoneum on the left side was scored from the median umbilical ligament laterally towards the ASIS.  The flap was mobilized using robotic scissors and the bipolar instruments, creating a plane along the posterior rectus sheath and transversalis fascia down to the pubic tubercle medially. It was then further mobilized laterally across the inguinal canal and femoral vessels and onto the psoas muscle. The inferior epigastric vessels were identified and preserved. This created a posterior rectus and peritoneal flap measuring roughly 17 cm x 12 cm.  The hernia sac and contents were reduced preserving all structures.  A left large Bard 3D Max mesh was placed via the umbilical port as well as a 2-0 Vicryl  suture and 2-0 V-Loc suture.  The mesh was placed with good overlap along all the potential hernia defects and secured in place with 2-0 Vicryl along the medial  superomedial and superolateral aspects.  The peritoneal flap was then advanced over the mesh and carried over to close the defect. A running 2 O V lock suture was used to approximate the edge of the flap onto the peritoneum. Then a 3-0 Silk suture was used to repair the serosal tears in the sigmoid colon.  There was no true enterotomy and the bowel was carefully inspected.  At that point, the right groin was again evaluated, and given the very long duration of this case, and the minimal if any inguinal hernia found there, it was decided to conclude the case.  The [employer] was undocked.  The 8 mm ports were removed under direct visualization and the Hasson trocar was removed.  The fascial opening was closed using 0 vicryl suture.  Local anesthetic was infused in all incisions as well as a left ilioinguinal block, and the incisions were closed with 4-0 Monocryl.  The wounds were cleaned and sealed with DermaBond.  Foley catheter was removed and the patient was emerged from anesthesia and extubated and brought to the recovery room for further management.  The patient tolerated the procedure well and all counts were correct at the end of the case.   Melvyn Neth, MD

## 2020-02-13 NOTE — Anesthesia Procedure Notes (Signed)
Procedure Name: Intubation Date/Time: 02/13/2020 3:07 PM Performed by: Jonna Clark, CRNA Pre-anesthesia Checklist: Patient identified, Patient being monitored, Timeout performed, Emergency Drugs available and Suction available Patient Re-evaluated:Patient Re-evaluated prior to induction Oxygen Delivery Method: Circle system utilized Preoxygenation: Pre-oxygenation with 100% oxygen Induction Type: IV induction Ventilation: Mask ventilation without difficulty Laryngoscope Size: McGraph and 4 Grade View: Grade I Tube type: Oral Tube size: 7.5 mm Number of attempts: 1 Airway Equipment and Method: Stylet Placement Confirmation: ETT inserted through vocal cords under direct vision,  positive ETCO2 and breath sounds checked- equal and bilateral Secured at: 23 cm Tube secured with: Tape Dental Injury: Teeth and Oropharynx as per pre-operative assessment

## 2020-02-13 NOTE — Addendum Note (Signed)
Addendum  created 02/13/20 2134 by Lendon Colonel, CRNA   Intraprocedure Event edited, Intraprocedure Staff edited

## 2020-02-13 NOTE — Anesthesia Postprocedure Evaluation (Signed)
Anesthesia Post Note  Patient: Elek Rinaldo  Procedure(s) Performed: XI ROBOTIC ASSISTED INGUINAL HERNIA REPAIR WITH MESH (Left Abdomen)  Patient location during evaluation: PACU Anesthesia Type: General Level of consciousness: awake and alert Pain management: pain level controlled Vital Signs Assessment: post-procedure vital signs reviewed and stable Respiratory status: spontaneous breathing, nonlabored ventilation, respiratory function stable and patient connected to nasal cannula oxygen Cardiovascular status: blood pressure returned to baseline and stable Postop Assessment: no apparent nausea or vomiting Anesthetic complications: no     Last Vitals:  Vitals:   02/13/20 2044 02/13/20 2059  BP: 119/64 117/65  Pulse: 84 82  Resp: 17 17  Temp:    SpO2: 94% 99%    Last Pain:  Vitals:   02/13/20 2059  TempSrc:   PainSc: 0-No pain                 Arita Miss

## 2020-02-14 DIAGNOSIS — Z791 Long term (current) use of non-steroidal anti-inflammatories (NSAID): Secondary | ICD-10-CM | POA: Diagnosis not present

## 2020-02-14 DIAGNOSIS — M170 Bilateral primary osteoarthritis of knee: Secondary | ICD-10-CM | POA: Diagnosis not present

## 2020-02-14 DIAGNOSIS — I10 Essential (primary) hypertension: Secondary | ICD-10-CM | POA: Diagnosis not present

## 2020-02-14 DIAGNOSIS — Z86711 Personal history of pulmonary embolism: Secondary | ICD-10-CM | POA: Diagnosis not present

## 2020-02-14 DIAGNOSIS — E119 Type 2 diabetes mellitus without complications: Secondary | ICD-10-CM | POA: Diagnosis not present

## 2020-02-14 DIAGNOSIS — K4030 Unilateral inguinal hernia, with obstruction, without gangrene, not specified as recurrent: Secondary | ICD-10-CM | POA: Diagnosis not present

## 2020-02-14 DIAGNOSIS — G709 Myoneural disorder, unspecified: Secondary | ICD-10-CM | POA: Diagnosis not present

## 2020-02-14 DIAGNOSIS — Z79899 Other long term (current) drug therapy: Secondary | ICD-10-CM | POA: Diagnosis not present

## 2020-02-14 DIAGNOSIS — Z7982 Long term (current) use of aspirin: Secondary | ICD-10-CM | POA: Diagnosis not present

## 2020-02-14 DIAGNOSIS — Z7984 Long term (current) use of oral hypoglycemic drugs: Secondary | ICD-10-CM | POA: Diagnosis not present

## 2020-02-14 DIAGNOSIS — Z952 Presence of prosthetic heart valve: Secondary | ICD-10-CM | POA: Diagnosis not present

## 2020-02-14 DIAGNOSIS — E039 Hypothyroidism, unspecified: Secondary | ICD-10-CM | POA: Diagnosis not present

## 2020-02-14 LAB — CBC WITH DIFFERENTIAL/PLATELET
Abs Immature Granulocytes: 0.03 10*3/uL (ref 0.00–0.07)
Basophils Absolute: 0 10*3/uL (ref 0.0–0.1)
Basophils Relative: 0 %
Eosinophils Absolute: 0 10*3/uL (ref 0.0–0.5)
Eosinophils Relative: 0 %
HCT: 38.9 % — ABNORMAL LOW (ref 39.0–52.0)
Hemoglobin: 13 g/dL (ref 13.0–17.0)
Immature Granulocytes: 0 %
Lymphocytes Relative: 9 %
Lymphs Abs: 0.8 10*3/uL (ref 0.7–4.0)
MCH: 30.4 pg (ref 26.0–34.0)
MCHC: 33.4 g/dL (ref 30.0–36.0)
MCV: 91.1 fL (ref 80.0–100.0)
Monocytes Absolute: 0.8 10*3/uL (ref 0.1–1.0)
Monocytes Relative: 10 %
Neutro Abs: 6.9 10*3/uL (ref 1.7–7.7)
Neutrophils Relative %: 81 %
Platelets: 181 10*3/uL (ref 150–400)
RBC: 4.27 MIL/uL (ref 4.22–5.81)
RDW: 14.1 % (ref 11.5–15.5)
WBC: 8.5 10*3/uL (ref 4.0–10.5)
nRBC: 0 % (ref 0.0–0.2)

## 2020-02-14 LAB — BASIC METABOLIC PANEL
Anion gap: 7 (ref 5–15)
BUN: 16 mg/dL (ref 8–23)
CO2: 25 mmol/L (ref 22–32)
Calcium: 8.4 mg/dL — ABNORMAL LOW (ref 8.9–10.3)
Chloride: 108 mmol/L (ref 98–111)
Creatinine, Ser: 1.16 mg/dL (ref 0.61–1.24)
GFR calc Af Amer: >60 mL/min (ref >60)
GFR calc non Af Amer: >60 mL/min (ref >60)
Glucose, Bld: 130 mg/dL — ABNORMAL HIGH (ref 70–99)
Potassium: 4 mmol/L (ref 3.5–5.1)
Sodium: 140 mmol/L (ref 135–145)

## 2020-02-14 LAB — GLUCOSE, CAPILLARY: Glucose-Capillary: 113 mg/dL — ABNORMAL HIGH (ref 70–99)

## 2020-02-14 LAB — HIV ANTIBODY (ROUTINE TESTING W REFLEX): HIV Screen 4th Generation wRfx: NONREACTIVE

## 2020-02-14 LAB — MAGNESIUM: Magnesium: 2.1 mg/dL (ref 1.7–2.4)

## 2020-02-14 MED ORDER — HYDROCODONE-ACETAMINOPHEN 5-325 MG PO TABS
1.0000 | ORAL_TABLET | Freq: Four times a day (QID) | ORAL | 0 refills | Status: DC | PRN
Start: 2020-02-14 — End: 2020-03-27

## 2020-02-14 MED ORDER — IBUPROFEN 800 MG PO TABS
800.0000 mg | ORAL_TABLET | Freq: Three times a day (TID) | ORAL | 0 refills | Status: DC | PRN
Start: 2020-02-14 — End: 2020-03-27

## 2020-02-14 NOTE — Progress Notes (Signed)
[name] A and O x4. VSS. Pt tolerating diet well. No complaints of nausea or vomiting. IV removed intact, prescriptions given. Pt voices understanding of discharge instructions with no further questions. Patient discharged via wheelchair with [facility].  Allergies as of 02/14/2020   No Known Allergies     Medication List    TAKE these medications   amLODipine 10 MG tablet Commonly known as: NORVASC Take 1 tablet (10 mg total) by mouth daily.   aspirin 81 MG tablet Take 1 tablet (81 mg total) by mouth daily.   atorvastatin 20 MG tablet Commonly known as: LIPITOR Take 1 tablet (20 mg total) by mouth daily.   cholecalciferol 25 MCG (1000 UNIT) tablet Commonly known as: VITAMIN D3 Take 1,000 Units by mouth daily.   cyanocobalamin 2000 MCG tablet Take 2,000 mcg by mouth at bedtime.   folic acid Q000111Q MCG tablet Commonly known as: FOLVITE Take 400 mcg by mouth at bedtime.   HYDROcodone-acetaminophen 5-325 MG tablet Commonly known as: NORCO/VICODIN Take 1 tablet by mouth every 6 (six) hours as needed for moderate pain.   ibuprofen 800 MG tablet Commonly known as: ADVIL Take 1 tablet (800 mg total) by mouth every 8 (eight) hours as needed.   levothyroxine 100 MCG tablet Commonly known as: SYNTHROID TAKE 1 TABLET (100 MCG TOTAL) BY MOUTH DAILY BEFORE BREAKFAST.   meloxicam 7.5 MG tablet Commonly known as: MOBIC Take 1 tablet (7.5mg ) by mouth daily as needed with food. No other NSAIDs.   metFORMIN 500 MG tablet Commonly known as: GLUCOPHAGE TAKE 1 TABLET BY MOUTH TWICE A DAY WITH MEALS   pyridOXINE 100 MG tablet Commonly known as: VITAMIN B-6 Take 100 mg by mouth at bedtime.       Vitals:   02/14/20 0805 02/14/20 0959  BP: 128/71 132/76  Pulse: 72   Resp: 18   Temp: 98.4 F (36.9 C)   SpO2: 97%     Shelli Portilla C Quanetta Truss

## 2020-02-14 NOTE — Discharge Summary (Signed)
[facility] SURGICAL DISCHARGE SUMMARY  Patient ID: Jeffrey Guerrero MRN: [id number] DOB/AGE: 03-08-1951 69 y.o.  Admit date: 02/13/2020 Discharge date: 02/14/2020  Discharge Diagnoses Patient Active Problem List   Diagnosis Date Noted  . Incarcerated left inguinal hernia 02/13/2020    Consultants None  Procedures 02/13/2020:  1.Robotic assisted laparoscopicincarcerated left inguinal Hernia Repair 2.Creation of Posterior Rectus-Transversalis Fascia Advancment Flap for Coverage of Pelvic Wound (200 cm)  HPI:  This is a 69 y.o. male who presents with an incarcerated left inguinal hernia who presents to [facility] on 02/13/2020 for robotic assisted laparoscopic inguinal hernia repair with Dr Hampton Abbot.   Hospital Course: Informed consent was obtained and documented, and patient underwent uneventful robotic assisted laparoscopic left inguinal hernia repair (Dr Hampton Abbot, 02/13/2020).  Post-operatively, patient's pain and symptoms improved/resolved and advancement of patient's diet and ambulation were well-tolerated. The remainder of patient's hospital course was essentially unremarkable, and discharge planning was initiated accordingly with patient safely able to be discharged home with appropriate discharge instructions, pain control, and outpatient follow-up after all of his questions were answered to his expressed satisfaction.   Discharge Condition: Good   Physical Examination:  Constitutional: Well appearing male, NAD Pulmonary: Normal effort, no respiratory distress Gastrointestinal: Soft, non-tender, non-distended, no rebound/guarding, no testicular/scrotal swelling Skin: Laparoscopic incisions are CDI with dermabond, no erythema or drainage     Allergies as of 02/14/2020   No Known Allergies     Medication List    TAKE these medications   amLODipine 10 MG tablet Commonly known as: NORVASC Take 1 tablet (10 mg total) by mouth daily.   aspirin 81 MG tablet Take  1 tablet (81 mg total) by mouth daily.   atorvastatin 20 MG tablet Commonly known as: LIPITOR Take 1 tablet (20 mg total) by mouth daily.   cholecalciferol 25 MCG (1000 UNIT) tablet Commonly known as: VITAMIN D3 Take 1,000 Units by mouth daily.   cyanocobalamin 2000 MCG tablet Take 2,000 mcg by mouth at bedtime.   folic acid Q000111Q MCG tablet Commonly known as: FOLVITE Take 400 mcg by mouth at bedtime.   HYDROcodone-acetaminophen 5-325 MG tablet Commonly known as: NORCO/VICODIN Take 1 tablet by mouth every 6 (six) hours as needed for moderate pain.   ibuprofen 800 MG tablet Commonly known as: ADVIL Take 1 tablet (800 mg total) by mouth every 8 (eight) hours as needed.   levothyroxine 100 MCG tablet Commonly known as: SYNTHROID TAKE 1 TABLET (100 MCG TOTAL) BY MOUTH DAILY BEFORE BREAKFAST.   meloxicam 7.5 MG tablet Commonly known as: MOBIC Take 1 tablet (7.5mg ) by mouth daily as needed with food. No other NSAIDs.   metFORMIN 500 MG tablet Commonly known as: GLUCOPHAGE TAKE 1 TABLET BY MOUTH TWICE A DAY WITH MEALS   pyridOXINE 100 MG tablet Commonly known as: VITAMIN B-6 Take 100 mg by mouth at bedtime.        Follow-up Information    Piscoya, Jacqulyn Bath, MD. Schedule an appointment as soon as possible for a visit in 2 week(s).   Specialty: General Surgery Why: s/p robotic left inguinal hernia repair Contact information: [address] [place] [address] [phone]            Time spent on discharge management including discussion of hospital course, clinical condition, outpatient instructions, prescriptions, and follow up with the patient and members of the medical team: >30 minutes  -- Edison Simon , PA-C [facility]  02/14/2020, 9:31 AM [phone] M-F: 7am - 4pm

## 2020-02-21 ENCOUNTER — Encounter: Payer: Self-pay | Admitting: Surgery

## 2020-02-21 ENCOUNTER — Ambulatory Visit: Payer: Self-pay | Admitting: Physician Assistant

## 2020-02-21 ENCOUNTER — Other Ambulatory Visit: Payer: Self-pay

## 2020-02-21 ENCOUNTER — Ambulatory Visit (INDEPENDENT_AMBULATORY_CARE_PROVIDER_SITE_OTHER): Payer: Self-pay | Admitting: Surgery

## 2020-02-21 VITALS — BP 148/83 | HR 86 | Temp 97.7°F | Resp 14 | Ht 73.0 in | Wt 203.8 lb

## 2020-02-21 DIAGNOSIS — K4030 Unilateral inguinal hernia, with obstruction, without gangrene, not specified as recurrent: Secondary | ICD-10-CM

## 2020-02-21 DIAGNOSIS — Z09 Encounter for follow-up examination after completed treatment for conditions other than malignant neoplasm: Secondary | ICD-10-CM

## 2020-02-21 NOTE — Patient Instructions (Addendum)
Use Ice to the swelling area several times a day. Do be sure to have something between your skin and the ice. The swelling will get better with time.  When up and about be sure to wear supportive underwear.   Follow up here in 2 weeks for reevaluation.   Call us if the area starts to worsen.   GENERAL POST-OPERATIVE PATIENT INSTRUCTIONS   WOUND CARE INSTRUCTIONS:  Keep a dry clean dressing on the wound if there is drainage. The initial bandage may be removed after 24 hours.  Once the wound has quit draining you may leave it open to air.  If clothing rubs against the wound or causes irritation and the wound is not draining you may cover it with a dry dressing during the daytime.  Try to keep the wound dry and avoid ointments on the wound unless directed to do so.  If the wound becomes bright red and painful or starts to drain infected material that is not clear, please contact your physician immediately.  If the wound is mildly pink and has a thick firm ridge underneath it, this is normal, and is referred to as a healing ridge.  This will resolve over the next 4-6 weeks.  BATHING: You may shower if you have been informed of this by your surgeon. However, Please do not submerge in a tub, hot tub, or pool until incisions are completely sealed or have been told by your surgeon that you may do so.  DIET:  You may eat any foods that you can tolerate.  It is a good idea to eat a high fiber diet and take in plenty of fluids to prevent constipation.  If you do become constipated you may want to take a mild laxative or take ducolax tablets on a daily basis until your bowel habits are regular.  Constipation can be very uncomfortable, along with straining, after recent surgery.  ACTIVITY:  You are encouraged to cough and deep breath or use your incentive spirometer if you were given one, every 15-30 minutes when awake.  This will help prevent respiratory complications and low grade fevers post-operatively if  you had a general anesthetic.  You may want to hug a pillow when coughing and sneezing to add additional support to the surgical area, if you had abdominal or chest surgery, which will decrease pain during these times.  You are encouraged to walk and engage in light activity for the next two weeks.  You should not lift more than 20 pounds, for 6 weeks after surgery as it could put you at increased risk for complications.  Twenty pounds is roughly equivalent to a plastic bag of groceries. At that time- Listen to your body when lifting, if you have pain when lifting, stop and then try again in a few days. Soreness after doing exercises or activities of daily living is normal as you get back in to your normal routine.  MEDICATIONS:  Try to take narcotic medications and anti-inflammatory medications, such as tylenol, ibuprofen, naprosyn, etc., with food.  This will minimize stomach upset from the medication.  Should you develop nausea and vomiting from the pain medication, or develop a rash, please discontinue the medication and contact your physician.  You should not drive, make important decisions, or operate machinery when taking narcotic pain medication.  SUNBLOCK Use sun block to incision area over the next year if this area will be exposed to sun. This helps decrease scarring and will allow you avoid a  permanent darkened area over your incision.  QUESTIONS:  Please feel free to call our office if you have any questions, and we will be glad to assist you.

## 2020-02-21 NOTE — Progress Notes (Signed)
02/21/2020  HPI: Jeffrey Guerrero is a 69 y.o. male s/p robotic assisted incarcerated left inguinal hernia repair with mesh on 4/27.  He was admitted for observation due to the longer duration of the case and complexity given the large hernia during which three sigmoid colon serosal tears were repaired.  He was discharged on 4/28 in good condition.  He called yesterday and moved his appointment sooner because he has noted swelling of the left groin and scrotum.  Denies any worsening pain.  Has been compliant with the activity restrictions.  Vital signs: BP (!) 148/83   Pulse 86   Temp 97.7 F (36.5 C)   Resp 14   Ht 6\' 1"  (1.854 m)   Wt 203 lb 12.8 oz (92.4 kg)   SpO2 97%   BMI 26.89 kg/m    Physical Exam: Constitutional:  No acute distress Abdomen:  Soft, non-distended, non-tender to palpation except with more forceful pressure.  The left groin and scrotum have swelling and firmness compatible with scar and fluid.  I do not palpate any intestine in the groin area, and on straining/coughing, there is no evidence of recurrence.  Assessment/Plan: This is a 69 y.o. male s/p robotic assisted incarcerated left inguinal hernia repair.  --Discussed with the patient that I do not palpate any hernia recurrence at this point.  I think this is more likely as a result of scarring and fluid that's building up in the empty space left by his large hernia.  Recommended that he apply ice to the area intermittently and continue with the jockstrap to continue with support and compression of the area.  He will follow up in two weeks, and if there is no improvement or there is any worsening, discussed with him that we would obtain a CT scan to evaluate further.  He's in agreement with this plan and all of his questions have been answered.   Melvyn Neth, [facility]

## 2020-02-28 ENCOUNTER — Encounter: Payer: BC Managed Care – PPO | Admitting: Surgery

## 2020-03-08 ENCOUNTER — Ambulatory Visit (INDEPENDENT_AMBULATORY_CARE_PROVIDER_SITE_OTHER): Payer: Self-pay | Admitting: Surgery

## 2020-03-08 ENCOUNTER — Encounter: Payer: Self-pay | Admitting: Surgery

## 2020-03-08 ENCOUNTER — Other Ambulatory Visit: Payer: Self-pay

## 2020-03-08 VITALS — BP 163/95 | HR 75 | Temp 98.1°F | Resp 12 | Wt 208.4 lb

## 2020-03-08 DIAGNOSIS — E119 Type 2 diabetes mellitus without complications: Secondary | ICD-10-CM | POA: Diagnosis not present

## 2020-03-08 DIAGNOSIS — R1032 Left lower quadrant pain: Secondary | ICD-10-CM

## 2020-03-08 DIAGNOSIS — Z8719 Personal history of other diseases of the digestive system: Secondary | ICD-10-CM

## 2020-03-08 DIAGNOSIS — K4090 Unilateral inguinal hernia, without obstruction or gangrene, not specified as recurrent: Secondary | ICD-10-CM

## 2020-03-08 DIAGNOSIS — Z9889 Other specified postprocedural states: Secondary | ICD-10-CM

## 2020-03-08 NOTE — Progress Notes (Signed)
03/08/2020  HPI: Jeffrey Guerrero is a 69 y.o. male s/p robotic left inguinal hernia repair on 02/13/2020 for an incarcerated left inguinal hernia.  He was seen in the office on 5/5 as he presented complaining of swelling of the left scrotum and groin with an area that was firm to palpation.  He presents today for follow-up and reports that he still has the same swelling and discomfort.  He does not feel like the area has improved over the last 2 weeks.  Denies any worsening pain.  Vital signs: BP (!) 163/95   Pulse 75   Temp 98.1 F (36.7 C)   Resp 12   Wt 208 lb 6.4 oz (94.5 kg)   SpO2 97%   BMI 27.50 kg/m    Physical Exam: Constitutional: No acute distress Abdomen: Robotic port incisions are healing well and are clean dry and intact.  The patient on the left groin has an area of firmness and swelling extending from the distal part of the groin towards the scrotum.  This area measures about 10 cm x 4 cm.  I do not palpate a hernia there is no changes to this area with Valsalva or coughing.  Assessment/Plan: This is a 69 y.o. male s/p robotic left inguinal hernia repair.  -Discussed with the patient that this could still be scarring and swelling from postoperative changes given the large hernia sac that the patient had.  However to reassure him and to make sure that there is no hernia recurrence, I will order a CT scan of the abdomen pelvis with contrast to evaluate for any recurrence or any other potential complications from his surgery.  We will call him with the results afterwards.   Melvyn Neth, [facility]

## 2020-03-08 NOTE — Patient Instructions (Addendum)
Patient has been scheduled for a CT abdomen/pelvis with contrast at [facility] on [address] for scan at 11:30a (arrive 11:15a). Prep: NPO 4 hours prior and pick up prep kit. Patient verbalizes understanding.  Dr.Piscoya will contact patient once his results are received via Telephone.

## 2020-03-10 ENCOUNTER — Other Ambulatory Visit: Payer: Self-pay | Admitting: Family Medicine

## 2020-03-14 ENCOUNTER — Ambulatory Visit
Admission: RE | Admit: 2020-03-14 | Discharge: 2020-03-14 | Disposition: A | Discharge status: Home / Self Care | Payer: BC Managed Care – PPO | Source: Ambulatory Visit | Attending: Surgery | Admitting: Surgery

## 2020-03-14 ENCOUNTER — Other Ambulatory Visit: Payer: Self-pay

## 2020-03-14 DIAGNOSIS — R1032 Left lower quadrant pain: Secondary | ICD-10-CM | POA: Insufficient documentation

## 2020-03-14 DIAGNOSIS — R1909 Other intra-abdominal and pelvic swelling, mass and lump: Secondary | ICD-10-CM | POA: Diagnosis not present

## 2020-03-14 MED ORDER — IOHEXOL 300 MG/ML  SOLN
100.0000 mL | Freq: Once | INTRAMUSCULAR | Status: AC | PRN
  Administered 2020-03-14: 100 mL via INTRAVENOUS

## 2020-03-15 ENCOUNTER — Ambulatory Visit (HOSPITAL_COMMUNITY): Payer: BC Managed Care – PPO | Attending: Cardiology

## 2020-03-15 DIAGNOSIS — Z954 Presence of other heart-valve replacement: Secondary | ICD-10-CM

## 2020-03-19 ENCOUNTER — Telehealth: Payer: Self-pay | Admitting: Emergency Medicine

## 2020-03-19 DIAGNOSIS — K4090 Unilateral inguinal hernia, without obstruction or gangrene, not specified as recurrent: Secondary | ICD-10-CM | POA: Diagnosis not present

## 2020-03-19 DIAGNOSIS — I10 Essential (primary) hypertension: Secondary | ICD-10-CM | POA: Diagnosis not present

## 2020-03-19 DIAGNOSIS — E1165 Type 2 diabetes mellitus with hyperglycemia: Secondary | ICD-10-CM | POA: Diagnosis not present

## 2020-03-19 NOTE — Telephone Encounter (Signed)
Pt appt made 03/27/20 at 10:45am with Zach and 04/26/20 at 9am with Dr Hampton Abbot.  Pt made aware of these appts.

## 2020-03-19 NOTE — Telephone Encounter (Signed)
-----   Message from [name], MD sent at 03/15/2020  6:10 PM EDT ----- Regarding: follow up Hi,  This patient had a CT scan today and I called him to discuss the results.  Can you please set up the following appointments:  1.  Appt with Thedore Mins for the week of June 7th.  Patient would like to be seen before getting released to go back to work (6 weeks on 6/8).  2.  Appt with me for the first week of July, so I can follow up on his left groin seroma as noted on his CT scan.  Thanks!  [employer]

## 2020-03-19 NOTE — Telephone Encounter (Signed)
Called pt to make following appts. NO answer left vm to call the office back.  Will need to make appts once pt calls back.

## 2020-03-27 ENCOUNTER — Other Ambulatory Visit: Payer: Self-pay

## 2020-03-27 ENCOUNTER — Ambulatory Visit (INDEPENDENT_AMBULATORY_CARE_PROVIDER_SITE_OTHER): Payer: Self-pay | Admitting: Physician Assistant

## 2020-03-27 ENCOUNTER — Encounter: Payer: Self-pay | Admitting: Physician Assistant

## 2020-03-27 VITALS — BP 150/93 | HR 93 | Temp 97.9°F | Resp 12 | Ht 73.0 in | Wt 204.0 lb

## 2020-03-27 DIAGNOSIS — Z09 Encounter for follow-up examination after completed treatment for conditions other than malignant neoplasm: Secondary | ICD-10-CM

## 2020-03-27 DIAGNOSIS — K4030 Unilateral inguinal hernia, with obstruction, without gangrene, not specified as recurrent: Secondary | ICD-10-CM

## 2020-03-27 NOTE — Patient Instructions (Addendum)

## 2020-03-27 NOTE — Progress Notes (Signed)
[facility] POST-OP OFFICE VISIT  03/27/2020  HPI: Jeffrey Guerrero is a 69 y.o. male 61 days s/p robotic assisted laparoscopic incarcerated left inguinal hernia repair with Dr Hampton Abbot. He has been having issues with post-operative swelling and pain in his left groin and scrotum. He underwent CT Scan of this on 05/28 which was concerning for hematoma/seroma without evidence of recurrence.   He reports he is doing better Pain has subsided, and he is no longer requiring pain medications Swelling in his left groin/scrotum has slowly started to improve, he continues wearing compressive underwear No fever, chills, nausea, or emesis No issues with PO intake Normal bowel function Mobilizing well, wants to return to work  Vital signs: BP (!) 150/93   Pulse 93   Temp 97.9 F (36.6 C) (Oral)   Resp 12   Ht 6\' 1"  (1.854 m)   Wt 204 lb (92.5 kg)   SpO2 92%   BMI 26.91 kg/m    Physical Exam: Constitutional: Well appearing male, NAD Abdomen: Soft, non-tender, non-distended, no rebound/guarding.  Genitourinary: Mild swelling to the left scrotum, testicle palpable, no pain Skin: Laparoscopic incisions are well healed, no erythema or drainage   Imaging:   CT Abdomen/Pelvis (05/28):  IMPRESSION: 1. Large complex fluid collection in the right inguinal canal tracking caudally into the upper left hemiscrotum measuring approximately 8.3 x 6.3 x 8.4 cm, favored to represent a postoperative seroma/hematoma. Given the rim enhancement and surrounding inflammatory changes, the possibility of infection of this fluid collection is not excluded. 2. Small omental infarct in the left lower quadrant adjacent to the left inguinal herniorrhaphy site. 3. Mass-like thickening of the distal rectum. This portion is relatively decompressed, which could cause over estimation of bowel wall thickening. However, correlation with nonemergent colonoscopy is suggested in the near future to better  evaluate this finding and exclude the possibility of underlying neoplasm. 4. Areas of ill-defined hyperenhancement noted in the liver, largest of which is a wedge-shaped area in the periphery of segment 8. These are incompletely characterized on today's examination. Although these may simply represent benign perfusion anomalies (transient hepatic attenuation differences (THADs) these should be further evaluated with nonemergent abdominal MRI with and without IV gadolinium to exclude the possibility of aggressive lesions. 5. Aortic atherosclerosis.   Assessment/Plan: This is a 69 y.o. male 66 days s/p robotic assisted laparoscopic incarcerated left inguinal hernia repair   - Did not require any refill of pain medications, no longer taking these  - No issues with wounds  - Encouraged to continue to wear compressive underwear while swelling resolves  - Return to work not provided  - - RTC in 1 month as scheduled with Dr Hampton Abbot   -- Edison Simon, PA-C [facility] 03/27/2020, 10:53 AM [phone] M-F: 7am - 4pm

## 2020-04-03 ENCOUNTER — Telehealth: Payer: Self-pay | Admitting: Emergency Medicine

## 2020-04-03 NOTE — Telephone Encounter (Signed)
Shay from [employer] called stating pt was to return work on 03/26/20 but now is returning work 03/31/20, is asking why. Advised pt was seen 03/27/20 with Thedore Mins, [place] and he was the one that wrote note. Isaias Sakai is asking for last encounter note. Elsworth Soho that the lady that does medical records is on lunch and will speak to her about this. Advised Shay to send a fax requesting notes. She voiced understanding.  Case number [id number] Fax: [phone]

## 2020-04-08 ENCOUNTER — Ambulatory Visit: Payer: 59 | Admitting: Family Medicine

## 2020-04-26 ENCOUNTER — Encounter: Payer: Self-pay | Admitting: Surgery

## 2020-04-26 ENCOUNTER — Ambulatory Visit (INDEPENDENT_AMBULATORY_CARE_PROVIDER_SITE_OTHER): Payer: Self-pay | Admitting: Surgery

## 2020-04-26 ENCOUNTER — Other Ambulatory Visit: Payer: Self-pay

## 2020-04-26 VITALS — BP 154/85 | HR 74 | Temp 97.5°F | Resp 12 | Ht 72.0 in | Wt 208.0 lb

## 2020-04-26 DIAGNOSIS — Z09 Encounter for follow-up examination after completed treatment for conditions other than malignant neoplasm: Secondary | ICD-10-CM

## 2020-04-26 DIAGNOSIS — K4030 Unilateral inguinal hernia, with obstruction, without gangrene, not specified as recurrent: Secondary | ICD-10-CM

## 2020-04-26 NOTE — Patient Instructions (Signed)
Please call our office if you have any questions or concerns. You may resume your normal activities.

## 2020-04-26 NOTE — Progress Notes (Signed)
04/26/2020  HPI: Jeffrey Guerrero is a 69 y.o. male s/p robotic left inguinal hernia repair on 02/13/20 for an incarcerated left inguinal hernia containing sigmoid colon.  He developed a post-operative large seroma in the left groin and scrotum.  It was not improving by 5/21 and a CT scan was ordered which confirmed the findings of seroma.  He was seen in the office on 6/9 at which point he reported that his pain had basically resolved and the swelling from the seroma was starting to improve.  Today he reports the swelling is almost gone.  Denies any pain.  Vital signs: BP (!) 154/85   Pulse 74   Temp (!) 97.5 F (36.4 C) (Oral)   Resp 12   Ht 6' (1.829 m)   Wt 208 lb (94.3 kg)   SpO2 95%   BMI 28.21 kg/m    Physical Exam: Constitutional:  No acute distress Abdomen:  Soft, non-distended, non-tender to palpation.  The left groin and scrotal swelling from before is significantly improved.  There is some minimal swelling/firmness still present in the scrotum mostly, consistent with some residual fluid and scarring.  No tenderness to palpation.  Incisions are well healed.  Assessment/Plan: This is a 68 y.o. male s/p robotic left inguinal hernia repair  --Patient is doing very well and continue to improve.   --Discussed with him that he can stop wearing the compression underwear if he wishes, or may wear it for comfort. --May return to work with full duties as tolerated. --Follow up with [place] prn.   Melvyn Neth, [facility]

## 2020-08-01 ENCOUNTER — Encounter (HOSPITAL_COMMUNITY): Payer: Self-pay | Admitting: *Deleted

## 2020-08-01 ENCOUNTER — Other Ambulatory Visit: Payer: Self-pay

## 2020-08-01 ENCOUNTER — Emergency Department (HOSPITAL_COMMUNITY): Payer: BC Managed Care – PPO

## 2020-08-01 ENCOUNTER — Emergency Department (HOSPITAL_COMMUNITY)
Admission: EM | Admit: 2020-08-01 | Discharge: 2020-08-01 | Disposition: A | Discharge status: Home / Self Care | Payer: BC Managed Care – PPO | Attending: Emergency Medicine | Admitting: Emergency Medicine

## 2020-08-01 DIAGNOSIS — I10 Essential (primary) hypertension: Secondary | ICD-10-CM | POA: Insufficient documentation

## 2020-08-01 DIAGNOSIS — Z79899 Other long term (current) drug therapy: Secondary | ICD-10-CM | POA: Insufficient documentation

## 2020-08-01 DIAGNOSIS — R42 Dizziness and giddiness: Secondary | ICD-10-CM | POA: Diagnosis not present

## 2020-08-01 DIAGNOSIS — Z7984 Long term (current) use of oral hypoglycemic drugs: Secondary | ICD-10-CM | POA: Diagnosis not present

## 2020-08-01 DIAGNOSIS — S2241XA Multiple fractures of ribs, right side, initial encounter for closed fracture: Secondary | ICD-10-CM | POA: Diagnosis not present

## 2020-08-01 DIAGNOSIS — S2231XA Fracture of one rib, right side, initial encounter for closed fracture: Secondary | ICD-10-CM | POA: Diagnosis not present

## 2020-08-01 DIAGNOSIS — E119 Type 2 diabetes mellitus without complications: Secondary | ICD-10-CM | POA: Insufficient documentation

## 2020-08-01 DIAGNOSIS — I2699 Other pulmonary embolism without acute cor pulmonale: Secondary | ICD-10-CM | POA: Diagnosis not present

## 2020-08-01 DIAGNOSIS — Z7982 Long term (current) use of aspirin: Secondary | ICD-10-CM | POA: Insufficient documentation

## 2020-08-01 DIAGNOSIS — E039 Hypothyroidism, unspecified: Secondary | ICD-10-CM | POA: Diagnosis not present

## 2020-08-01 DIAGNOSIS — Y92410 Unspecified street and highway as the place of occurrence of the external cause: Secondary | ICD-10-CM | POA: Diagnosis not present

## 2020-08-01 DIAGNOSIS — S299XXA Unspecified injury of thorax, initial encounter: Secondary | ICD-10-CM | POA: Diagnosis not present

## 2020-08-01 DIAGNOSIS — M7732 Calcaneal spur, left foot: Secondary | ICD-10-CM | POA: Diagnosis not present

## 2020-08-01 DIAGNOSIS — M7989 Other specified soft tissue disorders: Secondary | ICD-10-CM | POA: Diagnosis not present

## 2020-08-01 DIAGNOSIS — M1611 Unilateral primary osteoarthritis, right hip: Secondary | ICD-10-CM | POA: Diagnosis not present

## 2020-08-01 DIAGNOSIS — M79651 Pain in right thigh: Secondary | ICD-10-CM | POA: Diagnosis not present

## 2020-08-01 DIAGNOSIS — M25751 Osteophyte, right hip: Secondary | ICD-10-CM | POA: Diagnosis not present

## 2020-08-01 LAB — CBC WITH DIFFERENTIAL/PLATELET
Abs Immature Granulocytes: 0.03 10*3/uL (ref 0.00–0.07)
Basophils Absolute: 0 10*3/uL (ref 0.0–0.1)
Basophils Relative: 0 %
Eosinophils Absolute: 0 10*3/uL (ref 0.0–0.5)
Eosinophils Relative: 0 %
HCT: 43.7 % (ref 39.0–52.0)
Hemoglobin: 14.2 g/dL (ref 13.0–17.0)
Immature Granulocytes: 0 %
Lymphocytes Relative: 10 %
Lymphs Abs: 0.9 10*3/uL (ref 0.7–4.0)
MCH: 29.3 pg (ref 26.0–34.0)
MCHC: 32.5 g/dL (ref 30.0–36.0)
MCV: 90.3 fL (ref 80.0–100.0)
Monocytes Absolute: 0.7 10*3/uL (ref 0.1–1.0)
Monocytes Relative: 8 %
Neutro Abs: 7.2 10*3/uL (ref 1.7–7.7)
Neutrophils Relative %: 82 %
Platelets: 191 10*3/uL (ref 150–400)
RBC: 4.84 MIL/uL (ref 4.22–5.81)
RDW: 14.8 % (ref 11.5–15.5)
WBC: 8.7 10*3/uL (ref 4.0–10.5)
nRBC: 0 % (ref 0.0–0.2)

## 2020-08-01 LAB — BASIC METABOLIC PANEL
Anion gap: 10 (ref 5–15)
BUN: 9 mg/dL (ref 8–23)
CO2: 26 mmol/L (ref 22–32)
Calcium: 9.4 mg/dL (ref 8.9–10.3)
Chloride: 104 mmol/L (ref 98–111)
Creatinine, Ser: 0.97 mg/dL (ref 0.61–1.24)
GFR, Estimated: >60 mL/min (ref >60)
Glucose, Bld: 130 mg/dL — ABNORMAL HIGH (ref 70–99)
Potassium: 3.5 mmol/L (ref 3.5–5.1)
Sodium: 140 mmol/L (ref 135–145)

## 2020-08-01 MED ORDER — IBUPROFEN 600 MG PO TABS: 600.0000 mg | ORAL_TABLET | Freq: Four times a day (QID) | ORAL | 0 refills | Status: AC | PRN

## 2020-08-01 MED ORDER — HYDROCODONE-ACETAMINOPHEN 5-325 MG PO TABS
1.0000 | ORAL_TABLET | Freq: Once | ORAL | Status: AC
  Administered 2020-08-01: 1 via ORAL
  Filled 2020-08-01: qty 1

## 2020-08-01 MED ORDER — HYDROCODONE-ACETAMINOPHEN 5-325 MG PO TABS: 1.0000 | ORAL_TABLET | ORAL | 0 refills | Status: AC | PRN

## 2020-08-01 MED ORDER — METHOCARBAMOL 500 MG PO TABS: 500.0000 mg | ORAL_TABLET | Freq: Two times a day (BID) | ORAL | 0 refills | Status: AC
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
## 2020-08-04 ENCOUNTER — Emergency Department (HOSPITAL_COMMUNITY): Payer: BC Managed Care – PPO

## 2020-08-04 ENCOUNTER — Encounter (HOSPITAL_COMMUNITY): Payer: Self-pay | Admitting: Emergency Medicine

## 2020-08-04 ENCOUNTER — Emergency Department (HOSPITAL_COMMUNITY)
Admission: EM | Admit: 2020-08-04 | Discharge: 2020-08-04 | Disposition: A | Discharge status: Home / Self Care | Payer: BC Managed Care – PPO | Attending: Emergency Medicine | Admitting: Emergency Medicine

## 2020-08-04 DIAGNOSIS — Z79899 Other long term (current) drug therapy: Secondary | ICD-10-CM | POA: Insufficient documentation

## 2020-08-04 DIAGNOSIS — R059 Cough, unspecified: Secondary | ICD-10-CM | POA: Diagnosis not present

## 2020-08-04 DIAGNOSIS — R042 Hemoptysis: Secondary | ICD-10-CM | POA: Diagnosis not present

## 2020-08-04 DIAGNOSIS — Z79890 Hormone replacement therapy (postmenopausal): Secondary | ICD-10-CM | POA: Insufficient documentation

## 2020-08-04 DIAGNOSIS — Z7984 Long term (current) use of oral hypoglycemic drugs: Secondary | ICD-10-CM | POA: Insufficient documentation

## 2020-08-04 DIAGNOSIS — E119 Type 2 diabetes mellitus without complications: Secondary | ICD-10-CM | POA: Diagnosis not present

## 2020-08-04 DIAGNOSIS — E039 Hypothyroidism, unspecified: Secondary | ICD-10-CM | POA: Insufficient documentation

## 2020-08-04 DIAGNOSIS — I10 Essential (primary) hypertension: Secondary | ICD-10-CM | POA: Insufficient documentation

## 2020-08-04 DIAGNOSIS — Z7982 Long term (current) use of aspirin: Secondary | ICD-10-CM | POA: Diagnosis not present

## 2020-08-04 DIAGNOSIS — R079 Chest pain, unspecified: Secondary | ICD-10-CM | POA: Diagnosis not present

## 2020-08-04 NOTE — ED Provider Notes (Signed)
[place] EMERGENCY DEPARTMENT Provider Note   CSN: [id number] Arrival date & time: 08/04/20  1246     History Chief Complaint  Patient presents with  . Rib Injury    Jeffrey Guerrero is a 69 y.o. male.  69 yo M with a chief complaints of hemoptysis.  This been going on for the past day.  Occurs when he coughs really forcefully.  He thinks he might have some bleeding from his injury that he had a few days ago.  The patient unfortunately had suffered a couple broken ribs and was seen in the ED.  Had a CT scan at that time.  He denies any worsening difficulty breathing denies fevers.  The history is provided by the patient.  Chest Pain Pain location:  R chest Pain quality: sharp   Pain radiates to:  Does not radiate Pain severity:  Mild Onset quality:  Gradual Duration:  2 days Timing:  Constant Progression:  Unchanged Chronicity:  New Relieved by:  Nothing Worsened by:  Nothing Ineffective treatments:  None tried Associated symptoms: no abdominal pain, no fever, no headache, no palpitations, no shortness of breath and no vomiting        Past Medical History:  Diagnosis Date  . Arthritis    "knees" (02/13/2014)  . 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 Diabetes mellitus without complication ([place])    takes metformin  . Hypertension   . Hypothyroidism   . Pulmonic valve insufficiency 02/16/2014   severe  . S/P pulmonic valve replacement with homograft 11/01/2014  . Thyroid disease     Patient Active Problem List   Diagnosis Date Noted  . Incarcerated left inguinal hernia 02/13/2020  . Neuropathy of finger of right hand 04/25/2019  . Polyneuropathy associated with underlying disease ([place]) 04/25/2019  . Diabetes mellitus ([place]) 04/25/2019  . Hypothyroidism 10/05/2017   . Controlled type 2 diabetes mellitus without complication, without long-term current use of insulin ([place]) 04/07/2017  . Dyslipidemia 04/07/2017  . S/P pulmonic valve replacement with homograft 11/01/2014  . Preoperative cardiovascular examination   . Severe pulmonic insufficiency   . Adult hypothyroidism 06/12/2014  . Hyperglycemia 04/02/2014  . Pulmonic valve disease 04/02/2014  . Multiple skin nodules 04/02/2014  . Acute pulmonary embolism ([place]) 02/17/2014  . Other pulmonary embolism and infarction 02/17/2014  . Bacterial endocarditis - pulmonic valve vegetation with Streptococcus pneumoniae sepsis 02/16/2014  . Pulmonic valve insufficiency 02/16/2014  . Essential hypertension, benign 02/14/2014  . Leukocytopenia, unspecified 02/14/2014  . Gram-positive bacteremia 02/14/2014  . ARF (acute renal failure) ([place]) 02/14/2014  . HTN (hypertension) 02/14/2014  . Benign essential HTN 02/14/2014  . Septic shock ([place]) 02/12/2014    Past Surgical History:  Procedure Laterality Date  . AORTIC VALVE REPLACEMENT [name] 11/01/2014   Procedure: PULMONIC HOMOGRAFT VALVE REPLACEMENT ;  Surgeon: Rexene Alberts, MD;  Location: [place];  Service: Open Heart Surgery;  Laterality: [name];  . APPENDECTOMY    . COLONOSCOPY    . COLONOSCOPY WITH PROPOFOL [name] 11/05/2017   Procedure: COLONOSCOPY WITH PROPOFOL;  Surgeon: [name], MD;  Location: [facility] ENDOSCOPY;  Service: Gastroenterology;  Laterality: [name];  . LEFT HEART CATHETERIZATION WITH CORONARY ANGIOGRAM [name] 10/09/2014   Procedure: LEFT HEART CATHETERIZATION WITH CORONARY ANGIOGRAM;  Surgeon: Jettie Booze, MD;  Location: [facility] CATH LAB;  Service: Cardiovascular;  Laterality: [name];  .  TEE WITHOUT CARDIOVERSION [name] 02/16/2014   Procedure: TRANSESOPHAGEAL ECHOCARDIOGRAM (TEE);  Surgeon: [name], MD;  Location: [facility] ENDOSCOPY;  Service: Cardiovascular;  Laterality: [name];  . TEE WITHOUT CARDIOVERSION [name] 04/12/2014   Procedure: TRANSESOPHAGEAL ECHOCARDIOGRAM  (TEE);  Surgeon: [name], MD;  Location: [place];  Service: Cardiovascular;  Laterality: [name];  . TEE WITHOUT CARDIOVERSION [name] 11/01/2014   Procedure: TRANSESOPHAGEAL ECHOCARDIOGRAM (TEE);  Surgeon: Rexene Alberts, MD;  Location: [place];  Service: Open Heart Surgery;  Laterality: [name];  . TONSILLECTOMY    . XI ROBOTIC ASSISTED INGUINAL HERNIA REPAIR WITH MESH Left 02/13/2020   Procedure: XI ROBOTIC ASSISTED INGUINAL HERNIA REPAIR WITH MESH;  Surgeon: [name], MD;  Location: [facility] ORS;  Service: General;  Laterality: Left;       Family History  Problem Relation Age of Onset  . Heart disease Neg Hx     Social History   Tobacco Use  . Smoking status: Never Smoker  . Smokeless tobacco: Never Used  Vaping Use  . Vaping Use: Never used  Substance Use Topics  . Alcohol use: Yes    Comment: occasional  . Drug use: No    Home Medications Prior to Admission medications   Medication Sig Start Date End Date Taking? Authorizing Provider  amLODipine (NORVASC) 10 MG tablet Take 1 tablet (10 mg total) by mouth daily. 10/09/19 01/29/21  Forrest Moron, MD  aspirin 81 MG tablet Take 1 tablet (81 mg total) by mouth daily. 10/06/18   Forrest Moron, MD  atorvastatin (LIPITOR) 20 MG tablet Take 1 tablet (20 mg total) by mouth daily. 10/09/19   Forrest Moron, MD  cholecalciferol (VITAMIN D3) 25 MCG (1000 UNIT) tablet Take 1,000 Units by mouth at bedtime.     [provider]  cyanocobalamin 2000 MCG tablet Take 2,000 mcg by mouth at bedtime.    [provider]  folic acid (FOLVITE) 270 MCG tablet Take 400 mcg by mouth at bedtime.    [provider]  HYDROcodone-acetaminophen (NORCO/VICODIN) 5-325 MG tablet Take 1-2 tablets by mouth every 4 (four) hours as needed. 08/01/20   Sponseller, Eugene Garnet R, PA-C  ibuprofen (ADVIL) 600 MG tablet Take 1 tablet (600 mg total) by mouth every 6 (six) hours as needed. 08/01/20   Sponseller, Gypsy Balsam, PA-C  levothyroxine  (SYNTHROID) 100 MCG tablet TAKE 1 TABLET (100 MCG TOTAL) BY MOUTH DAILY BEFORE BREAKFAST. 01/11/20   Delia Chimes A, MD  meloxicam (MOBIC) 7.5 MG tablet Take 1 tablet (7.5mg ) by mouth daily as needed with food. No other NSAIDs. Patient taking differently: Take 7.5 mg by mouth daily as needed for pain.  11/21/19   Forrest Moron, MD  metFORMIN (GLUCOPHAGE) 500 MG tablet TAKE 1 TABLET BY MOUTH TWICE A DAY WITH MEALS Patient taking differently: Take 500 mg by mouth 2 (two) times daily with a meal.  11/10/19   Forrest Moron, MD  methocarbamol (ROBAXIN) 500 MG tablet Take 1 tablet (500 mg total) by mouth 2 (two) times daily. 08/01/20   Sponseller, Eugene Garnet R, PA-C  pyridOXINE (VITAMIN B-6) 100 MG tablet Take 100 mg by mouth at bedtime.    [provider]    Allergies    Patient has no known allergies.  Review of Systems   Review of Systems  Constitutional: Negative for chills and fever.  HENT: Negative for congestion and facial swelling.   Eyes: Negative for discharge and visual disturbance.  Respiratory: Negative for shortness of breath.  Cardiovascular: Positive for chest pain. Negative for palpitations.  Gastrointestinal: Negative for abdominal pain, diarrhea and vomiting.  Musculoskeletal: Negative for arthralgias and myalgias.  Skin: Negative for color change and rash.  Neurological: Negative for tremors, syncope and headaches.  Psychiatric/Behavioral: Negative for confusion and dysphoric mood.    Physical Exam Updated Vital Signs BP (!) 152/82 (BP Location: Right Arm)   Pulse 62   Temp 98.4 F (36.9 C) (Oral)   Resp 13   SpO2 98%   Physical Exam Vitals and nursing note reviewed.  Constitutional:      Appearance: He is well-developed.  HENT:     Head: Normocephalic and atraumatic.  Eyes:     Pupils: Pupils are equal, round, and reactive to light.  Neck:     Vascular: No JVD.  Cardiovascular:     Rate and Rhythm: Normal rate and regular rhythm.     Heart  sounds: No murmur heard.  No friction rub. No gallop.   Pulmonary:     Effort: No respiratory distress.     Breath sounds: No wheezing.  Abdominal:     General: There is no distension.     Tenderness: There is no abdominal tenderness. There is no guarding or rebound.  Musculoskeletal:        General: Normal range of motion.     Cervical back: Normal range of motion and neck supple.  Skin:    Coloration: Skin is not pale.     Findings: No rash.  Neurological:     Mental Status: He is alert and oriented to person, place, and time.  Psychiatric:        Behavior: Behavior normal.     ED Results / Procedures / Treatments   Labs (all labs ordered are listed, but only abnormal results are displayed) Labs Reviewed - No data to display  EKG None  Radiology No results found.  Procedures Procedures (including critical care time)  Medications Ordered in ED Medications - No data to display  ED Course  I have reviewed the triage vital signs and the nursing notes.  Pertinent labs & imaging results that were available during my care of the patient were reviewed by me and considered in my medical decision making (see chart for details).    MDM Rules/Calculators/A&P                          1 y M with a chief complaints of hemoptysis.  This could be due to forceful coughing.  He has clear lung sounds for me.  We will repeat a chest x-ray here.  As the patient has no significant trouble breathing or fever if the x-ray is normal I do not feel that further work-up is likely needed.  We will have him follow-up with his family doctor in the office and have him return for worsening.  CXR viewed by me without hemothorax, ptx, or focal infiltrate.  D/c home.  5:19 PM:  I have discussed the diagnosis/risks/treatment options with the patient and family and believe the pt to be eligible for discharge home to follow-up with PCP. We also discussed returning to the ED immediately if new or  worsening sx occur. We discussed the sx which are most concerning (e.g., sob, fever, worsening pain) that necessitate immediate return. Medications administered to the patient during their visit and any new prescriptions provided to the patient are listed below.  Medications given during this visit Medications - No  data to display   The patient appears reasonably screen and/or stabilized for discharge and I doubt any other medical condition or other [facility] requiring further screening, evaluation, or treatment in the ED at this time prior to discharge.     Final Clinical Impression(s) / ED Diagnoses Final diagnoses:  None    Rx / DC Orders ED Discharge Orders    None       Deno Etienne, DO 08/04/20 1719

## 2020-08-04 NOTE — Discharge Instructions (Signed)
Follow up with your family doc.  Return for worsening trouble breathing or fever
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
## 2020-08-10 DIAGNOSIS — Z1331 Encounter for screening for depression: Secondary | ICD-10-CM | POA: Diagnosis not present

## 2020-08-10 DIAGNOSIS — S2231XA Fracture of one rib, right side, initial encounter for closed fracture: Secondary | ICD-10-CM | POA: Diagnosis not present

## 2020-08-10 DIAGNOSIS — S8012XA Contusion of left lower leg, initial encounter: Secondary | ICD-10-CM | POA: Diagnosis not present

## 2020-09-28 DIAGNOSIS — S2231XA Fracture of one rib, right side, initial encounter for closed fracture: Secondary | ICD-10-CM | POA: Diagnosis not present

## 2020-10-08 ENCOUNTER — Encounter: Payer: 59 | Admitting: Family Medicine

## 2020-10-14 DIAGNOSIS — Z125 Encounter for screening for malignant neoplasm of prostate: Secondary | ICD-10-CM | POA: Diagnosis not present

## 2020-10-14 DIAGNOSIS — E1165 Type 2 diabetes mellitus with hyperglycemia: Secondary | ICD-10-CM | POA: Diagnosis not present

## 2020-11-11 ENCOUNTER — Other Ambulatory Visit: Payer: Self-pay

## 2020-11-11 ENCOUNTER — Encounter: Payer: Self-pay | Admitting: Family Medicine

## 2020-11-11 ENCOUNTER — Ambulatory Visit (INDEPENDENT_AMBULATORY_CARE_PROVIDER_SITE_OTHER): Payer: Medicare HMO

## 2020-11-11 ENCOUNTER — Ambulatory Visit (INDEPENDENT_AMBULATORY_CARE_PROVIDER_SITE_OTHER): Payer: Medicare HMO | Admitting: Family Medicine

## 2020-11-11 VITALS — BP 113/84 | HR 97 | Temp 98.4°F | Ht 72.0 in | Wt 211.0 lb

## 2020-11-11 DIAGNOSIS — M19011 Primary osteoarthritis, right shoulder: Secondary | ICD-10-CM | POA: Diagnosis not present

## 2020-11-11 DIAGNOSIS — S2241XA Multiple fractures of ribs, right side, initial encounter for closed fracture: Secondary | ICD-10-CM | POA: Diagnosis not present

## 2020-11-11 DIAGNOSIS — S2241XD Multiple fractures of ribs, right side, subsequent encounter for fracture with routine healing: Secondary | ICD-10-CM

## 2020-11-11 DIAGNOSIS — R0789 Other chest pain: Secondary | ICD-10-CM
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
## 2020-11-11 NOTE — Patient Instructions (Addendum)
I will let you know if there are any concerns on x-ray, but current symptoms may have been a flare of your previous fractures.  I expect those to be healing, but that can take some time.  Okay to continue treatment at home with the muscle relaxer, pain medication if needed this week, then plan to return to work with your previous restrictions on Monday.  I have completed a letter for your employer.  If your symptoms are improving this week, you can return sooner with those restrictions.  Restrictions were continued for additional 2 weeks, but beyond that time should be discussed with your primary care provider.  Follow-up with us if needed.Thank you for coming in today and I hope you feel better.  Return to the clinic or go to the nearest emergency room if any of your symptoms worsen or new symptoms occur.   Rib Fracture  A rib fracture is a break or crack in one of the bones of the ribs. The ribs are long, curved bones that wrap around your chest and attach to your spine and your breastbone. The ribs protect your heart, lungs, and other organs in the chest. A broken or cracked rib is often painful but is not usually serious. Most rib fractures heal on their own over time. However, rib fractures can be more serious if multiple ribs are broken or if broken ribs move out of place and push against other structures or organs. What are the causes? This condition is caused by:  Repetitive movements with high force, such as pitching a baseball or having very bad coughing spells.  A direct hit the chest, such as a sports injury, a car crash, or a fall.  Cancer that has spread to the bones, which can weaken bones and cause them to break. What are the signs or symptoms? Symptoms of this condition include:  Pain when you breathe in or cough.  Pain when someone presses on the injured area.  Feeling short of breath. How is this diagnosed? This condition is diagnosed with a physical exam and medical  history. You may also have imaging tests, such as:  Chest X-ray.  CT scan.  MRI.  Bone scan.  Chest ultrasound. How is this treated? Treatment for this condition depends on the severity of the fracture. Most rib fractures usually heal on their own in 1-3 months. Healing may take longer if you have a cough or if you do activities that make the injury worse. While you heal, you may be given medicines to control the pain. You will also be taught deep breathing exercises. Severe injuries may require hospitalization or surgery. Follow these instructions at home: Managing pain, stiffness, and swelling  If directed, put ice on the injured area. To do this: ? Put ice in a plastic bag. ? Place a towel between your skin and the bag. ? Leave the ice on for 20 minutes, 2-3 times a day. ? Remove the ice if your skin turns bright red. This is very important. If you cannot feel pain, heat, or cold, you have a greater risk of damage to the area.  Take over-the-counter and prescription medicines only as told by your health care provider. Activity  Avoid doing activities or movements that cause pain. Be careful during activities and avoid bumping the injured rib.  Slowly increase your activity as told by your health care provider. General instructions  Do deep breathing exercises as told by your health care provider. This helps prevent pneumonia,  which is a common complication of a broken rib. Your health care provider may instruct you to: ? Take deep breaths several times a day. ? Try to cough several times a day, holding a pillow against the injured area. ? Use a device called incentive spirometer to practice deep breathing several times a day.  Drink enough fluid to keep your urine pale yellow.  Do not wear a rib belt or binder. These restrict breathing, which can lead to pneumonia.  Keep all follow-up visits. This is important. Contact a health care provider if:  You have a fever. Get  help right away if:  You have difficulty breathing or you are short of breath.  You develop a cough that does not stop, or you cough up thick or bloody sputum.  You have nausea, vomiting, or pain in your abdomen.  Your pain gets worse and medicine does not help. These symptoms may represent a serious problem that is an emergency. Do not wait to see if the symptoms will go away. Get medical help right away. Call your local emergency services (911 in the [place] Do not drive yourself to the hospital. Summary  A rib fracture is a break or crack in one of the bones of the ribs.  A broken or cracked rib is often painful but is not usually serious.  Most rib fractures heal on their own over time.  Treatment for this condition depends on the severity of the fracture.  Avoid doing any activities or movements that cause pain. This information is not intended to replace advice given to you by your health care provider. Make sure you discuss any questions you have with your health care provider. Document Revised: 01/26/2020 Document Reviewed: 01/26/2020 Elsevier Patient Education  2021 [place].  Chest Wall Pain Chest wall pain is pain in or around the bones and muscles of your chest. Sometimes, an injury causes this pain. Excessive coughing or overuse of arm and chest muscles may also cause chest wall pain. Sometimes, the cause may not be known. This pain may take several weeks or longer to get better. Follow these instructions at home: Managing pain, stiffness, and swelling  If directed, put ice on the painful area: ? Put ice in a plastic bag. ? Place a towel between your skin and the bag. ? Leave the ice on for 20 minutes, 2-3 times per day.   Activity  Rest as told by your health care provider.  Avoid activities that cause pain. These include any activities that use your chest muscles or your abdominal and side muscles to lift heavy items. Ask your health care provider what  activities are safe for you. General instructions  Take over-the-counter and prescription medicines only as told by your health care provider.  Do not use any products that contain nicotine or tobacco, such as cigarettes, e-cigarettes, and chewing tobacco. These can delay healing after injury. If you need help quitting, ask your health care provider.  Keep all follow-up visits as told by your health care provider. This is important.   Contact a health care provider if:  You have a fever.  Your chest pain becomes worse.  You have new symptoms. Get help right away if:  You have nausea or vomiting.  You feel sweaty or light-headed.  You have a cough with mucus from your lungs (sputum) or you cough up blood.  You develop shortness of breath. These symptoms may represent a serious problem that is an emergency. Do not  wait to see if the symptoms will go away. Get medical help right away. Call your local emergency services (911 in the [place] Do not drive yourself to the hospital. Summary  Chest wall pain is pain in or around the bones and muscles of your chest.  Depending on the cause, it may be treated with ice, rest, medicines, and avoiding activities that cause pain.  Contact a health care provider if you have a fever, worsening chest pain, or new symptoms.  Get help right away if you feel light-headed or you develop shortness of breath. These symptoms may be an emergency. This information is not intended to replace advice given to you by your health care provider. Make sure you discuss any questions you have with your health care provider. Document Revised: 04/07/2018 Document Reviewed: 04/07/2018 Elsevier Patient Education  2021 [employer].    If you have lab work done today you will be contacted with your lab results within the next 2 weeks.  If you have not heard from [place] then please contact us. The fastest way to get your results is to register for My Chart.   IF you  received an x-ray today, you will receive an invoice from [facility]. Please contact [facility]iology at [phone] with questions or concerns regarding your invoice.   IF you received labwork today, you will receive an invoice from [place]. Please contact [facility] at [phone] with questions or concerns regarding your invoice.   Our billing staff will not be able to assist you with questions regarding bills from these companies.  You will be contacted with the lab results as soon as they are available. The fastest way to get your results is to activate your My Chart account. Instructions are located on the last page of this paperwork. If you have not heard from [place] regarding the results in 2 weeks, please contact this office.

## 2020-11-18 ENCOUNTER — Encounter: Payer: Self-pay | Admitting: Internal Medicine

## 2020-11-18 ENCOUNTER — Other Ambulatory Visit: Payer: Self-pay

## 2020-11-18 ENCOUNTER — Ambulatory Visit: Payer: Medicare HMO | Admitting: Internal Medicine

## 2020-11-18 VITALS — BP 126/80 | HR 67 | Ht 72.0 in | Wt 205.2 lb

## 2020-11-18 DIAGNOSIS — I330 Acute and subacute infective endocarditis: Secondary | ICD-10-CM

## 2020-11-18 DIAGNOSIS — E785 Hyperlipidemia, unspecified: Secondary | ICD-10-CM

## 2020-11-18 DIAGNOSIS — I379 Nonrheumatic pulmonary valve disorder, unspecified: Secondary | ICD-10-CM | POA: Diagnosis not present

## 2020-11-18 DIAGNOSIS — R609 Edema, unspecified: Secondary | ICD-10-CM | POA: Diagnosis not present

## 2020-11-18 NOTE — Patient Instructions (Addendum)
Medication Instructions:  No changes *If you need a refill on your cardiac medications before your next appointment, please call your pharmacy*   Lab Work: CBC, BMET, BNP, LIPID- today  If you have labs (blood work) drawn today and your tests are completely normal, you will receive your results only by: Marland Kitchen MyChart Message (if you have MyChart) OR . A paper copy in the mail If you have any lab test that is abnormal or we need to change your treatment, we will call you to review the results.   Testing/Procedures:  ECHO is due in [place] prior to next appt Your physician has requested that you have an echocardiogram. Echocardiography is a painless test that uses sound waves to create images of your heart. It provides your doctor with information about the size and shape of your heart and how well your heart's chambers and valves are working. This procedure takes approximately one hour. There are no restrictions for this procedure.    Follow-Up: At [facility], you and your health needs are our priority.  As part of our continuing mission to provide you with exceptional heart care, we have created designated Provider Care Teams.  These Care Teams include your primary Cardiologist (physician) and Advanced Practice Providers (APPs -  Physician Assistants and Nurse Practitioners) who all work together to provide you with the care you need, when you need it.   Your next appointment:   8 month(s) (September, 2022--w echocardiogram prior) The format for your next appointment:   In Person  Provider:   You may see Dorris Carnes, MD or one of the following Advanced Practice Providers on your designated Care Team:    Richardson Dopp, PA-C  Robbie Lis, [place]   Other Instructions
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
## 2020-11-19 ENCOUNTER — Telehealth: Payer: Self-pay | Admitting: Internal Medicine

## 2020-11-19 LAB — BASIC METABOLIC PANEL
BUN/Creatinine Ratio: 15 (ref 10–24)
BUN: 15 mg/dL (ref 8–27)
CO2: 24 mmol/L (ref 20–29)
Calcium: 9.4 mg/dL (ref 8.6–10.2)
Chloride: 104 mmol/L (ref 96–106)
Creatinine, Ser: 1.03 mg/dL (ref 0.76–1.27)
GFR calc Af Amer: 84 mL/min/{1.73_m2} (ref >59)
GFR calc non Af Amer: 73 mL/min/{1.73_m2} (ref >59)
Glucose: 88 mg/dL (ref 65–99)
Potassium: 4 mmol/L (ref 3.5–5.2)
Sodium: 141 mmol/L (ref 134–144)

## 2020-11-19 LAB — CBC
Hematocrit: 44.9 % (ref 37.5–51.0)
Hemoglobin: 14.9 g/dL (ref 13.0–17.7)
MCH: 29.8 pg (ref 26.6–33.0)
MCHC: 33.2 g/dL (ref 31.5–35.7)
MCV: 90 fL (ref 79–97)
Platelets: 190 10*3/uL (ref 150–450)
RBC: 5 x10E6/uL (ref 4.14–5.80)
RDW: 13.9 % (ref 11.6–15.4)
WBC: 3.7 10*3/uL (ref 3.4–10.8)

## 2020-11-19 LAB — LIPID PANEL
Chol/HDL Ratio: 3.2 ratio (ref 0.0–5.0)
Cholesterol, Total: 159 mg/dL (ref 100–199)
HDL: 49 mg/dL (ref >39)
LDL Chol Calc (NIH): 91 mg/dL (ref 0–99)
Triglycerides: 102 mg/dL (ref 0–149)
VLDL Cholesterol Cal: 19 mg/dL (ref 5–40)

## 2020-11-19 LAB — PRO B NATRIURETIC PEPTIDE: NT-Pro BNP: 130 pg/mL (ref 0–376)

## 2020-11-19 NOTE — Telephone Encounter (Signed)
Returned call to patient's wife and provided information for El[facility] Therapy.

## 2020-11-19 NOTE — Telephone Encounter (Signed)
Jeffrey Guerrero is calling stating Jeffrey Guerrero was supposed to get a phone number from Dr. [name] at his appointment to call for him to get diabetic socks, but it was not given to him.  She is requesting a callback to be given the number and states a VM can be left with it if she does not answer. Please advise.

## 2020-11-25 ENCOUNTER — Other Ambulatory Visit: Payer: Self-pay

## 2020-11-25 ENCOUNTER — Ambulatory Visit: Payer: Medicare HMO | Admitting: Family Medicine

## 2020-11-25 ENCOUNTER — Encounter: Payer: Self-pay | Admitting: Family Medicine

## 2020-11-25 VITALS — BP 146/80 | HR 70 | Temp 98.4°F | Ht 72.0 in | Wt 207.0 lb

## 2020-11-25 DIAGNOSIS — Z86711 Personal history of pulmonary embolism: Secondary | ICD-10-CM | POA: Diagnosis not present

## 2020-11-25 DIAGNOSIS — M79604 Pain in right leg: Secondary | ICD-10-CM | POA: Diagnosis not present

## 2020-11-25 DIAGNOSIS — R7989 Other specified abnormal findings of blood chemistry: Secondary | ICD-10-CM

## 2020-11-25 NOTE — Patient Instructions (Addendum)
  Okay to continue gentle stretches of the hamstring muscle as well as the calf muscle.  I do not appreciate any difference in swelling on the right leg versus the left leg but I will check a blood clot test today.  If that is elevated I will order an ultrasound.  If any chest pains, shortness of breath, or any acute worsening symptoms be seen at urgent care or the emergency room.  If leg pain is not improving in the next week to 10 days with gentle stretches, follow-up and we can look at other causes.  Return to the clinic or go to the nearest emergency room if any of your symptoms worsen or new symptoms occur.    If you have lab work done today you will be contacted with your lab results within the next 2 weeks.  If you have not heard from [place] then please contact us. The fastest way to get your results is to register for My Chart.   IF you received an x-ray today, you will receive an invoice from [facility] Radiology. Please contact [facility] Radiology at [phone] with questions or concerns regarding your invoice.   IF you received labwork today, you will receive an invoice from [place]. Please contact [facility] at [phone] with questions or concerns regarding your invoice.   Our billing staff will not be able to assist you with questions regarding bills from these companies.  You will be contacted with the lab results as soon as they are available. The fastest way to get your results is to activate your My Chart account. Instructions are located on the last page of this paperwork. If you have not heard from [place] regarding the results in 2 weeks, please contact this office.
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
## 2020-11-26 ENCOUNTER — Telehealth: Payer: Self-pay | Admitting: Emergency Medicine

## 2020-11-26 ENCOUNTER — Encounter: Payer: Self-pay | Admitting: Family Medicine

## 2020-11-26 LAB — D-DIMER, QUANTITATIVE: D-DIMER: 4.75 mg/L FEU — ABNORMAL HIGH (ref 0.00–0.49)

## 2020-11-26 NOTE — Telephone Encounter (Signed)
Left detail message on machine and a message for patient to return call so I know he have recieved this message and he do not have any other question at this time. Message left on machine was that we have schedule a appt with [facility]. Appt is scheduled for 11/27/20 @ 8:00 am. Please give [place] a call so I know he received this appt.

## 2020-11-26 NOTE — Addendum Note (Signed)
Addended by: Norton Blizzard R on: 06/27/688 02:58 PM   Modules accepted: Orders

## 2020-11-27 ENCOUNTER — Ambulatory Visit (HOSPITAL_COMMUNITY)
Admission: RE | Admit: 2020-11-27 | Discharge: 2020-11-27 | Disposition: A | Discharge status: Home / Self Care | Payer: Medicare HMO | Source: Ambulatory Visit | Attending: Family Medicine | Admitting: Family Medicine

## 2020-11-27 ENCOUNTER — Other Ambulatory Visit: Payer: Self-pay

## 2020-11-27 DIAGNOSIS — M79604 Pain in right leg: Secondary | ICD-10-CM | POA: Insufficient documentation

## 2020-11-27 DIAGNOSIS — R7989 Other specified abnormal findings of blood chemistry: Secondary | ICD-10-CM | POA: Insufficient documentation

## 2020-11-27 NOTE — Progress Notes (Signed)
Right lower extremity venous study completed.   Spoke with Dr. Carlota Raspberry to provide results.    Please see CV Proc for preliminary results.   Vonzell Schlatter, RVT

## 2020-11-28 ENCOUNTER — Telehealth: Payer: Self-pay | Admitting: Family Medicine

## 2020-11-28 NOTE — Telephone Encounter (Signed)
Patient called because we had left a voice mail/ spoke with clinical to make sure I hadn't missed anything / patient was returning message from 11/27/2020 /   Patient is requesting that we call him when we have recent ultrasound results

## 2020-11-29 NOTE — Telephone Encounter (Signed)
I have called pt and his wife back. Spoke to his wife and gave her the results of the ultra-sound. Pt wife stated understanding.   Ultrasound Results.  "Good news.  Ultrasound did not indicate any DVT.  Gentle stretching of the leg as we discussed at his office visit, follow-up if not improving.  Sooner if worse "

## 2020-12-04 ENCOUNTER — Telehealth (HOSPITAL_COMMUNITY): Payer: Self-pay | Admitting: *Deleted

## 2020-12-04 NOTE — Telephone Encounter (Signed)
Referral from [facility] came in for patient to sch therapy appt. Staff called patient and was not able to reach him and patient phone does not have a voicemail set up. Staff called Delores with refering office and informed her that staff was not able to reach patient and she verbalized understanding and stated she will get patient to call office back to sch appt.

## 2020-12-05 DIAGNOSIS — R69 Illness, unspecified: Secondary | ICD-10-CM | POA: Diagnosis not present

## 2020-12-16 ENCOUNTER — Encounter: Payer: Self-pay | Admitting: *Deleted

## 2020-12-24 ENCOUNTER — Telehealth: Payer: Self-pay | Admitting: Internal Medicine

## 2020-12-24 DIAGNOSIS — E1142 Type 2 diabetes mellitus with diabetic polyneuropathy: Secondary | ICD-10-CM

## 2020-12-24 DIAGNOSIS — E785 Hyperlipidemia, unspecified: Secondary | ICD-10-CM

## 2020-12-24 MED ORDER — ROSUVASTATIN CALCIUM 20 MG PO TABS: 20.0000 mg | ORAL_TABLET | Freq: Every day | ORAL | 3 refills | Status: AC

## 2020-12-24 NOTE — Telephone Encounter (Signed)
Lab results:   CBC is normal Electrolytes and kiney function are normal Fluid overall appears OK LDL is OK  He had mild plaquing at cath  With that and DM and the fact that he had surgery would be better lower  WOuld switch to Crestor 20 mg  F/U lipids and AST in 8 wks Watch salt intake  for swellng  Limit  ___________________________________________________ Pt's wife called in response to letter sent.  Has been notified of recommendations.  Will change atorva to rosuvastatin and return 5/10 for lab work.  Discussed limiting sodium and diet choices.

## 2020-12-24 NOTE — Telephone Encounter (Signed)
New message:    Patient calling stating some one called him concerning some lab work. I did not see a note. Please advise.

## 2020-12-30 DIAGNOSIS — R69 Illness, unspecified: Secondary | ICD-10-CM | POA: Diagnosis not present

## 2020-12-31 DIAGNOSIS — R69 Illness, unspecified: Secondary | ICD-10-CM | POA: Diagnosis not present

## 2021-01-10 DIAGNOSIS — R69 Illness, unspecified: Secondary | ICD-10-CM | POA: Diagnosis not present

## 2021-01-22 DIAGNOSIS — E119 Type 2 diabetes mellitus without complications: Secondary | ICD-10-CM | POA: Diagnosis not present

## 2021-01-22 DIAGNOSIS — M25561 Pain in right knee: Secondary | ICD-10-CM | POA: Diagnosis not present

## 2021-01-22 DIAGNOSIS — Z1331 Encounter for screening for depression: Secondary | ICD-10-CM | POA: Diagnosis not present

## 2021-01-24 DIAGNOSIS — R69 Illness, unspecified: Secondary | ICD-10-CM | POA: Diagnosis not present

## 2021-01-31 DIAGNOSIS — R69 Illness, unspecified: Secondary | ICD-10-CM | POA: Diagnosis not present

## 2021-02-20 ENCOUNTER — Other Ambulatory Visit: Payer: Self-pay | Admitting: Family Medicine

## 2021-02-20 DIAGNOSIS — M25561 Pain in right knee: Secondary | ICD-10-CM

## 2021-02-25 ENCOUNTER — Other Ambulatory Visit: Payer: Medicare HMO | Admitting: *Deleted

## 2021-02-25 ENCOUNTER — Other Ambulatory Visit: Payer: Self-pay

## 2021-02-25 DIAGNOSIS — E1142 Type 2 diabetes mellitus with diabetic polyneuropathy: Secondary | ICD-10-CM

## 2021-02-25 DIAGNOSIS — E785 Hyperlipidemia, unspecified: Secondary | ICD-10-CM

## 2021-02-25 LAB — HEPATIC FUNCTION PANEL
ALT: 20 IU/L (ref 0–44)
AST: 17 IU/L (ref 0–40)
Albumin: 4.2 g/dL (ref 3.7–4.7)
Alkaline Phosphatase: 59 IU/L (ref 44–121)
Bilirubin Total: 0.2 mg/dL (ref 0.0–1.2)
Bilirubin, Direct: 0.1 mg/dL (ref 0.00–0.40)
Total Protein: 6.9 g/dL (ref 6.0–8.5)

## 2021-02-25 LAB — LIPID PANEL
Chol/HDL Ratio: 2.4 ratio (ref 0.0–5.0)
Cholesterol, Total: 122 mg/dL (ref 100–199)
HDL: 50 mg/dL (ref >39)
LDL Chol Calc (NIH): 61 mg/dL (ref 0–99)
Triglycerides: 47 mg/dL (ref 0–149)
VLDL Cholesterol Cal: 11 mg/dL (ref 5–40)

## 2021-03-01 ENCOUNTER — Other Ambulatory Visit: Payer: Medicare HMO

## 2021-03-14 DIAGNOSIS — R69 Illness, unspecified: Secondary | ICD-10-CM | POA: Diagnosis not present

## 2021-03-21 ENCOUNTER — Other Ambulatory Visit: Payer: Self-pay | Admitting: Family Medicine

## 2021-03-21 ENCOUNTER — Ambulatory Visit
Admission: RE | Admit: 2021-03-21 | Discharge: 2021-03-21 | Disposition: A | Discharge status: Home / Self Care | Payer: Medicare HMO | Source: Ambulatory Visit | Attending: Family Medicine | Admitting: Family Medicine

## 2021-03-21 DIAGNOSIS — M25561 Pain in right knee: Secondary | ICD-10-CM | POA: Diagnosis not present

## 2021-03-28 DIAGNOSIS — M25561 Pain in right knee: Secondary | ICD-10-CM | POA: Diagnosis not present

## 2021-04-25 DIAGNOSIS — M25561 Pain in right knee: Secondary | ICD-10-CM | POA: Diagnosis not present

## 2021-04-28 ENCOUNTER — Ambulatory Visit (HOSPITAL_COMMUNITY): Payer: Medicare HMO | Attending: Cardiovascular Disease

## 2021-04-28 ENCOUNTER — Other Ambulatory Visit: Payer: Self-pay

## 2021-04-28 DIAGNOSIS — I379 Nonrheumatic pulmonary valve disorder, unspecified: Secondary | ICD-10-CM | POA: Diagnosis not present

## 2021-04-28 DIAGNOSIS — I330 Acute and subacute infective endocarditis: Secondary | ICD-10-CM | POA: Insufficient documentation

## 2021-04-28 DIAGNOSIS — E785 Hyperlipidemia, unspecified: Secondary | ICD-10-CM | POA: Diagnosis not present

## 2021-04-28 LAB — ECHOCARDIOGRAM COMPLETE
Area-P 1/2: 4.46 cm2
S' Lateral: 2.8 cm

## 2021-04-30 ENCOUNTER — Other Ambulatory Visit: Payer: Self-pay | Admitting: *Deleted

## 2021-04-30 DIAGNOSIS — I379 Nonrheumatic pulmonary valve disorder, unspecified: Secondary | ICD-10-CM

## 2021-04-30 NOTE — Progress Notes (Signed)
Cardiac mri ordered to eval pulmonic valve in March 2023 per Dr. [name].

## 2021-04-30 NOTE — Progress Notes (Signed)
Order place for cardiac MR in March 2023.

## 2021-05-06 ENCOUNTER — Telehealth: Payer: Self-pay | Admitting: Internal Medicine

## 2021-05-06 NOTE — Telephone Encounter (Signed)
Pt is returning a call about results.He states he is at work and will have to keep calling back

## 2021-05-12 NOTE — Telephone Encounter (Signed)
The patient MRI that is due in March 2023 was scheduled for August 2.  I have spoken with his wife and cancelled this.  She is aware they will be contacted to arrange again closer to March 2023.   She thanked me for the call.

## 2021-05-20 ENCOUNTER — Ambulatory Visit (HOSPITAL_COMMUNITY): Payer: Medicare HMO

## 2021-06-06 DIAGNOSIS — R69 Illness, unspecified: Secondary | ICD-10-CM | POA: Diagnosis not present

## 2021-07-04 DIAGNOSIS — H2513 Age-related nuclear cataract, bilateral: Secondary | ICD-10-CM | POA: Diagnosis not present

## 2021-07-04 DIAGNOSIS — E119 Type 2 diabetes mellitus without complications: Secondary | ICD-10-CM | POA: Diagnosis not present

## 2021-07-04 DIAGNOSIS — H524 Presbyopia: Secondary | ICD-10-CM | POA: Diagnosis not present

## 2021-07-04 DIAGNOSIS — Z0100 Encounter for examination of eyes and vision without abnormal findings: Secondary | ICD-10-CM | POA: Diagnosis not present

## 2021-07-04 DIAGNOSIS — H5203 Hypermetropia, bilateral: Secondary | ICD-10-CM | POA: Diagnosis not present

## 2021-07-19 DIAGNOSIS — E039 Hypothyroidism, unspecified: Secondary | ICD-10-CM | POA: Diagnosis not present

## 2021-07-19 DIAGNOSIS — I379 Nonrheumatic pulmonary valve disorder, unspecified: Secondary | ICD-10-CM | POA: Diagnosis not present

## 2021-07-19 DIAGNOSIS — I10 Essential (primary) hypertension: Secondary | ICD-10-CM | POA: Diagnosis not present

## 2021-07-19 DIAGNOSIS — M179 Osteoarthritis of knee, unspecified: Secondary | ICD-10-CM | POA: Diagnosis not present

## 2021-07-19 DIAGNOSIS — E119 Type 2 diabetes mellitus without complications: Secondary | ICD-10-CM | POA: Diagnosis not present

## 2021-07-19 DIAGNOSIS — R69 Illness, unspecified: Secondary | ICD-10-CM | POA: Diagnosis not present

## 2021-07-19 DIAGNOSIS — Z86010 Personal history of colonic polyps: Secondary | ICD-10-CM | POA: Diagnosis not present

## 2021-07-19 DIAGNOSIS — G4700 Insomnia, unspecified: Secondary | ICD-10-CM | POA: Diagnosis not present

## 2021-07-19 DIAGNOSIS — E785 Hyperlipidemia, unspecified: Secondary | ICD-10-CM | POA: Diagnosis not present

## 2021-08-02 DIAGNOSIS — I10 Essential (primary) hypertension: Secondary | ICD-10-CM | POA: Diagnosis not present

## 2021-08-02 DIAGNOSIS — I2510 Atherosclerotic heart disease of native coronary artery without angina pectoris: Secondary | ICD-10-CM | POA: Diagnosis not present

## 2021-08-02 DIAGNOSIS — E039 Hypothyroidism, unspecified: Secondary | ICD-10-CM | POA: Diagnosis not present

## 2021-08-02 DIAGNOSIS — E1140 Type 2 diabetes mellitus with diabetic neuropathy, unspecified: Secondary | ICD-10-CM | POA: Diagnosis not present

## 2021-08-02 DIAGNOSIS — G8929 Other chronic pain: Secondary | ICD-10-CM | POA: Diagnosis not present

## 2021-08-02 DIAGNOSIS — M1990 Unspecified osteoarthritis, unspecified site: Secondary | ICD-10-CM | POA: Diagnosis not present

## 2021-08-02 DIAGNOSIS — R69 Illness, unspecified: Secondary | ICD-10-CM | POA: Diagnosis not present

## 2021-08-02 DIAGNOSIS — F419 Anxiety disorder, unspecified: Secondary | ICD-10-CM | POA: Diagnosis not present

## 2021-08-02 DIAGNOSIS — G4700 Insomnia, unspecified: Secondary | ICD-10-CM | POA: Diagnosis not present

## 2021-08-02 DIAGNOSIS — F3341 Major depressive disorder, recurrent, in partial remission: Secondary | ICD-10-CM | POA: Diagnosis not present

## 2021-08-02 DIAGNOSIS — Z7982 Long term (current) use of aspirin: Secondary | ICD-10-CM | POA: Diagnosis not present

## 2021-08-02 DIAGNOSIS — E785 Hyperlipidemia, unspecified: Secondary | ICD-10-CM | POA: Diagnosis not present

## 2021-08-02 DIAGNOSIS — Z7984 Long term (current) use of oral hypoglycemic drugs: Secondary | ICD-10-CM | POA: Diagnosis not present

## 2021-08-28 DIAGNOSIS — F4310 Post-traumatic stress disorder, unspecified: Secondary | ICD-10-CM | POA: Diagnosis not present

## 2021-08-28 DIAGNOSIS — R69 Illness, unspecified: Secondary | ICD-10-CM | POA: Diagnosis not present

## 2021-12-19 ENCOUNTER — Telehealth: Payer: Self-pay | Admitting: *Deleted

## 2021-12-19 DIAGNOSIS — Z01812 Encounter for preprocedural laboratory examination: Secondary | ICD-10-CM

## 2021-12-19 NOTE — Telephone Encounter (Signed)
Spoke w Jeffrey Guerrero.  Confirmed he is aware of appointment for MRI on 3/9.  Aware he will need lab work prior to that.  He will come by the office Monday afternoon 12/22/21 for the blood work. ?

## 2021-12-19 NOTE — Telephone Encounter (Signed)
-----   Message from Eli Hose, RN sent at 12/19/2021  2:24 PM EST ----- ?Can this patient get a H and H placed and drawn prior to his MRI on 3/9? ? ?Thanks, ?Merle ?

## 2021-12-22 ENCOUNTER — Other Ambulatory Visit: Payer: Self-pay

## 2021-12-22 ENCOUNTER — Other Ambulatory Visit: Payer: Medicare HMO | Admitting: *Deleted

## 2021-12-22 DIAGNOSIS — Z01812 Encounter for preprocedural laboratory examination: Secondary | ICD-10-CM | POA: Diagnosis not present

## 2021-12-22 LAB — HEMOGLOBIN AND HEMATOCRIT, BLOOD
Hematocrit: 39.2 % (ref 37.5–51.0)
Hemoglobin: 13.7 g/dL (ref 13.0–17.7)

## 2021-12-24 ENCOUNTER — Telehealth (HOSPITAL_COMMUNITY): Payer: Self-pay | Admitting: *Deleted

## 2021-12-24 NOTE — Telephone Encounter (Signed)
Attempted to call patient regarding upcoming cardiac MRI appointment. Left message on voicemail with name and callback number  Reigan Tolliver RN Navigator Cardiac Imaging [facility] [phone] Office [phone] Cell  

## 2021-12-25 ENCOUNTER — Other Ambulatory Visit: Payer: Self-pay

## 2021-12-25 ENCOUNTER — Ambulatory Visit (HOSPITAL_COMMUNITY)
Admission: RE | Admit: 2021-12-25 | Discharge: 2021-12-25 | Disposition: A | Discharge status: Home / Self Care | Payer: Medicare HMO | Source: Ambulatory Visit | Attending: Internal Medicine | Admitting: Internal Medicine

## 2021-12-25 DIAGNOSIS — I379 Nonrheumatic pulmonary valve disorder, unspecified: Secondary | ICD-10-CM | POA: Insufficient documentation

## 2021-12-25 MED ORDER — GADOBUTROL 1 MMOL/ML IV SOLN
10.0000 mL | Freq: Once | INTRAVENOUS | Status: AC | PRN
  Administered 2021-12-25: 17:00:00 10 mL via INTRAVENOUS

## 2022-02-16 ENCOUNTER — Telehealth: Payer: Self-pay

## 2022-02-16 NOTE — Telephone Encounter (Signed)
I spoke with the pts daughter, Luetta Nutting, and she verbalized understanding of his cardiac MRI and will bring him in to see Dr [name] 02/24/22.  ?

## 2022-02-16 NOTE — Telephone Encounter (Signed)
Attempted to call the pt re: his Cardiac MRI.... reviewed with the DOD Dr Burt Knack... pt to make an appt with Dr [name] for full review...  ? ?Note: pts wife passed away recently;y and funeral planned for this week 02/19/22.  ?

## 2022-02-24 ENCOUNTER — Ambulatory Visit (INDEPENDENT_AMBULATORY_CARE_PROVIDER_SITE_OTHER): Payer: Medicare HMO | Admitting: Internal Medicine

## 2022-02-24 ENCOUNTER — Encounter: Payer: Self-pay | Admitting: Internal Medicine

## 2022-02-24 VITALS — BP 120/72 | HR 70 | Ht 72.0 in | Wt 188.4 lb

## 2022-02-24 DIAGNOSIS — I10 Essential (primary) hypertension: Secondary | ICD-10-CM | POA: Diagnosis not present
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
## 2022-02-24 NOTE — Patient Instructions (Signed)
Medication Instructions:  ?Your physician recommends that you continue on your current medications as directed. Please refer to the Current Medication list given to you today. ? ?*If you need a refill on your cardiac medications before your next appointment, please call your pharmacy* ? ? ?Lab Work: ?none ?If you have labs (blood work) drawn today and your tests are completely normal, you will receive your results only by: ?MyChart Message (if you have MyChart) OR ?A paper copy in the mail ?If you have any lab test that is abnormal or we need to change your treatment, we will call you to review the results. ? ? ?Testing/Procedures: ?none ? ? ?Follow-Up: ?At [facility], you and your health needs are our priority.  As part of our continuing mission to provide you with exceptional heart care, we have created designated Provider Care Teams.  These Care Teams include your primary Cardiologist (physician) and Advanced Practice Providers (APPs -  Physician Assistants and Nurse Practitioners) who all work together to provide you with the care you need, when you need it. ? ?We recommend signing up for the patient portal called "MyChart".  Sign up information is provided on this After Visit Summary.  MyChart is used to connect with patients for Virtual Visits (Telemedicine).  Patients are able to view lab/test results, encounter notes, upcoming appointments, etc.  Non-urgent messages can be sent to your provider as well.   ?To learn more about what you can do with MyChart, go to [email or web address].   ? ?Your next appointment:   ?7 month(s) ? ?The format for your next appointment:   ?In Person ? ?Provider:   ?Dorris Carnes, MD   ? ? ?Other Instructions ? ? ?Important Information About Sugar ? ? ? ? ?  ?

## 2022-02-28 DIAGNOSIS — I10 Essential (primary) hypertension: Secondary | ICD-10-CM | POA: Diagnosis not present

## 2022-02-28 DIAGNOSIS — N529 Male erectile dysfunction, unspecified: Secondary | ICD-10-CM | POA: Diagnosis not present

## 2022-03-21 DIAGNOSIS — E119 Type 2 diabetes mellitus without complications: Secondary | ICD-10-CM | POA: Diagnosis not present

## 2022-03-21 DIAGNOSIS — I10 Essential (primary) hypertension: Secondary | ICD-10-CM | POA: Diagnosis not present

## 2022-04-09 ENCOUNTER — Encounter: Payer: Self-pay | Admitting: Urology

## 2022-04-09 ENCOUNTER — Ambulatory Visit: Payer: Medicare HMO | Admitting: Urology

## 2022-04-09 VITALS — BP 154/94 | HR 75 | Ht 72.0 in | Wt 192.0 lb

## 2022-04-09 DIAGNOSIS — N5203 Combined arterial insufficiency and corporo-venous occlusive erectile dysfunction: Secondary | ICD-10-CM

## 2022-04-09 MED ORDER — TADALAFIL 20 MG PO TABS: ORAL_TABLET | ORAL | 3 refills | Status: AC

## 2022-04-09 NOTE — Progress Notes (Signed)
04/09/2022 11:34 AM   Jeffrey Guerrero 04-Apr-1951 [id number]  Referring provider:  No referring provider defined for this encounter. Chief Complaint  Patient presents with   Erectile Dysfunction      HPI: Jeffrey Guerrero is a 71 y.o.male who presents today for further evaluation of erectile dysfunction.   He is managed on sildenafil 25 mg.  He reports that he was given this a while back, is taken up to 3 tablets at a time and does not for effective.  He can achieve erections with it but not satisfactory that last.  His libido is intact.  He he reports that he was sexually active up until April when his wife passed of pancreatic cancer.   SHIM     Row Name 04/09/22 1114         SHIM: Over the last 6 months:   How do you rate your confidence that you could get and keep an erection? Low     When you had erections with sexual stimulation, how often were your erections hard enough for penetration (entering your partner)? Almost Always or Always     During sexual intercourse, how often were you able to maintain your erection after you had penetrated (entered) your partner? Sometimes (about half the time)     During sexual intercourse, how difficult was it to maintain your erection to completion of intercourse? Not Difficult     When you attempted sexual intercourse, how often was it satisfactory for you? Almost Always or Always       SHIM Total Score   SHIM 20                PMH: Past Medical History:  Diagnosis Date   Arthritis    "knees" (02/13/2014)   Bacterial endocarditis - pulmonic valve vegetation with Streptococcus pneumoniae sepsis 02/16/2014   Pulmonic valve vegetations on TEE with blood cultures positive for Streptococcus pneumoniae, complicated by pulmonary emboli    Diabetes mellitus without complication ([place])    takes metformin   Hypertension    Hypothyroidism    Pulmonic valve insufficiency 02/16/2014   severe   S/P pulmonic valve replacement with homograft  11/01/2014   Thyroid disease     Surgical History: Past Surgical History:  Procedure Laterality Date   AORTIC VALVE REPLACEMENT [name] 11/01/2014   Procedure: PULMONIC HOMOGRAFT VALVE REPLACEMENT ;  Surgeon: Rexene Alberts, MD;  Location: [place];  Service: Open Heart Surgery;  Laterality: [name];   APPENDECTOMY     COLONOSCOPY     COLONOSCOPY WITH PROPOFOL [name] 11/05/2017   Procedure: COLONOSCOPY WITH PROPOFOL;  Surgeon: [name], MD;  Location: [facility] ENDOSCOPY;  Service: Gastroenterology;  Laterality: [name];   LEFT HEART CATHETERIZATION WITH CORONARY ANGIOGRAM [name] 10/09/2014   Procedure: LEFT HEART CATHETERIZATION WITH CORONARY ANGIOGRAM;  Surgeon: Jettie Booze, MD;  Location: [facility] CATH LAB;  Service: Cardiovascular;  Laterality: [name];   TEE WITHOUT CARDIOVERSION [name] 02/16/2014   Procedure: TRANSESOPHAGEAL ECHOCARDIOGRAM (TEE);  Surgeon: [name], MD;  Location: [facility] ENDOSCOPY;  Service: Cardiovascular;  Laterality: [name];   TEE WITHOUT CARDIOVERSION [name] 04/12/2014   Procedure: TRANSESOPHAGEAL ECHOCARDIOGRAM (TEE);  Surgeon: [name], MD;  Location: [place];  Service: Cardiovascular;  Laterality: [name];   TEE WITHOUT CARDIOVERSION [name] 11/01/2014   Procedure: TRANSESOPHAGEAL ECHOCARDIOGRAM (TEE);  Surgeon: Rexene Alberts, MD;  Location: [place];  Service: Open Heart Surgery;  Laterality: [name];   TONSILLECTOMY     XI ROBOTIC ASSISTED INGUINAL HERNIA REPAIR  WITH MESH Left 02/13/2020   Procedure: XI ROBOTIC ASSISTED INGUINAL HERNIA REPAIR WITH MESH;  Surgeon: [name], MD;  Location: [facility] ORS;  Service: General;  Laterality: Left;    Home Medications:  Allergies as of 04/09/2022   No Known Allergies      Medication List        Accurate as of April 09, 2022 11:34 AM. If you have any questions, ask your nurse or doctor.          STOP taking these medications    sildenafil 25 MG tablet Commonly known as: VIAGRA Stopped by: Hollice Espy, MD       TAKE these medications     amLODipine 10 MG tablet Commonly known as: NORVASC Take 1 tablet (10 mg total) by mouth daily.   aspirin 81 MG tablet Take 1 tablet (81 mg total) by mouth daily.   cholecalciferol 25 MCG (1000 UNIT) tablet Commonly known as: VITAMIN D3 Take 1,000 Units by mouth at bedtime.   cyanocobalamin 2000 MCG tablet Take 2,000 mcg by mouth at bedtime.   folic acid 637 MCG tablet Commonly known as: FOLVITE Take 400 mcg by mouth at bedtime.   HYDROcodone-acetaminophen 5-325 MG tablet Commonly known as: NORCO/VICODIN Take 1-2 tablets by mouth every 4 (four) hours as needed.   ibuprofen 600 MG tablet Commonly known as: ADVIL Take 1 tablet (600 mg total) by mouth every 6 (six) hours as needed.   levothyroxine 100 MCG tablet Commonly known as: SYNTHROID TAKE 1 TABLET (100 MCG TOTAL) BY MOUTH DAILY BEFORE BREAKFAST.   meloxicam 7.5 MG tablet Commonly known as: MOBIC Take 1 tablet (7.'5mg'$ ) by mouth daily as needed with food. No other NSAIDs.   metFORMIN 500 MG tablet Commonly known as: GLUCOPHAGE TAKE 1 TABLET BY MOUTH TWICE A DAY WITH MEALS   methocarbamol 500 MG tablet Commonly known as: ROBAXIN Take 1 tablet (500 mg total) by mouth 2 (two) times daily.   pyridOXINE 100 MG tablet Commonly known as: VITAMIN B-6 Take 100 mg by mouth at bedtime.   rosuvastatin 20 MG tablet Commonly known as: CRESTOR Take 1 tablet (20 mg total) by mouth daily.   sertraline 25 MG tablet Commonly known as: ZOLOFT sertraline 25 mg tablet  TAKE 1 TABLETS BY ORAL ROUTE PER DAY IN THE MORNING   tadalafil 20 MG tablet Commonly known as: CIALIS 1-5 tablets one hour prior to intercourse Started by: Hollice Espy, MD   traZODone 50 MG tablet Commonly known as: DESYREL trazodone 50 mg tablet  TAKE 1/2 TABLET BY MOUTH AT BEDTIME        Allergies:  No Known Allergies  Family History: Family History  Problem Relation Age of Onset   Heart disease Neg Hx     Social History:  reports that  he has never smoked. He has never used smokeless tobacco. He reports current alcohol use. He reports that he does not use drugs.   Physical Exam: BP (!) 154/94   Pulse 75   Ht 6' (1.829 m)   Wt 192 lb (87.1 kg)   BMI 26.04 kg/m   Constitutional:  Alert and oriented, No acute distress. HEENT: [place] AT, moist mucus membranes.  Trachea midline, no masses. Cardiovascular: No clubbing, cyanosis, or edema. Respiratory: Normal respiratory effort, no increased work of breathing. Skin: No rashes, bruises or suspicious lesions. Neurologic: Grossly intact, no focal deficits, moving all 4 extremities. Psychiatric: Normal mood and affect.  Laboratory Data:  Lab Results  Component Value Date  CREATININE 1.03 11/18/2020   Lab Results  Component Value Date   HGBA1C 6.0 (A) 10/09/2019   Assessment & Plan:    Erectile dysfunction, vasculogenic -Suspect there also may be a social/emotional component - We discussed the pathophysiology of erectile dysfunction today along with possible contributing factors. Discussed possible treatment options including PDE 5 inhibitors, vacuum erectile device, intracavernosal injection, MUSE, and placement of the inflatable or malleable penile prosthesis for refractory cases.  In terms of PDE 5 inhibitors, we discussed contraindications for this medication as well as common side effects. Patient was counseled on optimal use. All of his questions were answered in detail. - Will try him on Cialis 20 mg 2 hours prior to sexual intercourse - Cialis; prescribed   -Advised to let us know if this is not effective, if not, he may be interested in intracavernosal injections and teaching  I,Kailey Littlejohn,acting as a scribe for Hollice Espy, MD.,have documented all relevant documentation on the behalf of Hollice Espy, MD,as directed by  Hollice Espy, MD while in the presence of Hollice Espy, MD.  I have reviewed the above documentation for accuracy and  completeness, and I agree with the above.   Hollice Espy, MD    [facility] [address] [place], [place] [address] [phone]

## 2022-05-13 DIAGNOSIS — E291 Testicular hypofunction: Secondary | ICD-10-CM | POA: Diagnosis not present

## 2022-05-23 DIAGNOSIS — Z1211 Encounter for screening for malignant neoplasm of colon: Secondary | ICD-10-CM | POA: Diagnosis not present

## 2022-05-30 LAB — EXTERNAL GENERIC LAB PROCEDURE: COLOGUARD: NEGATIVE

## 2022-06-19 ENCOUNTER — Other Ambulatory Visit: Payer: Self-pay | Admitting: *Deleted

## 2022-06-19 ENCOUNTER — Encounter: Payer: Self-pay | Admitting: *Deleted

## 2022-06-19 NOTE — Patient Outreach (Signed)
  Care Coordination   06/19/2022 Name: Jeffrey Guerrero MRN: [id number] DOB: 03-Dec-1950   Care Coordination Outreach Attempts:  An unsuccessful telephone outreach was attempted today to offer the patient information about available care coordination services as a benefit of their health plan.   Follow Up Plan:  Additional outreach attempts will be made to offer the patient care coordination information and services.   Encounter Outcome:  No Answer  Care Coordination Interventions Activated:  No   Care Coordination Interventions:  No, not indicated    Raina Mina, RN Care Management Coordinator [place] Office [phone]

## 2022-06-19 NOTE — Telephone Encounter (Signed)
This encounter was created in error - please disregard.

## 2022-07-04 DIAGNOSIS — N5239 Other post-surgical erectile dysfunction: Secondary | ICD-10-CM | POA: Diagnosis not present

## 2022-07-04 DIAGNOSIS — I379 Nonrheumatic pulmonary valve disorder, unspecified: Secondary | ICD-10-CM | POA: Diagnosis not present

## 2022-07-04 DIAGNOSIS — I10 Essential (primary) hypertension: Secondary | ICD-10-CM | POA: Diagnosis not present

## 2022-07-04 DIAGNOSIS — E1169 Type 2 diabetes mellitus with other specified complication: Secondary | ICD-10-CM | POA: Diagnosis not present

## 2022-07-08 ENCOUNTER — Ambulatory Visit: Payer: Self-pay

## 2022-07-08 DIAGNOSIS — I10 Essential (primary) hypertension: Secondary | ICD-10-CM | POA: Diagnosis not present

## 2022-07-08 DIAGNOSIS — E785 Hyperlipidemia, unspecified: Secondary | ICD-10-CM | POA: Diagnosis not present

## 2022-07-08 DIAGNOSIS — Z7984 Long term (current) use of oral hypoglycemic drugs: Secondary | ICD-10-CM | POA: Diagnosis not present

## 2022-07-08 DIAGNOSIS — E119 Type 2 diabetes mellitus without complications: Secondary | ICD-10-CM | POA: Diagnosis not present

## 2022-07-08 DIAGNOSIS — E039 Hypothyroidism, unspecified: Secondary | ICD-10-CM | POA: Diagnosis not present

## 2022-07-08 DIAGNOSIS — R69 Illness, unspecified: Secondary | ICD-10-CM | POA: Diagnosis not present

## 2022-07-08 DIAGNOSIS — N529 Male erectile dysfunction, unspecified: Secondary | ICD-10-CM | POA: Diagnosis not present

## 2022-07-08 DIAGNOSIS — Z791 Long term (current) use of non-steroidal anti-inflammatories (NSAID): Secondary | ICD-10-CM | POA: Diagnosis not present

## 2022-07-08 DIAGNOSIS — Z7982 Long term (current) use of aspirin: Secondary | ICD-10-CM | POA: Diagnosis not present

## 2022-07-08 NOTE — Patient Outreach (Signed)
  Care Coordination   07/08/2022 Name: Jeffrey Guerrero MRN: [id number] DOB: [birth year]   Care Coordination Outreach Attempts:  A second unsuccessful outreach was attempted today to offer the patient with information about available care coordination services as a benefit of their health plan.     Follow Up Plan:  No further outreach attempts will be made at this time. We have been unable to contact the patient to offer or enroll patient in care coordination services  Encounter Outcome:  No Answer  Care Coordination Interventions Activated:  No   Care Coordination Interventions:  No, not indicated    Daneen Schick, BSW, CDP Social Worker, Certified Dementia Practitioner [place] [facility] [phone]

## 2022-07-15 ENCOUNTER — Telehealth: Payer: Self-pay

## 2022-07-15 NOTE — Patient Outreach (Signed)
  Care Coordination   07/15/2022 Name: Jeffrey Guerrero MRN: [id number] DOB: September 14, 1951   Care Coordination Outreach Attempts:  A third unsuccessful outreach was attempted today to offer the patient with information about available care coordination services as a benefit of their health plan.   Follow Up Plan:  No further outreach attempts will be made at this time. We have been unable to contact the patient to offer or enroll patient in care coordination services  Encounter Outcome:  No Answer  Care Coordination Interventions Activated:  No   Care Coordination Interventions:  No, not indicated    Peter Garter RN, BSN,CCM, [place] [facility] [phone]

## 2022-07-21 DIAGNOSIS — H5203 Hypermetropia, bilateral: Secondary | ICD-10-CM | POA: Diagnosis not present

## 2022-07-21 DIAGNOSIS — H524 Presbyopia: Secondary | ICD-10-CM | POA: Diagnosis not present

## 2022-07-21 DIAGNOSIS — H2513 Age-related nuclear cataract, bilateral: Secondary | ICD-10-CM | POA: Diagnosis not present

## 2022-07-21 DIAGNOSIS — H25013 Cortical age-related cataract, bilateral: Secondary | ICD-10-CM | POA: Diagnosis not present

## 2022-07-21 DIAGNOSIS — E119 Type 2 diabetes mellitus without complications: Secondary | ICD-10-CM | POA: Diagnosis not present

## 2022-08-01 DIAGNOSIS — I10 Essential (primary) hypertension: Secondary | ICD-10-CM | POA: Diagnosis not present

## 2022-08-01 DIAGNOSIS — E1169 Type 2 diabetes mellitus with other specified complication: Secondary | ICD-10-CM | POA: Diagnosis not present

## 2022-08-04 ENCOUNTER — Encounter: Payer: Self-pay | Admitting: Urology

## 2022-08-04 ENCOUNTER — Ambulatory Visit: Payer: Medicare HMO | Admitting: Urology

## 2022-08-04 VITALS — BP 155/82 | HR 83 | Ht 72.0 in | Wt 184.0 lb

## 2022-08-04 DIAGNOSIS — N5203 Combined arterial insufficiency and corporo-venous occlusive erectile dysfunction: Secondary | ICD-10-CM

## 2022-08-04 DIAGNOSIS — N486 Induration penis plastica: Secondary | ICD-10-CM | POA: Diagnosis not present

## 2022-08-04 MED ORDER — AMBULATORY NON FORMULARY MEDICATION: 0 refills | Status: AC

## 2022-08-04 NOTE — Progress Notes (Signed)
08/04/2022 1:01 PM   Jeffrey Guerrero 1951-06-20 [id number]  Referring provider: No referring provider defined for this encounter.  Chief Complaint  Patient presents with   Follow-up    6 month f/u ED    HPI: 71 year old male who returns today to further discuss his erectile dysfunction.  He has tried high-dose sildenafil as well as Cialis at this point in time without any effect.  He is interested in discussing further options.  He also mentions today that he has noticed curvature in his penis, about 30 degrees dorsally.  This is not bothersome to him, is not painful and does not affect his ability to orgasm or with penetration.   PMH: Past Medical History:  Diagnosis Date   Arthritis    "knees" (02/13/2014)   Bacterial endocarditis - pulmonic valve vegetation with Streptococcus pneumoniae sepsis 02/16/2014   Pulmonic valve vegetations on TEE with blood cultures positive for Streptococcus pneumoniae, complicated by pulmonary emboli    Diabetes mellitus without complication ([place])    takes metformin   Hypertension    Hypothyroidism    Pulmonic valve insufficiency 02/16/2014   severe   S/P pulmonic valve replacement with homograft 11/01/2014   Thyroid disease     Surgical History: Past Surgical History:  Procedure Laterality Date   AORTIC VALVE REPLACEMENT [name] 11/01/2014   Procedure: PULMONIC HOMOGRAFT VALVE REPLACEMENT ;  Surgeon: Rexene Alberts, MD;  Location: [place];  Service: Open Heart Surgery;  Laterality: [name];   APPENDECTOMY     COLONOSCOPY     COLONOSCOPY WITH PROPOFOL [name] 11/05/2017   Procedure: COLONOSCOPY WITH PROPOFOL;  Surgeon: [name], MD;  Location: [facility] ENDOSCOPY;  Service: Gastroenterology;  Laterality: [name];   LEFT HEART CATHETERIZATION WITH CORONARY ANGIOGRAM [name] 10/09/2014   Procedure: LEFT HEART CATHETERIZATION WITH CORONARY ANGIOGRAM;  Surgeon: Jettie Booze, MD;  Location: [facility] CATH LAB;  Service: Cardiovascular;  Laterality: [name];   TEE WITHOUT  CARDIOVERSION [name] 02/16/2014   Procedure: TRANSESOPHAGEAL ECHOCARDIOGRAM (TEE);  Surgeon: [name], MD;  Location: [facility] ENDOSCOPY;  Service: Cardiovascular;  Laterality: [name];   TEE WITHOUT CARDIOVERSION [name] 04/12/2014   Procedure: TRANSESOPHAGEAL ECHOCARDIOGRAM (TEE);  Surgeon: [name], MD;  Location: [place];  Service: Cardiovascular;  Laterality: [name];   TEE WITHOUT CARDIOVERSION [name] 11/01/2014   Procedure: TRANSESOPHAGEAL ECHOCARDIOGRAM (TEE);  Surgeon: Rexene Alberts, MD;  Location: [place];  Service: Open Heart Surgery;  Laterality: [name];   TONSILLECTOMY     XI ROBOTIC ASSISTED INGUINAL HERNIA REPAIR WITH MESH Left 02/13/2020   Procedure: XI ROBOTIC ASSISTED INGUINAL HERNIA REPAIR WITH MESH;  Surgeon: [name], MD;  Location: [facility] ORS;  Service: General;  Laterality: Left;    Home Medications:  Allergies as of 08/04/2022   No Known Allergies      Medication List        Accurate as of August 04, 2022 11:59 PM. If you have any questions, ask your nurse or doctor.          AMBULATORY NON FORMULARY MEDICATION Trimix (30/1/10-(Pap/Phent/PGE) 42m will be given at office  [place] [phone] [phone] by: AHollice Espy MD   amLODipine 10 MG tablet Commonly known as: NORVASC Take 1 tablet (10 mg total) by mouth daily.   aspirin 81 MG tablet Take 1 tablet (81 mg total) by mouth daily.   cholecalciferol 25 MCG (1000 UNIT) tablet Commonly known as: VITAMIN D3 Take 1,000 Units by mouth at bedtime.   cyanocobalamin 2000 MCG tablet  Take 2,000 mcg by mouth at bedtime.   folic acid 694 MCG tablet Commonly known as: FOLVITE Take 400 mcg by mouth at bedtime.   HYDROcodone-acetaminophen 5-325 MG tablet Commonly known as: NORCO/VICODIN Take 1-2 tablets by mouth every 4 (four) hours as needed.   ibuprofen 600 MG tablet Commonly known as: ADVIL Take 1 tablet (600 mg total) by mouth every 6 (six) hours as needed.   levothyroxine 100  MCG tablet Commonly known as: SYNTHROID TAKE 1 TABLET (100 MCG TOTAL) BY MOUTH DAILY BEFORE BREAKFAST.   meloxicam 7.5 MG tablet Commonly known as: MOBIC Take 1 tablet (7.'5mg'$ ) by mouth daily as needed with food. No other NSAIDs.   metFORMIN 500 MG tablet Commonly known as: GLUCOPHAGE TAKE 1 TABLET BY MOUTH TWICE A DAY WITH MEALS   methocarbamol 500 MG tablet Commonly known as: ROBAXIN Take 1 tablet (500 mg total) by mouth 2 (two) times daily.   pyridOXINE 100 MG tablet Commonly known as: VITAMIN B6 Take 100 mg by mouth at bedtime.   rosuvastatin 20 MG tablet Commonly known as: CRESTOR Take 1 tablet (20 mg total) by mouth daily.   sertraline 25 MG tablet Commonly known as: ZOLOFT sertraline 25 mg tablet  TAKE 1 TABLETS BY ORAL ROUTE PER DAY IN THE MORNING   tadalafil 20 MG tablet Commonly known as: CIALIS 1-5 tablets one hour prior to intercourse   traZODone 50 MG tablet Commonly known as: DESYREL trazodone 50 mg tablet  TAKE 1/2 TABLET BY MOUTH AT BEDTIME        Allergies: No Known Allergies  Family History: Family History  Problem Relation Age of Onset   Heart disease Neg Hx     Social History:  reports that he has never smoked. He has never used smokeless tobacco. He reports current alcohol use. He reports that he does not use drugs.   Physical Exam: BP (!) 155/82   Pulse 83   Ht 6' (1.829 m)   Wt 184 lb (83.5 kg)   BMI 24.95 kg/m   Constitutional:  Alert and oriented, No acute distress. Neurologic: Grossly intact, no focal deficits, moving all 4 extremities. Psychiatric: Normal mood and affect.  Laboratory Data: Lab Results  Component Value Date   WBC 3.7 11/18/2020   HGB 13.7 12/22/2021   HCT 39.2 12/22/2021   MCV 90 11/18/2020   PLT 190 11/18/2020    Lab Results  Component Value Date   CREATININE 1.03 11/18/2020    Assessment & Plan:    1. Combined arterial insufficiency and corporo-venous occlusive erectile dysfunction We  discussed alternatives to PDE 5 inhibitors including intracavernosal injections, urethral suppositories although there is been a Psychologist, prison and probation services on this particular product, vacuum erection device and is inflatable penile prostheses.  After discussing each of these options, he is most interested in injection teaching.  We discussed our office protocol including obtaining a test dose file, return to the office for titration and teaching.  We sent this test dose to the pharmacy.  Given literature about inflatable penile prosthesis as well.  If he is interested in this, we will make the appropriate referral.  This would correct #1 and #2.  He would consider this at last resort.  2. Peyronie disease Based on patient's history of physical exam, findings are consistent with Peyronie's disease.  Pathophysiology was discussed at length including the 2 phases of the diease, acute and chronic.  Treatment options and goals of treatment were discussed today in detail. Options including observation,  penile plaque and graft, penile plication, placement of penile prosthesis, and injection of collagenase were all reviewed.  An benefits of each were discussed at length.  He is not interested in treatment, has minimal bother.   Return in about 4 weeks (around 09/01/2022) for Sam or [place] for trimix teaching.  Hollice Espy, MD  [facility] [address] [place], [place] [address] [phone]

## 2022-08-04 NOTE — Patient Instructions (Addendum)
Trimix Injection Training  The provider will prescribe the Trimix medication to [facility].  You will need to pick up the medication at:   [address]  [place], [place] [address] [phone]  Once you have the medication in hand you will need to call our office to schedule a morning ONLY appointment to have injection teaching. The medication will induce an erection. The medication is estimated at $80.00. This medication is time sensitive as it expires quickly.   You will need to have a normal blood pressure in order to be injected. Your blood pressure must be under 140/90. We will check your blood pressure multiple times to ensure the reading is correct, if you BP continues to be elevated we will need to reschedule your appointment.  
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
## 2022-09-28 ENCOUNTER — Ambulatory Visit: Payer: Medicare HMO | Admitting: Internal Medicine

## 2022-10-03 DIAGNOSIS — N486 Induration penis plastica: Secondary | ICD-10-CM | POA: Diagnosis not present

## 2022-10-03 DIAGNOSIS — E1169 Type 2 diabetes mellitus with other specified complication: Secondary | ICD-10-CM | POA: Diagnosis not present

## 2022-10-03 DIAGNOSIS — I10 Essential (primary) hypertension: Secondary | ICD-10-CM | POA: Diagnosis not present

## 2022-10-03 DIAGNOSIS — R69 Illness, unspecified: Secondary | ICD-10-CM | POA: Diagnosis not present

## 2022-10-03 DIAGNOSIS — E785 Hyperlipidemia, unspecified: Secondary | ICD-10-CM | POA: Diagnosis not present

## 2022-10-05 ENCOUNTER — Other Ambulatory Visit: Payer: Self-pay

## 2022-10-05 ENCOUNTER — Emergency Department (HOSPITAL_COMMUNITY)
Admission: EM | Admit: 2022-10-05 | Discharge: 2022-10-06 | Discharge status: Against Medical Advice | Payer: Medicare HMO | Attending: Emergency Medicine | Admitting: Emergency Medicine

## 2022-10-05 ENCOUNTER — Emergency Department (HOSPITAL_COMMUNITY): Payer: Medicare HMO

## 2022-10-05 ENCOUNTER — Encounter (HOSPITAL_COMMUNITY): Payer: Self-pay

## 2022-10-05 DIAGNOSIS — J189 Pneumonia, unspecified organism: Secondary | ICD-10-CM | POA: Diagnosis not present

## 2022-10-05 DIAGNOSIS — Z5321 Procedure and treatment not carried out due to patient leaving prior to being seen by health care provider: Secondary | ICD-10-CM | POA: Diagnosis not present

## 2022-10-05 DIAGNOSIS — R42 Dizziness and giddiness: Secondary | ICD-10-CM | POA: Diagnosis not present

## 2022-10-05 DIAGNOSIS — R55 Syncope and collapse: Secondary | ICD-10-CM | POA: Diagnosis not present

## 2022-10-05 DIAGNOSIS — Z743 Need for continuous supervision: Secondary | ICD-10-CM | POA: Diagnosis not present

## 2022-10-05 DIAGNOSIS — I10 Essential (primary) hypertension: Secondary | ICD-10-CM | POA: Diagnosis not present

## 2022-10-05 LAB — BASIC METABOLIC PANEL
Anion gap: 10 (ref 5–15)
BUN: 19 mg/dL (ref 8–23)
CO2: 25 mmol/L (ref 22–32)
Calcium: 8.9 mg/dL (ref 8.9–10.3)
Chloride: 102 mmol/L (ref 98–111)
Creatinine, Ser: 1.38 mg/dL — ABNORMAL HIGH (ref 0.61–1.24)
GFR, Estimated: 54 mL/min — ABNORMAL LOW (ref >60)
Glucose, Bld: 115 mg/dL — ABNORMAL HIGH (ref 70–99)
Potassium: 3.1 mmol/L — ABNORMAL LOW (ref 3.5–5.1)
Sodium: 137 mmol/L (ref 135–145)

## 2022-10-05 LAB — CBC WITH DIFFERENTIAL/PLATELET
Abs Immature Granulocytes: 0.03 10*3/uL (ref 0.00–0.07)
Basophils Absolute: 0 10*3/uL (ref 0.0–0.1)
Basophils Relative: 0 %
Eosinophils Absolute: 0 10*3/uL (ref 0.0–0.5)
Eosinophils Relative: 0 %
HCT: 40.3 % (ref 39.0–52.0)
Hemoglobin: 14 g/dL (ref 13.0–17.0)
Immature Granulocytes: 0 %
Lymphocytes Relative: 13 %
Lymphs Abs: 1.1 10*3/uL (ref 0.7–4.0)
MCH: 31.4 pg (ref 26.0–34.0)
MCHC: 34.7 g/dL (ref 30.0–36.0)
MCV: 90.4 fL (ref 80.0–100.0)
Monocytes Absolute: 1 10*3/uL (ref 0.1–1.0)
Monocytes Relative: 13 %
Neutro Abs: 5.9 10*3/uL (ref 1.7–7.7)
Neutrophils Relative %: 74 %
Platelets: 172 10*3/uL (ref 150–400)
RBC: 4.46 MIL/uL (ref 4.22–5.81)
RDW: 14 % (ref 11.5–15.5)
WBC: 8.1 10*3/uL (ref 4.0–10.5)
nRBC: 0 % (ref 0.0–0.2)

## 2022-10-05 NOTE — ED Notes (Signed)
Pt not responding for vitals 3x

## 2022-10-05 NOTE — ED Triage Notes (Signed)
Pt BIB [facility] from work c/o dizziness that started at 730 this morning that almost caused a syncopal episode, that has now resolved. Pt states his dr had him double his BP meds and today was the first day he did that. first initial BP was 110/60 and states that is low for him even with his meds.

## 2022-10-05 NOTE — ED Provider Triage Note (Signed)
Emergency Medicine Provider Triage Evaluation Note  Jeffrey Guerrero , a 71 y.o. male  was evaluated in triage.  History of pulmonic valve endocarditis and replacement.  Pt from work with [employer]. Pt complains of sudden dizziness, described as lightheadedness at 0730.  Continued for 30 minutes, EMS was called after he told coworker.  No CP, SOB. Symptoms now resolved. Was seen Saturday for PNA vaccine, provider increased HTN meds at that time. BP 110/60 with EMS. 12-lead with EMS with 1st degree block.   Review of Systems  Positive: Dizziness Negative: CP  Physical Exam  SpO2 97%  Gen:   Awake, no distress   Resp:  Normal effort  MSK:   Moves extremities without difficulty  Other:  Normal rate and regular rhythm, systolic crescendo murmur  Medical Decision Making  Medically screening exam initiated at 9:25 AM.  Appropriate orders placed.  Jeffrey Guerrero was informed that the remainder of the evaluation will be completed by another provider, this initial triage assessment does not replace that evaluation, and the importance of remaining in the ED until their evaluation is complete.     Carlisle Cater, PA-C 10/05/22 [phone]

## 2022-10-05 NOTE — ED Notes (Signed)
Pt not responding for vitals

## 2022-10-08 DIAGNOSIS — N528 Other male erectile dysfunction: Secondary | ICD-10-CM | POA: Diagnosis not present

## 2022-10-14 ENCOUNTER — Telehealth: Payer: Self-pay

## 2022-10-14 NOTE — Telephone Encounter (Signed)
        Patient  visited [place] on 12/19    Telephone encounter attempt :  1st  A HIPAA compliant voice message was left requesting a return call.  Instructed patient to call back     Jeffrey Guerrero [facility] Guide, [place], Care Management  [phone] [address], [place], [place] [address] Phone: [phone] Email: [email or web address]       

## 2022-10-15 ENCOUNTER — Telehealth: Payer: Self-pay

## 2022-10-15 NOTE — Telephone Encounter (Signed)
        Patient  visited [place] on 12/19   Telephone encounter attempt :  2nd  Unable to leave a message    Efton Thomley [facility] Care Guide, [place], Care Management  [phone] [address], [place], [place] [address] Phone: [phone] Email: [email or web address]       

## 2022-11-11 ENCOUNTER — Telehealth: Payer: Self-pay | Admitting: *Deleted

## 2022-11-11 NOTE — Patient Outreach (Signed)
  Care Coordination   11/11/2022 Name: Jeffrey Guerrero MRN: [id number] DOB: 1951-09-08   Care Coordination Outreach Attempts:  An unsuccessful telephone outreach was attempted today to offer the patient information about available care coordination services as a benefit of their health plan.   Follow Up Plan:  Additional outreach attempts will be made to offer the patient care coordination information and services.   Encounter Outcome:  No Answer   Care Coordination Interventions:  No, not indicated    Raina Mina, RN Care Management Coordinator [place] Office [phone]

## 2023-04-09 DIAGNOSIS — E785 Hyperlipidemia, unspecified: Secondary | ICD-10-CM | POA: Diagnosis not present

## 2023-04-09 DIAGNOSIS — E039 Hypothyroidism, unspecified: Secondary | ICD-10-CM | POA: Diagnosis not present

## 2023-04-09 DIAGNOSIS — I10 Essential (primary) hypertension: Secondary | ICD-10-CM | POA: Diagnosis not present

## 2023-04-09 DIAGNOSIS — E119 Type 2 diabetes mellitus without complications: Secondary | ICD-10-CM | POA: Diagnosis not present

## 2023-04-15 DIAGNOSIS — E039 Hypothyroidism, unspecified: Secondary | ICD-10-CM | POA: Diagnosis not present

## 2023-04-15 DIAGNOSIS — I10 Essential (primary) hypertension: Secondary | ICD-10-CM | POA: Diagnosis not present

## 2023-04-15 DIAGNOSIS — E119 Type 2 diabetes mellitus without complications: Secondary | ICD-10-CM | POA: Diagnosis not present

## 2023-04-15 DIAGNOSIS — E785 Hyperlipidemia, unspecified: Secondary | ICD-10-CM | POA: Diagnosis not present

## 2023-08-13 DIAGNOSIS — E119 Type 2 diabetes mellitus without complications: Secondary | ICD-10-CM | POA: Diagnosis not present

## 2023-08-30 DIAGNOSIS — Z136 Encounter for screening for cardiovascular disorders: Secondary | ICD-10-CM | POA: Diagnosis not present

## 2023-08-30 DIAGNOSIS — Z7189 Other specified counseling: Secondary | ICD-10-CM | POA: Diagnosis not present

## 2023-08-30 DIAGNOSIS — Z9181 History of falling: Secondary | ICD-10-CM | POA: Diagnosis not present

## 2023-08-30 DIAGNOSIS — Z0000 Encounter for general adult medical examination without abnormal findings: Secondary | ICD-10-CM | POA: Diagnosis not present

## 2023-08-30 DIAGNOSIS — Z1331 Encounter for screening for depression: Secondary | ICD-10-CM | POA: Diagnosis not present

## 2023-08-30 DIAGNOSIS — Z1211 Encounter for screening for malignant neoplasm of colon: Secondary | ICD-10-CM | POA: Diagnosis not present

## 2023-08-30 DIAGNOSIS — Z1339 Encounter for screening examination for other mental health and behavioral disorders: Secondary | ICD-10-CM | POA: Diagnosis not present

## 2023-08-30 DIAGNOSIS — Z13820 Encounter for screening for osteoporosis: Secondary | ICD-10-CM | POA: Diagnosis not present

## 2023-09-13 DIAGNOSIS — E119 Type 2 diabetes mellitus without complications: Secondary | ICD-10-CM | POA: Diagnosis not present

## 2023-09-13 DIAGNOSIS — H524 Presbyopia: Secondary | ICD-10-CM | POA: Diagnosis not present

## 2023-09-13 DIAGNOSIS — H25013 Cortical age-related cataract, bilateral: Secondary | ICD-10-CM | POA: Diagnosis not present

## 2023-09-13 DIAGNOSIS — H5203 Hypermetropia, bilateral: Secondary | ICD-10-CM | POA: Diagnosis not present

## 2023-09-13 DIAGNOSIS — H2513 Age-related nuclear cataract, bilateral: Secondary | ICD-10-CM | POA: Diagnosis not present

## 2024-04-28 DIAGNOSIS — E119 Type 2 diabetes mellitus without complications: Secondary | ICD-10-CM | POA: Diagnosis not present

## 2024-04-28 DIAGNOSIS — E039 Hypothyroidism, unspecified: Secondary | ICD-10-CM | POA: Diagnosis not present

## 2024-04-28 DIAGNOSIS — E785 Hyperlipidemia, unspecified: Secondary | ICD-10-CM | POA: Diagnosis not present

## 2024-04-28 DIAGNOSIS — I10 Essential (primary) hypertension: Secondary | ICD-10-CM | POA: Diagnosis not present

## 2024-09-12 DIAGNOSIS — H2513 Age-related nuclear cataract, bilateral: Secondary | ICD-10-CM | POA: Diagnosis not present

## 2024-09-12 DIAGNOSIS — E119 Type 2 diabetes mellitus without complications: Secondary | ICD-10-CM | POA: Diagnosis not present

## 2024-09-12 DIAGNOSIS — H524 Presbyopia: Secondary | ICD-10-CM | POA: Diagnosis not present

## 2024-09-12 DIAGNOSIS — H5203 Hypermetropia, bilateral: Secondary | ICD-10-CM | POA: Diagnosis not present

## 2024-09-12 DIAGNOSIS — H25013 Cortical age-related cataract, bilateral: Secondary | ICD-10-CM | POA: Diagnosis not present

## 2024-09-29 DIAGNOSIS — I10 Essential (primary) hypertension: Secondary | ICD-10-CM | POA: Diagnosis not present

## 2024-09-29 DIAGNOSIS — E559 Vitamin D deficiency, unspecified: Secondary | ICD-10-CM | POA: Diagnosis not present

## 2024-09-29 DIAGNOSIS — E663 Overweight: Secondary | ICD-10-CM | POA: Diagnosis not present

## 2024-09-29 DIAGNOSIS — Z6827 Body mass index (BMI) 27.0-27.9, adult: Secondary | ICD-10-CM | POA: Diagnosis not present

## 2024-09-29 DIAGNOSIS — E039 Hypothyroidism, unspecified: Secondary | ICD-10-CM | POA: Diagnosis not present

## 2024-09-29 DIAGNOSIS — E119 Type 2 diabetes mellitus without complications: Secondary | ICD-10-CM | POA: Diagnosis not present

## 2024-10-04 DIAGNOSIS — Z9181 History of falling: Secondary | ICD-10-CM | POA: Diagnosis not present

## 2024-10-04 DIAGNOSIS — Z1211 Encounter for screening for malignant neoplasm of colon: Secondary | ICD-10-CM | POA: Diagnosis not present

## 2024-10-04 DIAGNOSIS — Z1331 Encounter for screening for depression: Secondary | ICD-10-CM | POA: Diagnosis not present

## 2024-10-04 DIAGNOSIS — Z0000 Encounter for general adult medical examination without abnormal findings: Secondary | ICD-10-CM | POA: Diagnosis not present
